# Patient Record
Sex: Female | Born: 1945 | Race: White | Hispanic: No | Marital: Married | Smoking: Never smoker
Health system: Southern US, Community
[De-identification: ages and names within clinical notes are randomized; demographics above are authoritative.]

## PROBLEM LIST (undated history)

## (undated) DIAGNOSIS — J019 Acute sinusitis, unspecified: Secondary | ICD-10-CM

## (undated) DIAGNOSIS — F419 Anxiety disorder, unspecified: Secondary | ICD-10-CM

## (undated) DIAGNOSIS — N63 Unspecified lump in unspecified breast: Secondary | ICD-10-CM

## (undated) DIAGNOSIS — E663 Overweight: Secondary | ICD-10-CM

## (undated) DIAGNOSIS — E039 Hypothyroidism, unspecified: Secondary | ICD-10-CM

## (undated) DIAGNOSIS — D126 Benign neoplasm of colon, unspecified: Secondary | ICD-10-CM

## (undated) DIAGNOSIS — J069 Acute upper respiratory infection, unspecified: Secondary | ICD-10-CM

## (undated) DIAGNOSIS — H269 Unspecified cataract: Secondary | ICD-10-CM

## (undated) DIAGNOSIS — E785 Hyperlipidemia, unspecified: Secondary | ICD-10-CM

## (undated) DIAGNOSIS — K5730 Diverticulosis of large intestine without perforation or abscess without bleeding: Secondary | ICD-10-CM

## (undated) DIAGNOSIS — R05 Cough: Secondary | ICD-10-CM

## (undated) DIAGNOSIS — F341 Dysthymic disorder: Secondary | ICD-10-CM

## (undated) DIAGNOSIS — K589 Irritable bowel syndrome without diarrhea: Secondary | ICD-10-CM

## (undated) DIAGNOSIS — I10 Essential (primary) hypertension: Secondary | ICD-10-CM

## (undated) HISTORY — DX: Anxiety disorder, unspecified: F41.9

## (undated) HISTORY — DX: Hypothyroidism, unspecified: E03.9

## (undated) HISTORY — DX: Benign neoplasm of colon, unspecified: D12.6

## (undated) HISTORY — DX: Unspecified cataract: H26.9

## (undated) HISTORY — DX: Dysthymic disorder: F34.1

## (undated) HISTORY — PX: TONSILLECTOMY: SHX5217

## (undated) HISTORY — DX: Hyperlipidemia, unspecified: E78.5

## (undated) HISTORY — PX: EYE SURGERY: SHX253

## (undated) HISTORY — DX: Irritable bowel syndrome without diarrhea: K58.9

## (undated) HISTORY — PX: ABDOMINAL HYSTERECTOMY: SHX81

## (undated) HISTORY — DX: Diverticulosis of large intestine without perforation or abscess without bleeding: K57.30

## (undated) HISTORY — DX: Essential (primary) hypertension: I10

## (undated) HISTORY — DX: Overweight: E66.3

## (undated) HISTORY — DX: Acute sinusitis, unspecified: J01.90

## (undated) HISTORY — DX: Cough: R05

## (undated) HISTORY — DX: Acute upper respiratory infection, unspecified: J06.9

## (undated) HISTORY — DX: Unspecified lump in unspecified breast: N63.0

---

## 1970-10-14 HISTORY — PX: LAPAROSCOPY: SHX197

## 1975-10-15 HISTORY — PX: CERVICAL CONE BIOPSY: SUR198

## 1982-10-14 HISTORY — PX: BREAST SURGERY: SHX581

## 1982-10-14 HISTORY — PX: BREAST EXCISIONAL BIOPSY: SUR124

## 1998-08-30 ENCOUNTER — Other Ambulatory Visit: Admission: RE | Admit: 1998-08-30 | Discharge: 1998-08-30 | Payer: Self-pay | Admitting: *Deleted

## 1999-09-05 ENCOUNTER — Other Ambulatory Visit: Admission: RE | Admit: 1999-09-05 | Discharge: 1999-09-05 | Payer: Self-pay | Admitting: *Deleted

## 2000-05-23 ENCOUNTER — Encounter: Admission: RE | Admit: 2000-05-23 | Discharge: 2000-05-23 | Payer: Self-pay | Admitting: Internal Medicine

## 2000-05-23 ENCOUNTER — Encounter: Payer: Self-pay | Admitting: Internal Medicine

## 2000-09-17 ENCOUNTER — Other Ambulatory Visit: Admission: RE | Admit: 2000-09-17 | Discharge: 2000-09-17 | Payer: Self-pay | Admitting: *Deleted

## 2000-11-19 ENCOUNTER — Encounter: Payer: Self-pay | Admitting: Otolaryngology

## 2000-11-19 ENCOUNTER — Encounter: Admission: RE | Admit: 2000-11-19 | Discharge: 2000-11-19 | Payer: Self-pay | Admitting: Otolaryngology

## 2001-05-25 ENCOUNTER — Encounter: Payer: Self-pay | Admitting: Internal Medicine

## 2001-05-25 ENCOUNTER — Encounter: Admission: RE | Admit: 2001-05-25 | Discharge: 2001-05-25 | Payer: Self-pay | Admitting: Internal Medicine

## 2001-10-09 ENCOUNTER — Other Ambulatory Visit: Admission: RE | Admit: 2001-10-09 | Discharge: 2001-10-09 | Payer: Self-pay | Admitting: *Deleted

## 2002-04-12 ENCOUNTER — Ambulatory Visit (HOSPITAL_COMMUNITY): Admission: RE | Admit: 2002-04-12 | Discharge: 2002-04-12 | Payer: Self-pay | Admitting: Gastroenterology

## 2002-04-12 ENCOUNTER — Encounter: Payer: Self-pay | Admitting: Gastroenterology

## 2002-06-30 ENCOUNTER — Encounter: Payer: Self-pay | Admitting: Internal Medicine

## 2002-06-30 ENCOUNTER — Encounter: Admission: RE | Admit: 2002-06-30 | Discharge: 2002-06-30 | Payer: Self-pay | Admitting: Internal Medicine

## 2002-10-18 ENCOUNTER — Other Ambulatory Visit: Admission: RE | Admit: 2002-10-18 | Discharge: 2002-10-18 | Payer: Self-pay | Admitting: *Deleted

## 2003-02-13 ENCOUNTER — Encounter: Payer: Self-pay | Admitting: Orthopaedic Surgery

## 2003-02-13 ENCOUNTER — Encounter: Admission: RE | Admit: 2003-02-13 | Discharge: 2003-02-13 | Payer: Self-pay | Admitting: Orthopaedic Surgery

## 2003-07-04 ENCOUNTER — Encounter: Payer: Self-pay | Admitting: Internal Medicine

## 2003-07-04 ENCOUNTER — Encounter: Admission: RE | Admit: 2003-07-04 | Discharge: 2003-07-04 | Payer: Self-pay | Admitting: Internal Medicine

## 2003-10-20 ENCOUNTER — Other Ambulatory Visit: Admission: RE | Admit: 2003-10-20 | Discharge: 2003-10-20 | Payer: Self-pay | Admitting: *Deleted

## 2003-12-14 ENCOUNTER — Encounter: Admission: RE | Admit: 2003-12-14 | Discharge: 2003-12-14 | Payer: Self-pay | Admitting: *Deleted

## 2004-07-05 ENCOUNTER — Encounter: Admission: RE | Admit: 2004-07-05 | Discharge: 2004-07-05 | Payer: Self-pay | Admitting: Internal Medicine

## 2004-10-01 ENCOUNTER — Ambulatory Visit: Payer: Self-pay | Admitting: Internal Medicine

## 2004-10-14 HISTORY — PX: SKIN LESION EXCISION: SHX2412

## 2004-10-25 ENCOUNTER — Other Ambulatory Visit: Admission: RE | Admit: 2004-10-25 | Discharge: 2004-10-25 | Payer: Self-pay | Admitting: *Deleted

## 2005-04-12 ENCOUNTER — Ambulatory Visit: Payer: Self-pay | Admitting: Internal Medicine

## 2005-04-18 ENCOUNTER — Ambulatory Visit: Payer: Self-pay | Admitting: Internal Medicine

## 2005-07-24 ENCOUNTER — Encounter: Admission: RE | Admit: 2005-07-24 | Discharge: 2005-07-24 | Payer: Self-pay | Admitting: Internal Medicine

## 2005-08-05 ENCOUNTER — Encounter: Admission: RE | Admit: 2005-08-05 | Discharge: 2005-08-05 | Payer: Self-pay | Admitting: Internal Medicine

## 2005-08-19 ENCOUNTER — Ambulatory Visit: Payer: Self-pay | Admitting: Internal Medicine

## 2005-10-28 ENCOUNTER — Other Ambulatory Visit: Admission: RE | Admit: 2005-10-28 | Discharge: 2005-10-28 | Payer: Self-pay | Admitting: Obstetrics & Gynecology

## 2005-10-28 ENCOUNTER — Other Ambulatory Visit: Admission: RE | Admit: 2005-10-28 | Discharge: 2005-10-28 | Payer: Self-pay | Admitting: Obstetrics and Gynecology

## 2005-11-12 ENCOUNTER — Ambulatory Visit: Payer: Self-pay | Admitting: Internal Medicine

## 2005-12-07 ENCOUNTER — Ambulatory Visit: Payer: Self-pay | Admitting: Family Medicine

## 2006-01-14 ENCOUNTER — Encounter: Admission: RE | Admit: 2006-01-14 | Discharge: 2006-01-14 | Payer: Self-pay | Admitting: Internal Medicine

## 2006-05-21 ENCOUNTER — Ambulatory Visit: Payer: Self-pay | Admitting: Internal Medicine

## 2006-05-27 ENCOUNTER — Ambulatory Visit: Payer: Self-pay | Admitting: Internal Medicine

## 2006-07-25 ENCOUNTER — Encounter: Admission: RE | Admit: 2006-07-25 | Discharge: 2006-07-25 | Payer: Self-pay | Admitting: Internal Medicine

## 2006-10-30 ENCOUNTER — Other Ambulatory Visit: Admission: RE | Admit: 2006-10-30 | Discharge: 2006-10-30 | Payer: Self-pay | Admitting: Obstetrics and Gynecology

## 2006-12-13 ENCOUNTER — Ambulatory Visit: Payer: Self-pay | Admitting: Internal Medicine

## 2007-03-31 ENCOUNTER — Ambulatory Visit: Payer: Self-pay | Admitting: Gastroenterology

## 2007-04-01 ENCOUNTER — Ambulatory Visit: Payer: Self-pay | Admitting: Gastroenterology

## 2007-04-01 ENCOUNTER — Encounter: Payer: Self-pay | Admitting: Gastroenterology

## 2007-05-26 ENCOUNTER — Ambulatory Visit: Payer: Self-pay | Admitting: Internal Medicine

## 2007-05-26 LAB — CONVERTED CEMR LAB
Basophils Absolute: 0.1 10*3/uL (ref 0.0–0.1)
Bilirubin Urine: NEGATIVE
CO2: 28 meq/L (ref 19–32)
Calcium: 9 mg/dL (ref 8.4–10.5)
GFR calc Af Amer: 95 mL/min
HDL: 40.1 mg/dL (ref >39.0)
Hemoglobin, Urine: NEGATIVE
Hemoglobin: 14.2 g/dL (ref 12.0–15.0)
Ketones, ur: NEGATIVE mg/dL
MCHC: 35.4 g/dL (ref 30.0–36.0)
Monocytes Absolute: 0.6 10*3/uL (ref 0.2–0.7)
Neutro Abs: 3.3 10*3/uL (ref 1.4–7.7)
Neutrophils Relative %: 42.4 % — ABNORMAL LOW (ref 43.0–77.0)
Nitrite: NEGATIVE
Sodium: 144 meq/L (ref 135–145)
Specific Gravity, Urine: 1.02 (ref 1.000–1.03)
TSH: 0.52 microintl units/mL (ref 0.35–5.50)
Total Bilirubin: 0.6 mg/dL (ref 0.3–1.2)
Total CHOL/HDL Ratio: 3.5
Total Protein: 6.5 g/dL (ref 6.0–8.3)
Urine Glucose: NEGATIVE mg/dL
Urobilinogen, UA: 0.2 (ref 0.0–1.0)
VLDL: 31 mg/dL (ref 0–40)
pH: 6 (ref 5.0–8.0)

## 2007-05-29 ENCOUNTER — Ambulatory Visit: Payer: Self-pay | Admitting: Internal Medicine

## 2007-07-27 ENCOUNTER — Encounter: Admission: RE | Admit: 2007-07-27 | Discharge: 2007-07-27 | Payer: Self-pay | Admitting: Internal Medicine

## 2007-10-02 ENCOUNTER — Ambulatory Visit: Payer: Self-pay | Admitting: Internal Medicine

## 2007-10-15 LAB — CONVERTED CEMR LAB

## 2007-11-02 ENCOUNTER — Other Ambulatory Visit: Admission: RE | Admit: 2007-11-02 | Discharge: 2007-11-02 | Payer: Self-pay | Admitting: Obstetrics & Gynecology

## 2007-11-30 ENCOUNTER — Telehealth: Payer: Self-pay | Admitting: Internal Medicine

## 2008-01-05 ENCOUNTER — Ambulatory Visit: Payer: Self-pay | Admitting: Internal Medicine

## 2008-01-18 ENCOUNTER — Ambulatory Visit: Payer: Self-pay | Admitting: Internal Medicine

## 2008-02-06 DIAGNOSIS — K589 Irritable bowel syndrome without diarrhea: Secondary | ICD-10-CM

## 2008-02-06 DIAGNOSIS — N63 Unspecified lump in unspecified breast: Secondary | ICD-10-CM

## 2008-02-06 DIAGNOSIS — I10 Essential (primary) hypertension: Secondary | ICD-10-CM

## 2008-02-06 DIAGNOSIS — F341 Dysthymic disorder: Secondary | ICD-10-CM

## 2008-02-06 DIAGNOSIS — K5730 Diverticulosis of large intestine without perforation or abscess without bleeding: Secondary | ICD-10-CM

## 2008-02-06 DIAGNOSIS — F329 Major depressive disorder, single episode, unspecified: Secondary | ICD-10-CM

## 2008-02-06 DIAGNOSIS — E039 Hypothyroidism, unspecified: Secondary | ICD-10-CM

## 2008-02-06 DIAGNOSIS — F32A Depression, unspecified: Secondary | ICD-10-CM | POA: Insufficient documentation

## 2008-02-06 HISTORY — DX: Hypothyroidism, unspecified: E03.9

## 2008-02-06 HISTORY — DX: Essential (primary) hypertension: I10

## 2008-02-06 HISTORY — DX: Dysthymic disorder: F34.1

## 2008-02-06 HISTORY — DX: Irritable bowel syndrome, unspecified: K58.9

## 2008-02-06 HISTORY — DX: Diverticulosis of large intestine without perforation or abscess without bleeding: K57.30

## 2008-02-06 HISTORY — DX: Unspecified lump in unspecified breast: N63.0

## 2008-06-21 ENCOUNTER — Telehealth: Payer: Self-pay | Admitting: Internal Medicine

## 2008-06-21 ENCOUNTER — Ambulatory Visit: Payer: Self-pay | Admitting: Internal Medicine

## 2008-06-21 LAB — CONVERTED CEMR LAB
Basophils Absolute: 0 10*3/uL (ref 0.0–0.1)
Basophils Relative: 0.6 % (ref 0.0–3.0)
CO2: 29 meq/L (ref 19–32)
Chloride: 108 meq/L (ref 96–112)
Eosinophils Absolute: 0.1 10*3/uL (ref 0.0–0.7)
Eosinophils Relative: 1.6 % (ref 0.0–5.0)
GFR calc non Af Amer: 68 mL/min
HCT: 41.9 % (ref 36.0–46.0)
Hemoglobin, Urine: NEGATIVE
Hemoglobin: 14.9 g/dL (ref 12.0–15.0)
LDL Cholesterol: 60 mg/dL (ref 0–99)
MCV: 89.2 fL (ref 78.0–100.0)
Neutro Abs: 4.8 10*3/uL (ref 1.4–7.7)
Neutrophils Relative %: 55.9 % (ref 43.0–77.0)
Nitrite: NEGATIVE
Potassium: 3.6 meq/L (ref 3.5–5.1)
RBC: 4.7 M/uL (ref 3.87–5.11)
RDW: 13.6 % (ref 11.5–14.6)
Sodium: 145 meq/L (ref 135–145)
Total Bilirubin: 1 mg/dL (ref 0.3–1.2)
Urine Glucose: NEGATIVE mg/dL
Urobilinogen, UA: 0.2 (ref 0.0–1.0)
pH: 6 (ref 5.0–8.0)

## 2008-06-27 ENCOUNTER — Ambulatory Visit: Payer: Self-pay | Admitting: Internal Medicine

## 2008-06-27 DIAGNOSIS — E663 Overweight: Secondary | ICD-10-CM

## 2008-06-27 DIAGNOSIS — E785 Hyperlipidemia, unspecified: Secondary | ICD-10-CM | POA: Insufficient documentation

## 2008-06-27 DIAGNOSIS — E669 Obesity, unspecified: Secondary | ICD-10-CM | POA: Insufficient documentation

## 2008-06-27 HISTORY — DX: Overweight: E66.3

## 2008-06-27 HISTORY — DX: Hyperlipidemia, unspecified: E78.5

## 2008-07-27 ENCOUNTER — Encounter: Admission: RE | Admit: 2008-07-27 | Discharge: 2008-07-27 | Payer: Self-pay | Admitting: Internal Medicine

## 2008-09-27 ENCOUNTER — Ambulatory Visit: Payer: Self-pay | Admitting: Internal Medicine

## 2008-09-27 DIAGNOSIS — R059 Cough, unspecified: Secondary | ICD-10-CM | POA: Insufficient documentation

## 2008-09-27 DIAGNOSIS — R05 Cough: Secondary | ICD-10-CM

## 2008-09-27 HISTORY — DX: Cough, unspecified: R05.9

## 2008-10-14 LAB — CONVERTED CEMR LAB: Pap Smear: NORMAL

## 2008-11-10 ENCOUNTER — Other Ambulatory Visit: Admission: RE | Admit: 2008-11-10 | Discharge: 2008-11-10 | Payer: Self-pay | Admitting: Obstetrics and Gynecology

## 2008-12-08 ENCOUNTER — Telehealth: Payer: Self-pay | Admitting: Internal Medicine

## 2009-03-29 ENCOUNTER — Telehealth: Payer: Self-pay | Admitting: Internal Medicine

## 2009-04-03 ENCOUNTER — Encounter: Admission: RE | Admit: 2009-04-03 | Discharge: 2009-04-03 | Payer: Self-pay | Admitting: Obstetrics & Gynecology

## 2009-05-30 ENCOUNTER — Telehealth: Payer: Self-pay | Admitting: Internal Medicine

## 2009-05-31 ENCOUNTER — Ambulatory Visit: Payer: Self-pay | Admitting: Internal Medicine

## 2009-06-21 ENCOUNTER — Ambulatory Visit: Payer: Self-pay | Admitting: Internal Medicine

## 2009-06-21 LAB — CONVERTED CEMR LAB
ALT: 14 units/L (ref 0–35)
AST: 23 units/L (ref 0–37)
Albumin: 3.8 g/dL (ref 3.5–5.2)
BUN: 16 mg/dL (ref 6–23)
Basophils Absolute: 0.1 10*3/uL (ref 0.0–0.1)
Basophils Relative: 1.2 % (ref 0.0–3.0)
Bilirubin, Direct: 0.1 mg/dL (ref 0.0–0.3)
Calcium: 9.3 mg/dL (ref 8.4–10.5)
Cholesterol: 145 mg/dL (ref 0–200)
Eosinophils Absolute: 0.2 10*3/uL (ref 0.0–0.7)
Glucose, Bld: 92 mg/dL (ref 70–99)
HDL: 46.3 mg/dL (ref >39.00)
Hemoglobin, Urine: NEGATIVE
Ketones, ur: NEGATIVE mg/dL
Lymphs Abs: 3.2 10*3/uL (ref 0.7–4.0)
MCHC: 33.7 g/dL (ref 30.0–36.0)
MCV: 93.2 fL (ref 78.0–100.0)
Monocytes Absolute: 0.6 10*3/uL (ref 0.1–1.0)
Neutrophils Relative %: 49.2 % (ref 43.0–77.0)
Nitrite: NEGATIVE
TSH: 2.18 microintl units/mL (ref 0.35–5.50)
Total Bilirubin: 0.9 mg/dL (ref 0.3–1.2)
Total CHOL/HDL Ratio: 3
Total Protein, Urine: NEGATIVE mg/dL
Urobilinogen, UA: 0.2 (ref 0.0–1.0)
Vitamin B-12: 431 pg/mL (ref 211–911)
WBC: 8.2 10*3/uL (ref 4.5–10.5)
pH: 6 (ref 5.0–8.0)

## 2009-06-23 ENCOUNTER — Ambulatory Visit: Payer: Self-pay | Admitting: Internal Medicine

## 2009-06-29 ENCOUNTER — Ambulatory Visit: Payer: Self-pay | Admitting: Internal Medicine

## 2009-07-31 ENCOUNTER — Encounter: Admission: RE | Admit: 2009-07-31 | Discharge: 2009-07-31 | Payer: Self-pay | Admitting: Internal Medicine

## 2009-09-04 ENCOUNTER — Ambulatory Visit: Payer: Self-pay | Admitting: Internal Medicine

## 2009-09-04 DIAGNOSIS — J069 Acute upper respiratory infection, unspecified: Secondary | ICD-10-CM | POA: Insufficient documentation

## 2009-09-04 HISTORY — DX: Acute upper respiratory infection, unspecified: J06.9

## 2009-11-20 ENCOUNTER — Telehealth: Payer: Self-pay | Admitting: Internal Medicine

## 2010-01-04 ENCOUNTER — Ambulatory Visit: Payer: Self-pay | Admitting: Internal Medicine

## 2010-01-04 DIAGNOSIS — J019 Acute sinusitis, unspecified: Secondary | ICD-10-CM

## 2010-01-04 HISTORY — DX: Acute sinusitis, unspecified: J01.90

## 2010-02-06 ENCOUNTER — Ambulatory Visit: Payer: Self-pay | Admitting: Internal Medicine

## 2010-02-13 ENCOUNTER — Encounter (INDEPENDENT_AMBULATORY_CARE_PROVIDER_SITE_OTHER): Payer: Self-pay | Admitting: Obstetrics and Gynecology

## 2010-02-13 ENCOUNTER — Encounter: Payer: Self-pay | Admitting: Internal Medicine

## 2010-02-13 ENCOUNTER — Inpatient Hospital Stay (HOSPITAL_COMMUNITY): Admission: RE | Admit: 2010-02-13 | Discharge: 2010-02-14 | Payer: Self-pay | Admitting: Obstetrics and Gynecology

## 2010-02-19 ENCOUNTER — Telehealth: Payer: Self-pay | Admitting: Internal Medicine

## 2010-03-01 ENCOUNTER — Encounter (INDEPENDENT_AMBULATORY_CARE_PROVIDER_SITE_OTHER): Payer: Self-pay | Admitting: *Deleted

## 2010-03-06 ENCOUNTER — Telehealth: Payer: Self-pay | Admitting: Gastroenterology

## 2010-03-15 ENCOUNTER — Telehealth: Payer: Self-pay | Admitting: Gastroenterology

## 2010-03-16 ENCOUNTER — Telehealth: Payer: Self-pay | Admitting: Internal Medicine

## 2010-03-16 DIAGNOSIS — D126 Benign neoplasm of colon, unspecified: Secondary | ICD-10-CM | POA: Insufficient documentation

## 2010-03-16 HISTORY — DX: Benign neoplasm of colon, unspecified: D12.6

## 2010-04-05 ENCOUNTER — Encounter: Payer: Self-pay | Admitting: Internal Medicine

## 2010-05-11 ENCOUNTER — Encounter: Payer: Self-pay | Admitting: Internal Medicine

## 2010-05-15 ENCOUNTER — Telehealth: Payer: Self-pay | Admitting: Internal Medicine

## 2010-05-22 ENCOUNTER — Telehealth: Payer: Self-pay | Admitting: Internal Medicine

## 2010-06-22 ENCOUNTER — Ambulatory Visit: Payer: Self-pay | Admitting: Internal Medicine

## 2010-06-22 LAB — CONVERTED CEMR LAB
Alkaline Phosphatase: 66 units/L (ref 39–117)
Cholesterol: 147 mg/dL (ref 0–200)
HDL: 40.6 mg/dL (ref >39.00)
TSH: 0.44 microintl units/mL (ref 0.35–5.50)
Total Protein: 7.1 g/dL (ref 6.0–8.3)
VLDL: 24.2 mg/dL (ref 0.0–40.0)

## 2010-06-24 ENCOUNTER — Encounter: Payer: Self-pay | Admitting: Internal Medicine

## 2010-08-01 ENCOUNTER — Encounter: Admission: RE | Admit: 2010-08-01 | Discharge: 2010-08-01 | Payer: Self-pay | Admitting: Internal Medicine

## 2010-08-21 ENCOUNTER — Ambulatory Visit: Payer: Self-pay | Admitting: Internal Medicine

## 2010-11-09 ENCOUNTER — Telehealth: Payer: Self-pay | Admitting: Internal Medicine

## 2010-11-15 NOTE — Progress Notes (Signed)
    Preventive Care Screening  Colonoscopy:    Date:  05/11/2010    Next Due:  05/2015    Results:  Adenomatous Polyp

## 2010-11-15 NOTE — Assessment & Plan Note (Signed)
Summary: SINUS PROBLEM  STC   Vital Signs:  Patient profile:   65 year old female Height:      62 inches Weight:      189 pounds BMI:     34.69 O2 Sat:      98 % on Room air Temp:     97.8 degrees F oral Pulse rate:   82 / minute BP sitting:   122 / 80  (left arm) Cuff size:   regular  Vitals Entered By: Alysia Penna (June 22, 2010 3:59 PM)  O2 Flow:  Room air CC: pt c/o sinus problems with chest congestion x 1 wk. /cp sma   Primary Care [place]:  [facility]  CC:  pt c/o sinus problems with chest congestion x 1 wk. /cp sma.  History of Present Illness: Having sinus congestion, green mucus, cough, sore throat, mild SOB. She has been takng otc meds for 1 week.   In the interval since her last visit she has had an abdominal hysterectomy which was uneventful Duard Larsen). she has had colonoscopy with Dr. Madilyn Fireman which went well  Current Medications (verified): 1)  Alprazolam 0.5 Mg Tabs (Alprazolam) .... Take 1 Tablet By Mouth Three Times A Day 2)  Bupropion Hcl 75 Mg Tabs (Bupropion Hcl) .... Take 1 Tablet By Mouth Twice A Day 3)  Hydrochlorothiazide 25 Mg Tabs (Hydrochlorothiazide) .... Take 1 Tablet By Mouth Once A Day 4)  Hyoscyamine Sulfate 0.125 Mg Subl (Hyoscyamine Sulfate) .... Take 1 Tablet By Mouth Twice A Day 5)  Lovastatin 40 Mg Tabs (Lovastatin) .... Take 1 Tablet By Mouth Every Night 6)  Naproxen 500 Mg Tabs (Naproxen) .... Take 1 Tablet By Mouth Twice A Day 7)  Synthroid 88 Mcg Tabs (Levothyroxine Sodium) .... Take 1 Tablet By Mouth Once A Day 8)  Fluticasone Propionate 50 Mcg/act Susp (Fluticasone Propionate) .Marland Kitchen.. 1 Spray / Nares Once A Day 9)  Adult Aspirin Low Strength 81 Mg  Tbdp (Aspirin) .... Take 1 Tablet By Mouth Once A Day 10)  Premarin 0.625 Mg/gm Crea (Estrogens, Conjugated)  Allergies (verified): No Known Drug Allergies  Past History:  Past Medical History: Last updated: 06/27/2008 DIVERTICULOSIS OF COLON (ICD-562.10) LUMP OR MASS IN  BREAST (ICD-611.72) HYPERTENSION (ICD-401.9) HYPOTHYROIDISM (ICD-244.9) ANXIETY DEPRESSION (ICD-300.4) IRRITABLE BOWEL SYNDROME (ICD-564.1) Congenital absence left kidney  Past Surgical History: Lumpectomy '84 right - benign Tonsillectomy laparoscopy '72 - fertility work-up Conization '77 excision of skin lesiion left breast '06 abdominal TAH/BSO May '11 (Brooks Silva)-fibroids, leiomyomata. PSH reviewed for relevance, FH reviewed for relevance  Review of Systems       The patient complains of fever, hoarseness, and prolonged cough.  The patient denies anorexia, weight loss, weight gain, decreased hearing, chest pain, dyspnea on exertion, abdominal pain, difficulty walking, and enlarged lymph nodes.    Physical Exam  General:  overweight white female in no distress Head:  normocephalic and atraumatic.  Minor sinus tenderness. Eyes:  C&S clear Ears:  TMs normal Neck:  supple.   Lungs:  normal respiratory effort, no intercostal retractions, no accessory muscle use, normal breath sounds, no crackles, and no wheezes.   Heart:  normal rate and regular rhythm.   Abdomen:  soft and normal bowel sounds.   Neurologic:  alert & oriented X3, cranial nerves II-XII intact, strength normal in all extremities, and gait normal.   Skin:  turgor normal and color normal.   Cervical Nodes:  no anterior cervical adenopathy and no posterior cervical adenopathy.  Psych:  normally interactive and good eye contact.     Impression & Recommendations:  Problem # 1:  SINUSITIS- ACUTE-NOS (ICD-461.9) Mild sinusitis.  Plan - Augmentin two times a day           supportive care.   The following medications were removed from the medication list:    Augmentin 875-125 Mg Tabs (Amoxicillin-pot clavulanate) .Marland Kitchen... Generic - 1 by mouth two times a day Her updated medication list for this problem includes:    Fluticasone Propionate 50 Mcg/act Susp (Fluticasone propionate) .Marland Kitchen... 1 spray / nares once a day     Amoxicillin-pot Clavulanate 875-125 Mg Tabs (Amoxicillin-pot clavulanate) .Marland Kitchen... 1 by mouth two times a day x 7 days for sinusitis  Problem # 2:  HYPERLIPIDEMIA (ICD-272.4) Due for routine lab follow-up with recommendations to follow under seperate cover.   Her updated medication list for this problem includes:    Lovastatin 40 Mg Tabs (Lovastatin) .Marland Kitchen... Take 1 tablet by mouth every night  Orders: TLB-Lipid Panel (80061-LIPID) TLB-Hepatic/Liver Function Pnl (80076-HEPATIC)  Complete Medication List: 1)  Alprazolam 0.5 Mg Tabs (Alprazolam) .... Take 1 tablet by mouth three times a day 2)  Bupropion Hcl 75 Mg Tabs (Bupropion hcl) .... Take 1 tablet by mouth twice a day 3)  Hydrochlorothiazide 25 Mg Tabs (Hydrochlorothiazide) .... Take 1 tablet by mouth once a day 4)  Hyoscyamine Sulfate 0.125 Mg Subl (Hyoscyamine sulfate) .... Take 1 tablet by mouth twice a day 5)  Lovastatin 40 Mg Tabs (Lovastatin) .... Take 1 tablet by mouth every night 6)  Naproxen 500 Mg Tabs (Naproxen) .... Take 1 tablet by mouth twice a day 7)  Synthroid 88 Mcg Tabs (Levothyroxine sodium) .... Take 1 tablet by mouth once a day 8)  Fluticasone Propionate 50 Mcg/act Susp (Fluticasone propionate) .Marland Kitchen.. 1 spray / nares once a day 9)  Adult Aspirin Low Strength 81 Mg Tbdp (Aspirin) .... Take 1 tablet by mouth once a day 10)  Premarin 0.625 Mg/gm Crea (Estrogens, conjugated) 11)  Amoxicillin-pot Clavulanate 875-125 Mg Tabs (Amoxicillin-pot clavulanate) .Marland Kitchen.. 1 by mouth two times a day x 7 days for sinusitis  Other Orders: TLB-TSH (Thyroid Stimulating Hormone) (84443-TSH)  Patient Instructions: 1)  sinus infection - plan: augmentin two times a day x 7 days; sudafed (generic) 30mg  three times a day; nasal saline; hydrate; vitamin C.  Prescriptions: FLUTICASONE PROPIONATE 50 MCG/ACT SUSP (FLUTICASONE PROPIONATE) 1 spray / nares once a day  #3 x 3   Entered and Authorized by:   Jacques Navy MD   Signed by:   Jacques Navy MD on 06/22/2010   Method used:   [facility] [phone]* (retail)       [address]       [place], [place]  [address]       Ph: [phone]       Fax: [phone]   RxID:   [id number] SYNTHROID 88 MCG TABS (LEVOTHYROXINE SODIUM) Take 1 tablet by mouth once a day  #90 x 3   Entered and Authorized by:   Jacques Navy MD   Signed by:   Jacques Navy MD on 06/22/2010   Method used:   [facility]ronically to        [facility] [phone]* (retail)       [address]       [place]  [place], [place]  [address]       Ph: [phone]       Fax: [phone]   RxID:   [id number] LOVASTATIN 40 MG TABS (LOVASTATIN) Take 1 tablet by mouth every night  #90 x 3   Entered and Authorized by:   Jacques Navy MD   Signed by:   Jacques Navy MD on 06/22/2010   Method used:   Electronically to        [facility] [phone]* (retail)       [address]       [place], [place]  [address]       Ph: [phone]       Fax: [phone]   RxID:   [id number] HYOSCYAMINE SULFATE 0.125 MG SUBL (HYOSCYAMINE SULFATE) Take 1 tablet by mouth twice a day  #180 x 3   Entered and Authorized by:   Jacques Navy MD   Signed by:   Jacques Navy MD on 06/22/2010   Method used:   Electronically to        [facility] [phone]* (retail)       [address]       [place], [place]  [address]       Ph: [phone]       Fax: [phone]   RxID:   [id number] HYDROCHLOROTHIAZIDE 25 MG TABS (HYDROCHLOROTHIAZIDE) Take 1 tablet by mouth once a day  #90 x 3   Entered and Authorized by:   Jacques Navy MD   Signed by:   Jacques Navy MD on 06/22/2010   Method used:   Electronically to        [facility] [phone]* (retail)       [address]       [place], [place]  [address]       Ph: [phone]       Fax: [phone]    RxID:   [id number] BUPROPION HCL 75 MG TABS (BUPROPION HCL) Take 1 tablet by mouth twice a day  #180 x 3   Entered and Authorized by:   Jacques Navy MD   Signed by:   Jacques Navy MD on 06/22/2010   Method used:   Electronically to        [facility] [phone]* (retail)       [address]       [place], [place]  [address]       Ph: [phone]       Fax: [phone]   RxID:   [id number] AMOXICILLIN-POT CLAVULANATE 875-125 MG TABS (AMOXICILLIN-POT CLAVULANATE) 1 by mouth two times a day x 7 days for sinusitis  #14 x 0   Entered and Authorized by:   Jacques Navy MD   Signed by:   Jacques Navy MD on 06/22/2010   Method used:   Electronically to        [facility] [phone]* (retail)       [address]       [place], [place]  [address]       Ph: [phone]       Fax: [phone]   RxID:   [id number]

## 2010-11-15 NOTE — Progress Notes (Signed)
  Phone Note Refill Request Message from:  Fax from Pharmacy on May 15, 2010 11:47 AM  Refills Requested: Medication #1:  ALPRAZOLAM 0.5 MG TABS Take 1 tablet by mouth three times a day   Last Refilled: 04/19/2010 [facility] please Advise refill  Initial call taken by: Ami Bullins CMA,  May 15, 2010 11:48 AM  Follow-up for Phone Call        ok for efill x 5 Follow-up by: Jacques Navy MD,  May 15, 2010 12:42 PM    Prescriptions: ALPRAZOLAM 0.5 MG TABS (ALPRAZOLAM) Take 1 tablet by mouth three times a day  #90 x 5   Entered by:   Ami Bullins CMA   Authorized by:   Jacques Navy MD   Signed by:   Bill Salinas CMA on 05/15/2010   Method used:   Telephoned to ...       [facility] [phone]* (retail)       [address]       [place], [place]  [address]       Ph: [phone]       Fax: [phone]   RxID:   [id number]

## 2010-11-15 NOTE — Procedures (Signed)
Summary: Colonoscopy / [facility]  Colonoscopy / [facility]   Imported By: Lennie Odor 05/25/2010 11:05:40  _____________________________________________________________________  External Attachment:    Type:   Image     Comment:   External Document

## 2010-11-15 NOTE — Letter (Signed)
Summary: Colonoscopy Letter   Gastroenterology  [address] [place], [place] [address]   Phone: [phone]  Fax: [phone]      Mar 01, 2010 MRN: [id number]   Brandi Ferguson [address] [place], [place]  [address]   Dear [name],   According to your medical record, it is time for you to schedule a Colonoscopy. The American Cancer Society recommends this procedure as a method to detect early colon cancer. Patients with a family history of colon cancer, or a personal history of colon polyps or inflammatory bowel disease are at increased risk.  This letter has beeen generated based on the recommendations made at the time of your procedure. If you feel that in your particular situation this may no longer apply, please contact our office.  Please call our office at [phone] to schedule this appointment or to update your records at your earliest convenience.  Thank you for cooperating with [place] to provide you with the very best care possible.   Sincerely,    Vania Rea. Jarold Motto, M.D.  [facility]astroenterology [facility] [phone]

## 2010-11-15 NOTE — Assessment & Plan Note (Signed)
Summary: SINUS INFECTION / SORE THROAT /NWS   Vital Signs:  Patient profile:   65 year old female Height:      62 inches Weight:      188 pounds BMI:     34.51 O2 Sat:      97 % on Room air Temp:     97.7 degrees F oral Pulse rate:   81 / minute BP sitting:   120 / 72  (left arm) Cuff size:   regular  Vitals Entered ByZella Ball Ewing (January 04, 2010 3:32 PM)  O2 Flow:  Room air CC: Sinus infection, sore throat, cough/RE   Primary Care Provider:  [facility]  CC:  Sinus infection, sore throat, and cough/RE.  History of Present Illness: here wtih 4 days onset facial pain, pressure, fever and greenish d/c, with mild ST, but Pt denies CP, sob, doe, wheezing, orthopnea, pnd, worsening LE edema, palps, dizziness or syncope Has mild headache but Pt denies new neuro symptoms such as headache, facial or extremity weakness   Problems Prior to Update: 1)  Uri  (ICD-465.9) 2)  Cough  (ICD-786.2) 3)  Routine General Medical Exam@[facility]  (ICD-V70.0) 4)  Hyperlipidemia  (ICD-272.4) 5)  Overweight  (ICD-278.02) 6)  Diverticulosis of Colon  (ICD-562.10) 7)  Lump or Mass in Breast  (ICD-611.72) 8)  Hypertension  (ICD-401.9) 9)  Hypothyroidism  (ICD-244.9) 10)  Anxiety Depression  (ICD-300.4) 11)  Irritable Bowel Syndrome  (ICD-564.1)  Medications Prior to Update: 1)  Alprazolam 0.5 Mg Tabs (Alprazolam) .... Take 1 Tablet By Mouth Three Times A Day 2)  Bupropion Hcl 75 Mg Tabs (Bupropion Hcl) .... Take 1 Tablet By Mouth Twice A Day 3)  Hydrochlorothiazide 25 Mg Tabs (Hydrochlorothiazide) .... Take 1 Tablet By Mouth Once A Day 4)  Hyoscyamine Sulfate 0.125 Mg Subl (Hyoscyamine Sulfate) .... Take 1 Tablet By Mouth Twice A Day 5)  Lovastatin 40 Mg Tabs (Lovastatin) .... Take 1 Tablet By Mouth Every Night 6)  Naproxen 500 Mg Tabs (Naproxen) .... Take 1 Tablet By Mouth Twice A Day 7)  Synthroid 88 Mcg Tabs (Levothyroxine Sodium) .... Take 1 Tablet By Mouth Once A Day 8)  Fluticasone  Propionate 50 Mcg/act Susp (Fluticasone Propionate) .Marland Kitchen.. 1 Spray / Nares Once A Day 9)  Adult Aspirin Low Strength 81 Mg  Tbdp (Aspirin) .... Take 1 Tablet By Mouth Once A Day 10)  Premarin 0.625 Mg/gm Crea (Estrogens, Conjugated) 11)  Azithromycin 250 Mg Tabs (Azithromycin) .... 2 Day #1, 1 Once Daily For 4 Days  Current Medications (verified): 1)  Alprazolam 0.5 Mg Tabs (Alprazolam) .... Take 1 Tablet By Mouth Three Times A Day 2)  Bupropion Hcl 75 Mg Tabs (Bupropion Hcl) .... Take 1 Tablet By Mouth Twice A Day 3)  Hydrochlorothiazide 25 Mg Tabs (Hydrochlorothiazide) .... Take 1 Tablet By Mouth Once A Day 4)  Hyoscyamine Sulfate 0.125 Mg Subl (Hyoscyamine Sulfate) .... Take 1 Tablet By Mouth Twice A Day 5)  Lovastatin 40 Mg Tabs (Lovastatin) .... Take 1 Tablet By Mouth Every Night 6)  Naproxen 500 Mg Tabs (Naproxen) .... Take 1 Tablet By Mouth Twice A Day 7)  Synthroid 88 Mcg Tabs (Levothyroxine Sodium) .... Take 1 Tablet By Mouth Once A Day 8)  Fluticasone Propionate 50 Mcg/act Susp (Fluticasone Propionate) .Marland Kitchen.. 1 Spray / Nares Once A Day 9)  Adult Aspirin Low Strength 81 Mg  Tbdp (Aspirin) .... Take 1 Tablet By Mouth Once A Day 10)  Premarin 0.625 Mg/gm Crea (  Estrogens, Conjugated) 11)  Augmentin 875-125 Mg Tabs (Amoxicillin-Pot Clavulanate) .... Generic - 1 By Mouth Two Times A Day  Allergies (verified): No Known Drug Allergies  Past History:  Past Medical History: Last updated: 06/27/2008 DIVERTICULOSIS OF COLON (ICD-562.10) LUMP OR MASS IN BREAST (ICD-611.72) HYPERTENSION (ICD-401.9) HYPOTHYROIDISM (ICD-244.9) ANXIETY DEPRESSION (ICD-300.4) IRRITABLE BOWEL SYNDROME (ICD-564.1) Congenital absence left kidney  Past Surgical History: Last updated: 06/27/2008 Lumpectomy '84 right - benign Tonsillectomy laparoscopy '72 - fertility work-up Conization '77 excision of skin lesiion left breast '06  Social History: Last updated: 01/05/2008 married '82 1 adopted daughter  '87 work: Tourist information centre manager marriage in [employer] health.  Risk Factors: Caffeine Use: 1 (06/27/2008) Diet: works on a helathy diet (06/29/2009) Exercise: no (06/29/2009)  Risk Factors: Smoking Status: never (06/29/2009)  Review of Systems       all otherwise negative per pt -    Physical Exam  General:  alert and overweight-appearing.  , mild ill  Head:  normocephalic and atraumatic.   Eyes:  vision grossly intact, pupils equal, and pupils round.   Ears:  bilat tm's red, sinus tender bilat  Nose:  nasal dischargemucosal pallor and mucosal edema.   Mouth:  pharyngeal erythema and fair dentition.   Neck:  supple and cervical lymphadenopathy.   Lungs:  normal respiratory effort and normal breath sounds.   Heart:  normal rate and regular rhythm.   Extremities:  no edema, no erythema    Impression & Recommendations:  Problem # 1:  SINUSITIS- ACUTE-NOS (ICD-461.9)  Her updated medication list for this problem includes:    Fluticasone Propionate 50 Mcg/act Susp (Fluticasone propionate) .Marland Kitchen... 1 spray / nares once a day    Augmentin 875-125 Mg Tabs (Amoxicillin-pot clavulanate) .Marland Kitchen... Generic - 1 by mouth two times a day treat as above, f/u any worsening signs or symptoms   Problem # 2:  HYPERTENSION (ICD-401.9)  Her updated medication list for this problem includes:    Hydrochlorothiazide 25 Mg Tabs (Hydrochlorothiazide) .Marland Kitchen... Take 1 tablet by mouth once a day  BP today: 120/72 Prior BP: 122/78 (09/04/2009)  Prior 10 Yr Risk Heart Disease: 6 % (05/31/2009)  Labs Reviewed: K+: 3.9 (06/21/2009) Creat: : 0.9 (06/21/2009)   Chol: 145 (06/21/2009)   HDL: 46.30 (06/21/2009)   LDL: 76 (06/21/2009)   TG: 113.0 (06/21/2009) stable overall by hx and exam, ok to continue meds/tx as is   Complete Medication List: 1)  Alprazolam 0.5 Mg Tabs (Alprazolam) .... Take 1 tablet by mouth three times a day 2)  Bupropion Hcl 75 Mg Tabs (Bupropion hcl) .... Take 1 tablet by mouth twice a  day 3)  Hydrochlorothiazide 25 Mg Tabs (Hydrochlorothiazide) .... Take 1 tablet by mouth once a day 4)  Hyoscyamine Sulfate 0.125 Mg Subl (Hyoscyamine sulfate) .... Take 1 tablet by mouth twice a day 5)  Lovastatin 40 Mg Tabs (Lovastatin) .... Take 1 tablet by mouth every night 6)  Naproxen 500 Mg Tabs (Naproxen) .... Take 1 tablet by mouth twice a day 7)  Synthroid 88 Mcg Tabs (Levothyroxine sodium) .... Take 1 tablet by mouth once a day 8)  Fluticasone Propionate 50 Mcg/act Susp (Fluticasone propionate) .Marland Kitchen.. 1 spray / nares once a day 9)  Adult Aspirin Low Strength 81 Mg Tbdp (Aspirin) .... Take 1 tablet by mouth once a day 10)  Premarin 0.625 Mg/gm Crea (Estrogens, conjugated) 11)  Augmentin 875-125 Mg Tabs (Amoxicillin-pot clavulanate) .... Generic - 1 by mouth two times a day  Patient Instructions: 1)  Please take all new medications as prescribed - the augmentin was sent to your pharmacy 2)  Continue all previous medications as before this visit  3)  You can also use Mucinex OTC or it's generic for congestion  4)  Please schedule an appointment with your primary doctor as needed Prescriptions: AUGMENTIN 875-125 MG TABS (AMOXICILLIN-POT CLAVULANATE) generic - 1 by mouth two times a day  #20 x 0   Entered and Authorized by:   Corwin Levins MD   Signed by:   Corwin Levins MD on 01/04/2010   Method used:   [facility]ronically to        [facility] [phone]* (retail)       [address]       [place], [place]  [address]       Ph: [phone]       Fax: [phone]   RxID:   [id number]

## 2010-11-15 NOTE — Progress Notes (Signed)
Summary: dr switch request   Phone Note Call from Patient Call back at Home Phone [phone]   Caller: Patient Call For: Dr. Marina Goodell Reason for Call: Talk to Nurse Summary of Call: pt would like to switch from Dr. Jarold Motto to Dr. Marina Goodell because her daughter has seen Dr. Marina Goodell in the hospital and she really liked him Initial call taken by: Vallarie Mare,  Mar 06, 2010 10:04 AM  Follow-up for Phone Call        this is fine---Dr. Marina Goodell is a fine Dr.Perry is an excellent Physician and will do a great job. Follow-up by: Mardella Layman MD Clementeen Graham,  Mar 06, 2010 11:50 AM    Additional Follow-up for Phone Call Additional follow up Details #2::    ok Follow-up by: Mardella Layman MD Clementeen Graham,  Mar 06, 2010 11:02 AM  Additional Follow-up for Phone Call Additional follow up Details #3:: Details for Additional Follow-up Action Taken: I appreciate the complement, but it is difficult for me to accommodate patients who have an established GI provider to my already full practice. Furthermore, Dr. Jarold Motto has performed multiple procedures on her and thus knows her best. Thanks anyway  Additional Follow-up by: Hilarie Fredrickson MD,  Mar 06, 2010 11:17 AM

## 2010-11-15 NOTE — Letter (Signed)
Summary: [facility]   Imported By: Sherian Rein 04/11/2010 11:51:06  _____________________________________________________________________  External Attachment:    Type:   Image     Comment:   External Document

## 2010-11-15 NOTE — Letter (Signed)
[facility] [address] [place], [place]  [address] Phone: [phone]      June 25, 2010   [place] [address] [place], [place] [address]  RE:  LAB RESULTS  Dear  Ms. SPRADLIN,  The following is an interpretation of your most recent lab tests.  Please take note of any instructions provided or changes to medications that have resulted from your lab work.  LIVER FUNCTION TESTS:  Good - no changes needed  LIPID PANEL:  Good - no changes needed Triglyceride: 121.0   Cholesterol: 147   LDL: 82   HDL: 40.60   Chol/HDL%:  4  THYROID STUDIES:  Thyroid studies normal TSH: 0.44      Labs look great. Hope your cold is better.   Sincerely Yours,    Jacques Navy MD  Patient: Brandi Ferguson Note: All result statuses are Final unless otherwise noted.  Tests: (1) Lipid Panel (LIPID)   Cholesterol               147 mg/dL                   2-956     ATP III Classification            Desirable:  < 200 mg/dL                    Borderline High:  200 - 239 mg/dL               High:  > = 240 mg/dL   Triglycerides             121.0 mg/dL                 2.1-308.6     Normal:  <150 mg/dL     Borderline High:  578 - 199 mg/dL   HDL                       46.96 mg/dL                 >29.52   VLDL Cholesterol          24.2 mg/dL                  8.4-13.2   LDL Cholesterol           82 mg/dL                    4-40  CHO/HDL Ratio:  CHD Risk                             4                    Men          Women     1/2 Average Risk     3.4          3.3     Average Risk          5.0          4.4     2X Average Risk          9.6          7.1     3X Average Risk          15.0  11.0                           Tests: (2) Hepatic/Liver Function Panel (HEPATIC)   Total Bilirubin           0.6 mg/dL                   1.6-1.0   Direct Bilirubin          0.1 mg/dL                   9.6-0.4   Alkaline Phosphatase      66 U/L                       39-117   AST                       27 U/L                      0-37   ALT                       24 U/L                      0-35   Total Protein             7.1 g/dL                    5.4-0.9   Albumin                   4.1 g/dL                    8.1-1.9  Tests: (3) TSH (TSH)   FastTSH                   0.44 uIU/mL                 0.35-5.50

## 2010-11-15 NOTE — Progress Notes (Signed)
Summary: increased bp  Phone Note Call from Patient Call back at Home Phone [phone]   Summary of Call: Patient left message on triage that last week she had a hysterectomy by Dr. Edward Jolly. Patient notes that her BP has been increased before, during, and after the surgery. Bystolic has been in 150's-170's and diastolic 80's-90's. Patient was unaware if her med (HCTZ) needs to be increased or changed, Please advise. Initial call taken by: Lucious Groves,  Feb 19, 2010 11:39 AM  Follow-up for Phone Call        called patient - no symptoms. Plan - have BP check at [facility] and get back to me if continues to be high.  Follow-up by: Jacques Navy MD,  Feb 19, 2010 6:16 PM

## 2010-11-15 NOTE — Progress Notes (Signed)
  Phone Note Refill Request Message from:  Fax from Pharmacy on November 09, 2010 10:23 AM  Refills Requested: Medication #1:  ALPRAZOLAM 0.5 MG TABS Take 1 tablet by mouth three times a day   Last Refilled: 10/13/2010 fax [facility] [address] Please Advise refills  Initial call taken by: Ami Bullins CMA,  November 09, 2010 10:24 AM    Prescriptions: ALPRAZOLAM 0.5 MG TABS (ALPRAZOLAM) Take 1 tablet by mouth three times a day  #90 x 5   Entered and Authorized by:   Jacques Navy MD   Signed by:   Jacques Navy MD on 11/09/2010   Method used:   Telephoned to ...       [facility] [phone][facility] (retail)       [address]       [place], [place]  [address]       Ph: [phone]       Fax: [phone]   RxID:   [id number]

## 2010-11-15 NOTE — Progress Notes (Signed)
Summary: REFERRAL  Phone Note Call from Patient Call back at Home Phone [phone]   Summary of Call: Patient is requesting to see Dr Marina Goodell but he is not accepting new already established patients. (See GI phone note) Patient is requesting referral to Dr Vincent Peyer.  Initial call taken by: Lamar Sprinkles, CMA,  March 15, 2010 8:44 AM  Follow-up for Phone Call        OK. Does she want to consider any of our other wonderful GI docs? or is her heart set on Dr. Matthias Hughs- who is also a really great guy.  Follow-up by: Jacques Navy MD,  March 15, 2010 1:00 PM  Additional Follow-up for Phone Call Additional follow up Details #1::        She was upset by the fact she was unable to change MD's here and is set on Dr Matthias Hughs or anyone in his practice please. Additional Follow-up by: Lamar Sprinkles, CMA,  March 15, 2010 3:50 PM    Additional Follow-up for Phone Call Additional follow up Details #2::    PCC's notified - referral to Dr. Matthias Hughs Follow-up by: Jacques Navy MD,  March 15, 2010 4:44 PM  Additional Follow-up for Phone Call Additional follow up Details #3:: Details for Additional Follow-up Action Taken: Pt informed  Additional Follow-up by: Lamar Sprinkles, CMA,  March 15, 2010 5:28 PM

## 2010-11-15 NOTE — Assessment & Plan Note (Signed)
Summary: ?UPPER RESP INF/CD   Vital Signs:  Patient profile:   65 year old female Height:      62 inches (157.48 cm) Weight:      179.75 pounds (81.70 kg) BMI:     33.00 O2 Sat:      97 % on Room air Temp:     98.5 degrees F (36.94 degrees C) oral Pulse rate:   88 / minute Pulse rhythm:   regular BP sitting:   128 / 78  (left arm) Cuff size:   regular  Vitals Entered By: Brenton Grills (February 06, 2010 4:18 PM)  O2 Flow:  Room air CC: pt c/o upper respiratory symptoms, pt states that her "sinuses are swollen, scratchy throat" due to drainage of mucus into throat and producing out ot nose (brownish yellow) x 4 days./pt states she is having surgery on Tuesday and was advised not to take any NSAID's or anti inflammatory drugs/aj   Primary Care Provider:  Glessie Eustice  CC:  pt c/o upper respiratory symptoms, pt states that her "sinuses are swollen, and scratchy throat" due to drainage of mucus into throat and producing out ot nose (brownish yellow) x 4 days./pt states she is having surgery on Tuesday and was advised not to take any NSAID's or anti inflammatory drugs/aj.  History of Present Illness: Patient is well known to me. she presents c/o sinus congestion, drainage, cough. She was treated for a sinus infection in the recent past. she is scheduled of hysterectomy next week and feels she should be clear of infection prior to surgery. She denies SOB, epistaxis, rigors, N/V  Current Medications (verified): 1)  Alprazolam 0.5 Mg Tabs (Alprazolam) .... Take 1 Tablet By Mouth Three Times A Day 2)  Bupropion Hcl 75 Mg Tabs (Bupropion Hcl) .... Take 1 Tablet By Mouth Twice A Day 3)  Hydrochlorothiazide 25 Mg Tabs (Hydrochlorothiazide) .... Take 1 Tablet By Mouth Once A Day 4)  Hyoscyamine Sulfate 0.125 Mg Subl (Hyoscyamine Sulfate) .... Take 1 Tablet By Mouth Twice A Day 5)  Lovastatin 40 Mg Tabs (Lovastatin) .... Take 1 Tablet By Mouth Every Night 6)  Naproxen 500 Mg Tabs (Naproxen) .... Take 1  Tablet By Mouth Twice A Day 7)  Synthroid 88 Mcg Tabs (Levothyroxine Sodium) .... Take 1 Tablet By Mouth Once A Day 8)  Fluticasone Propionate 50 Mcg/act Susp (Fluticasone Propionate) .Marland Kitchen.. 1 Spray / Nares Once A Day 9)  Adult Aspirin Low Strength 81 Mg  Tbdp (Aspirin) .... Take 1 Tablet By Mouth Once A Day 10)  Premarin 0.625 Mg/gm Crea (Estrogens, Conjugated) 11)  Augmentin 875-125 Mg Tabs (Amoxicillin-Pot Clavulanate) .... Generic - 1 By Mouth Two Times A Day  Allergies (verified): No Known Drug Allergies  Past History:  Past Medical History: Last updated: 06/27/2008 DIVERTICULOSIS OF COLON (ICD-562.10) LUMP OR MASS IN BREAST (ICD-611.72) HYPERTENSION (ICD-401.9) HYPOTHYROIDISM (ICD-244.9) ANXIETY DEPRESSION (ICD-300.4) IRRITABLE BOWEL SYNDROME (ICD-564.1) Congenital absence left kidney  Past Surgical History: Last updated: 06/27/2008 Lumpectomy '84 right - benign Tonsillectomy laparoscopy '72 - fertility work-up Conization '77 excision of skin lesiion left breast '06 PSH reviewed for relevance, FH reviewed for relevance  Review of Systems       The patient complains of fever, decreased hearing, hoarseness, and headaches.  The patient denies anorexia, weight loss, weight gain, chest pain, syncope, dyspnea on exertion, prolonged cough, abdominal pain, muscle weakness, depression, and enlarged lymph nodes.    Physical Exam  General:  Overweight white female in no acute distress Head:  tender to percussion over the frontal and maxillary sinus Eyes:  corneas and lenses clear and no injection.   Ears:  TMs are normal Nose:  no mucosal pallor, no mucosal edema, and no airflow obstruction.   Mouth:  throat clear Lungs:  normal respiratory effort, normal breath sounds, no crackles, and no wheezes.   Heart:  normal rate and regular rhythm.   Abdomen:  soft, non-tender, and normal bowel sounds.   Neurologic:  alert & oriented X3 and gait normal.   Skin:  turgor normal and color  normal.   Cervical Nodes:  no anterior cervical adenopathy and no posterior cervical adenopathy.   Psych:  Oriented X3, normally interactive, and good eye contact.     Impression & Recommendations:  Problem # 1:  SINUSITIS- ACUTE-NOS (ICD-461.9) Recurrent sinusitis. This is the second bout. Discussed the need for possible repeat CT sinus with the last being several years ago - reviewed: no chronic mucosal thickening, some minor anatomical changes.  Plan - Augmentin 875 two times a day x 7           continue fluticasone           sudafed 30mg  two times a day or three times a day.            if another sinus infection over the next 3 months will get CT sinus.   Her updated medication list for this problem includes:    Fluticasone Propionate 50 Mcg/act Susp (Fluticasone propionate) .Marland Kitchen... 1 spray / nares once a day    Augmentin 875-125 Mg Tabs (Amoxicillin-pot clavulanate) .Marland Kitchen... Generic - 1 by mouth two times a day  Complete Medication List: 1)  Alprazolam 0.5 Mg Tabs (Alprazolam) .... Take 1 tablet by mouth three times a day 2)  Bupropion Hcl 75 Mg Tabs (Bupropion hcl) .... Take 1 tablet by mouth twice a day 3)  Hydrochlorothiazide 25 Mg Tabs (Hydrochlorothiazide) .... Take 1 tablet by mouth once a day 4)  Hyoscyamine Sulfate 0.125 Mg Subl (Hyoscyamine sulfate) .... Take 1 tablet by mouth twice a day 5)  Lovastatin 40 Mg Tabs (Lovastatin) .... Take 1 tablet by mouth every night 6)  Naproxen 500 Mg Tabs (Naproxen) .... Take 1 tablet by mouth twice a day 7)  Synthroid 88 Mcg Tabs (Levothyroxine sodium) .... Take 1 tablet by mouth once a day 8)  Fluticasone Propionate 50 Mcg/act Susp (Fluticasone propionate) .Marland Kitchen.. 1 spray / nares once a day 9)  Adult Aspirin Low Strength 81 Mg Tbdp (Aspirin) .... Take 1 tablet by mouth once a day 10)  Premarin 0.625 Mg/gm Crea (Estrogens, conjugated) 11)  Augmentin 875-125 Mg Tabs (Amoxicillin-pot clavulanate) .... Generic - 1 by mouth two times a  day Prescriptions: AUGMENTIN 875-125 MG TABS (AMOXICILLIN-POT CLAVULANATE) generic - 1 by mouth two times a day  #14 x 0   Entered and Authorized by:   Jacques Navy MD   Signed by:   Jacques Navy MD on 02/06/2010   Method used:   Electronically to        [facility] [phone][facility] (retail)       [address]       [place], [place]  [address]       Ph: [phone]       Fax: [phone]   RxID:   [id number]

## 2010-11-15 NOTE — Assessment & Plan Note (Signed)
Summary: ?upper respiratory infection/norins/lb   Vital Signs:  Patient profile:   65 year old female Height:      62 inches Weight:      189.13 pounds BMI:     34.72 O2 Sat:      97 % on Room air Temp:     98.7 degrees F oral Pulse rate:   78 / minute BP sitting:   130 / 70  (left arm) Cuff size:   regular  Vitals Entered By: [name] CMA (AAMA) (August 21, 2010 4:00 PM)  O2 Flow:  Room air CC: Sore throat, chest congestion, sinus pressure/RE   Primary Care Provider:  Norins  CC:  Sore throat, chest congestion, and sinus pressure/RE.  History of Present Illness: here with acute onset mild to mod facial pain, pressure, fever, greenish d/c, with mild ST, no cough and Pt denies CP, worsening sob, doe, wheezing, orthopnea, pnd, worsening LE edema, palps, dizziness or syncope Pt denies new neuro symptoms such as headache, facial or extremity weakness  Overall good compliance with meds, good tolerability.  No recent wt loss, night sweats, loss of appetite or other constitutional symptoms   Problems Prior to Update: 1)  Sinusitis- Acute-nos  (ICD-461.9) 2)  Adenomatous Colonic Polyp  (ICD-211.3) 3)  Sinusitis- Acute-nos  (ICD-461.9) 4)  Uri  (ICD-465.9) 5)  Cough  (ICD-786.2) 6)  Routine General Medical Exam@health  Care Fac[facility]  (ICD-V70.0) 7)  Hyperlipidemia  (ICD-272.4) 8)  Overweight  (ICD-278.02) 9)  Diverticulosis of Colon  (ICD-562.10) 10)  Lump or Mass in Breast  (ICD-611.72) 11)  Hypertension  (ICD-401.9) 12)  Hypothyroidism  (ICD-244.9) 13)  Anxiety Depression  (ICD-300.4) 14)  Irritable Bowel Syndrome  (ICD-564.1)  Medications Prior to Update: 1)  Alprazolam 0.5 Mg Tabs (Alprazolam) .... Take 1 Tablet By Mouth Three Times A Day 2)  Bupropion Hcl 75 Mg Tabs (Bupropion Hcl) .... Take 1 Tablet By Mouth Twice A Day 3)  Hydrochlorothiazide 25 Mg Tabs (Hydrochlorothiazide) .... Take 1 Tablet By Mouth Once A Day 4)  Hyoscyamine Sulfate 0.125 Mg Subl (Hyoscyamine  Sulfate) .... Take 1 Tablet By Mouth Twice A Day 5)  Lovastatin 40 Mg Tabs (Lovastatin) .... Take 1 Tablet By Mouth Every Night 6)  Naproxen 500 Mg Tabs (Naproxen) .... Take 1 Tablet By Mouth Twice A Day 7)  Synthroid 88 Mcg Tabs (Levothyroxine Sodium) .... Take 1 Tablet By Mouth Once A Day 8)  Fluticasone Propionate 50 Mcg/act Susp (Fluticasone Propionate) .Marland Kitchen.. 1 Spray / Nares Once A Day 9)  Adult Aspirin Low Strength 81 Mg  Tbdp (Aspirin) .... Take 1 Tablet By Mouth Once A Day 10)  Premarin 0.625 Mg/gm Crea (Estrogens, Conjugated) 11)  Amoxicillin-Pot Clavulanate 875-125 Mg Tabs (Amoxicillin-Pot Clavulanate) .Marland Kitchen.. 1 By Mouth Two Times A Day X 7 Days For Sinusitis  Current Medications (verified): 1)  Alprazolam 0.5 Mg Tabs (Alprazolam) .... Take 1 Tablet By Mouth Three Times A Day 2)  Bupropion Hcl 75 Mg Tabs (Bupropion Hcl) .... Take 1 Tablet By Mouth Twice A Day 3)  Hydrochlorothiazide 25 Mg Tabs (Hydrochlorothiazide) .... Take 1 Tablet By Mouth Once A Day 4)  Hyoscyamine Sulfate 0.125 Mg Subl (Hyoscyamine Sulfate) .... Take 1 Tablet By Mouth Twice A Day 5)  Lovastatin 40 Mg Tabs (Lovastatin) .... Take 1 Tablet By Mouth Every Night 6)  Naproxen 500 Mg Tabs (Naproxen) .... Take 1 Tablet By Mouth Twice A Day 7)  Synthroid 88 Mcg Tabs (Levothyroxine Sodium) .... Take 1 Tablet  By Mouth Once A Day 8)  Fluticasone Propionate 50 Mcg/act Susp (Fluticasone Propionate) .Marland Kitchen.. 1 Spray / Nares Once A Day 9)  Adult Aspirin Low Strength 81 Mg  Tbdp (Aspirin) .... Take 1 Tablet By Mouth Once A Day 10)  Premarin 0.625 Mg/gm Crea (Estrogens, Conjugated) 11)  Levofloxacin 500 Mg Tabs (Levofloxacin) .Marland Kitchen.. 1po Once Daily  Allergies (verified): No Known Drug Allergies  Past History:  Past Medical History: Last updated: 06/27/2008 DIVERTICULOSIS OF COLON (ICD-562.10) LUMP OR MASS IN BREAST (ICD-611.72) HYPERTENSION (ICD-401.9) HYPOTHYROIDISM (ICD-244.9) ANXIETY DEPRESSION (ICD-300.4) IRRITABLE BOWEL  SYNDROME (ICD-564.1) Congenital absence left kidney  Past Surgical History: Last updated: 06/22/2010 Lumpectomy '84 right - benign Tonsillectomy laparoscopy '72 - fertility work-up Conization '77 excision of skin lesiion left breast '06 abdominal TAH/BSO May '11 (Brooks Silva)-fibroids, leiomyomata.  Social History: Last updated: 01/05/2008 married '82 1 adopted daughter '87 work: Tourist information centre manager marriage in [employer].  Risk Factors: Caffeine Use: 1 (06/27/2008) Diet: works on a helathy diet (06/29/2009) Exercise: no (06/29/2009)  Risk Factors: Smoking Status: never (06/29/2009)  Review of Systems       all otherwise negative per pt -    Physical Exam  General:  alert and overweight-appearing.  , mild ill  Head:  normocephalic and atraumatic.   Eyes:  vision grossly intact, pupils equal, and pupils round.   Ears:  bilat tm's red, sinus tender bilat Nose:  nasal dischargemucosal pallor and mucosal edema.   Mouth:  pharyngeal erythema and fair dentition.   Neck:  supple and cervical lymphadenopathy.   Lungs:  normal respiratory effort and normal breath sounds.   Heart:  normal rate and regular rhythm.   Extremities:  no edema, no erythema    Impression & Recommendations:  Problem # 1:  SINUSITIS- ACUTE-NOS (ICD-461.9)  Her updated medication list for this problem includes:    Fluticasone Propionate 50 Mcg/act Susp (Fluticasone propionate) .Marland Kitchen... 1 spray / nares once a day    Levofloxacin 500 Mg Tabs (Levofloxacin) .Marland Kitchen... 1po once daily treat as above, f/u any worsening signs or symptoms   Problem # 2:  HYPERTENSION (ICD-401.9)  Her updated medication list for this problem includes:    Hydrochlorothiazide 25 Mg Tabs (Hydrochlorothiazide) .Marland Kitchen... Take 1 tablet by mouth once a day  BP today: 130/70 Prior BP: 122/80 (06/22/2010)  Prior 10 Yr Risk Heart Disease: 6 % (05/31/2009)  Labs Reviewed: K+: 3.9 (06/21/2009) Creat: : 0.9 (06/21/2009)   Chol: 147  (06/22/2010)   HDL: 40.60 (06/22/2010)   LDL: 82 (06/22/2010)   TG: 121.0 (06/22/2010) stable overall by hx and exam, ok to continue meds/tx as is   Complete Medication List: 1)  Alprazolam 0.5 Mg Tabs (Alprazolam) .... Take 1 tablet by mouth three times a day 2)  Bupropion Hcl 75 Mg Tabs (Bupropion hcl) .... Take 1 tablet by mouth twice a day 3)  Hydrochlorothiazide 25 Mg Tabs (Hydrochlorothiazide) .... Take 1 tablet by mouth once a day 4)  Hyoscyamine Sulfate 0.125 Mg Subl (Hyoscyamine sulfate) .... Take 1 tablet by mouth twice a day 5)  Lovastatin 40 Mg Tabs (Lovastatin) .... Take 1 tablet by mouth every night 6)  Naproxen 500 Mg Tabs (Naproxen) .... Take 1 tablet by mouth twice a day 7)  Synthroid 88 Mcg Tabs (Levothyroxine sodium) .... Take 1 tablet by mouth once a day 8)  Fluticasone Propionate 50 Mcg/act Susp (Fluticasone propionate) .Marland Kitchen.. 1 spray / nares once a day 9)  Adult Aspirin Low Strength 81 Mg Tbdp (Aspirin) .Marland KitchenMarland KitchenMarland Kitchen  Take 1 tablet by mouth once a day 10)  Premarin 0.625 Mg/gm Crea (Estrogens, conjugated) 11)  Levofloxacin 500 Mg Tabs (Levofloxacin) .Marland Kitchen.. 1po once daily  Patient Instructions: 1)  Please take all new medications as prescribed 2)  Continue all previous medications as before this visit 3)  You can also use Mucinex OTC or it's generic for congestion  4)  Please schedule an appointment with your primary doctor as needed Prescriptions: LEVOFLOXACIN 500 MG TABS (LEVOFLOXACIN) 1po once daily  #10 x 0   Entered and Authorized by:   Corwin Levins MD   Signed by:   Corwin Levins MD on 08/21/2010   Method used:   Electronically to        [facility] [phone][facility] (retail)       [address]       [place], [place]  [address]       Ph: [phone]       Fax: [phone]   RxID:   [id number]    Orders Added: 1)  Est. Patient Level IV [24401]

## 2010-11-15 NOTE — Progress Notes (Signed)
  Phone Note Refill Request Message from:  Fax from Pharmacy on November 20, 2009 8:54 AM  Refills Requested: Medication #1:  ALPRAZOLAM 0.5 MG TABS Take 1 tablet by mouth three times a day   Last Refilled: 10/21/2009 recieved [facility] [address] [facility] [place], please Advise refill  Initial call taken by: Ami Bullins CMA,  November 20, 2009 8:55 AM  Follow-up for Phone Call        OK for refill x 5 Follow-up by: Jacques Navy MD,  November 20, 2009 9:45 AM    Prescriptions: ALPRAZOLAM 0.5 MG TABS (ALPRAZOLAM) Take 1 tablet by mouth three times a day  #90 x 5   Entered by:   Ami Bullins CMA   Authorized by:   Jacques Navy MD   Signed by:   Bill Salinas CMA on 11/20/2009   Method used:   Telephoned to ...       [facility] [phone]* (retail)       [address]       [place], [place]  [address]       Ph: [phone]       Fax: [phone]   RxID:   [id number]

## 2010-11-15 NOTE — Progress Notes (Signed)
Summary: GI REFERRAL  Phone Note Other Incoming   Summary of Call: Dr Clent Ridges is not accepting new patients. She is agreeable to see anyone else in the practice but dx is IBX. Pt says she is due for f/u colonoscopy and this is what the referral is for. Per the protocols pt is due in 2018 for f/u? Can you look further and help?  Initial call taken by: Lamar Sprinkles, CMA,  March 16, 2010 10:53 AM  Follow-up for Phone Call        record reviewed - she had an adenomatous polyp in 2008 and would be due for follow-up  @ 3-5 years, e.g. 2011-2013.  When she says any doctor in the practice - is that [facility] or our practice. Either way we are happy to refer. Follow-up by: Jacques Navy MD,  March 16, 2010 10:46 PM  Additional Follow-up for Phone Call Additional follow up Details #1::        New order in EMR, Pt informed  Additional Follow-up by: Lamar Sprinkles, CMA,  March 19, 2010 5:52 PM  New Problems: ADENOMATOUS COLONIC POLYP (ICD-211.3)   New Problems: ADENOMATOUS COLONIC POLYP (ICD-211.3)

## 2010-11-15 NOTE — Op Note (Signed)
Summary: [facility]  [place]   Imported By: Sherian Rein 03/27/2010 09:43:38  _____________________________________________________________________  External Attachment:    Type:   Image     Comment:   External Document

## 2011-01-01 LAB — COMPREHENSIVE METABOLIC PANEL
ALT: 18 U/L (ref 0–35)
AST: 21 U/L (ref 0–37)
Alkaline Phosphatase: 68 U/L (ref 39–117)
BUN: 10 mg/dL (ref 6–23)
Chloride: 105 mEq/L (ref 96–112)
Creatinine, Ser: 0.86 mg/dL (ref 0.4–1.2)
GFR calc Af Amer: >60 mL/min (ref >60)
GFR calc non Af Amer: >60 mL/min (ref >60)
Total Bilirubin: 0.5 mg/dL (ref 0.3–1.2)
Total Protein: 7.8 g/dL (ref 6.0–8.3)

## 2011-01-01 LAB — CBC
HCT: 49.4 % — ABNORMAL HIGH (ref 36.0–46.0)
Hemoglobin: 16.6 g/dL — ABNORMAL HIGH (ref 12.0–15.0)
MCHC: 34.4 g/dL (ref 30.0–36.0)
MCHC: 34.7 g/dL (ref 30.0–36.0)
MCV: 91.1 fL (ref 78.0–100.0)
MCV: 91.3 fL (ref 78.0–100.0)
MCV: 92.6 fL (ref 78.0–100.0)
WBC: 12.1 10*3/uL — ABNORMAL HIGH (ref 4.0–10.5)
WBC: 21.2 10*3/uL — ABNORMAL HIGH (ref 4.0–10.5)

## 2011-01-01 LAB — URINALYSIS, ROUTINE W REFLEX MICROSCOPIC
Hgb urine dipstick: NEGATIVE
Ketones, ur: 15 mg/dL — AB
Nitrite: NEGATIVE
Protein, ur: NEGATIVE mg/dL
Urobilinogen, UA: 0.2 mg/dL (ref 0.0–1.0)
pH: 6.5 (ref 5.0–8.0)

## 2011-01-01 LAB — BASIC METABOLIC PANEL
Chloride: 102 mEq/L (ref 96–112)
GFR calc non Af Amer: >60 mL/min (ref >60)
Potassium: 3.3 mEq/L — ABNORMAL LOW (ref 3.5–5.1)
Sodium: 138 mEq/L (ref 135–145)

## 2011-01-01 LAB — APTT: aPTT: 29 seconds (ref 24–37)

## 2011-01-01 LAB — TYPE AND SCREEN

## 2011-01-01 LAB — ABO/RH: ABO/RH(D): O POS

## 2011-01-01 LAB — PROTIME-INR: INR: 0.95 (ref 0.00–1.49)

## 2011-02-26 NOTE — Assessment & Plan Note (Signed)
[facility]                         GASTROENTEROLOGY OFFICE NOTE   Brandi Ferguson, Brandi Ferguson                    MRN:          [id number]  DATE:03/31/2007                            DOB:          07-04-1946    Brandi Ferguson is a 65 year old white female who has chronic IBS managed by  p.r.n. anticholinergic.  She is scheduled for follow-up colonoscopy,  which has not been done in the last five years.  She does have a long  current history of colon polyps.  She was followed closely by Dr. Debby Bud  and has not been seen in GI for a good period of time.  She does have a  lot of chronic anxiety and depression.  Is on antidepressants in the  form of Wellbutrin 750 mg twice daily.  She also has been followed by  Dr. Debby Bud and is being treated for hypothyroidism.  She has congenital  absence of her left kidney, mild essential hypertension.  She is status  post hysterectomy.   She is on multiple medications, including Synthroid 0.88 mg a day, Xanax  0.5 mg t.i.d., HCTZ 25 mg a day, p.r.n. hyoscyamine 1.125 mg, p.r.n.  Naprosyn 500 mg, Lovastatin 40 mg a day, Wellbutrin 75 mg twice daily,  and aspirin 81 mg a day.   FAMILY HISTORY:  Remarkable for a very strong family history of colon  carcinoma with a younger sister who died of colon carcinoma.  Several  colonoscopies in the past have been incomplete.   SOCIAL HISTORY:  The patient denies abuse of alcohol or cigarettes.   PHYSICAL EXAMINATION:  GENERAL:  She is a healthy-appearing white female  in no distress.  Appearing her stated age.  VITAL SIGNS:  She is 5 feet 2 inches tall and weighs 179 pounds.  Blood  pressure is 124/64, pulse 64 and regular.  SKIN:  I could not appreciate stigmata of chronic liver disease.  CHEST:  Clear.  CARDIAC:  She was in a regular rhythm without murmurs, rubs or gallops.  ABDOMEN:  She had no organomegaly, masses, or tenderness.  Bowel sounds  were normal.  EXTREMITIES:   Peripheral extremities were unremarkable.  MENTAL STATUS:  Clear.   ASSESSMENT:  1. Chronic irritable bowel syndrome which seems to be under good      control now with management of her depression.  2. History of recurrent colon polyps with a very strong family history      of colon carcinoma.  The patient has a very elongated tortuous      colon with difficult examinations.  3. Treated hypothyroidism.  4. Mild essential hypertension.  5. Congenital absence of her left kidney with normal renal function      and normal CBC a year ago.   RECOMMENDATIONS:  I have scheduled Ms. Allcock for a colonoscopy  tomorrow at her convenience.  Should this be unremarkable, continue  every 3-5 year followup.  She seems to be doing well with her other  medical problems and with her IBS.     Vania Rea. Jarold Motto, MD, Caleen Essex, FAGA  Electronically Signed    DRP/MedQ  DD: 03/31/2007  DT: 04/01/2007  Job #: [id number]   cc:   Rosalyn Gess. Norins, MD

## 2011-03-01 NOTE — Assessment & Plan Note (Signed)
[facility]                             PRIMARY CARE OFFICE NOTE   Brandi Ferguson, Brandi Ferguson                    MRN:          [id number]  DATE:05/27/2006                            DOB:          [birth year]    Brandi Ferguson is a pleasant 65 year old woman who is followed for multiple  medical problems including IBS, anxiety, depression, hypothyroid disease.  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  She is followed for  hypertension.   Surgical history including tonsillectomy, laparoscopy in 1972, lumpectomy in  1984.   The patient's past medical history, family history and social history is  well-documented in my notes of April 18, 2005.  In the interval the patient  has been seen for strep throat x1.  She has been seen for a sinus infection  x1.   CURRENT MEDICATIONS:  Synthroid 88 mcg daily, Xanax 0.5 mg t.i.d., Nasacort  nasal spray daily, HCTZ 25 mg daily, hyoscyamine 0.125 mg b.i.d. p.r.n.,  Naproxen 500 mg p.o. b.i.d. p.r.n., lovastatin 40 mg q. P.M., Wellbutrin 75  mg b.i.d., aspirin 81 mg daily.   HEALTH MAINTENANCE:  The patient is seen at Dr. Felipe Drone former office by  Shirlyn Goltz, nurse practitioner.  Last examination was January 2007.  The  patient's last mammogram was January 14, 2006, which was a digital unilateral  left diagnostic mammogram, demonstrating fibroglandular densities.  She is  to have a routine followup in October 2007.  The patient's last colonoscopy  was March 24, 2002 with diverticulosis, with no other abnormalities noted.  The patient's last tetanus booster was in 1999.   REVIEW OF SYSTEMS:  The patient has had no fevers, sweats, or chills.  She  has had an eye exam, but not in the last 12 months.  The patient has chronic  sinus problems but has had no infections.  She has had no cardiovascular,  respiratory complaints.  Her IBS has been stable.  No GU or musculoskeletal  complaints.   PHYSICAL EXAMINATION:   Temperature was 97.8, blood pressure 115/82, pulse  91, weight 184. Height 5' 6.  GENERAL APPEARANCE:  This is an overweight Caucasian female who looks her  stated age, in no acute distress.  HEENT:  Exam is normocephalic, atraumatic.  AC and TMs were unremarkable.  Oropharynx with native dentition in good repair.  No buccal or __________  were noted.  Posterior pharynx were clear.  Conjunctivae and sclerae were  clear.  Pupils equal, round, and reactive to light and accommodation.  Funduscopic exam with handheld instrument was unremarkable.  NECK:  Supple without thyromegaly . No adenopathy was noted in the cervical  and supraclavicular regions.  CHEST:  No CVA tenderness.  Lungs were clear to auscultation and percussion.  BREASTS:  Exam deferred to gynecology.  CARDIOVASCULAR:  2+ radial pulses.  No JVD or carotid bruits.  She had a  quiet precordium with regular rate and rhythm, without murmurs, rubs, or  gallops.  ABDOMEN:  Obese, soft, no guarding or rebound.  No organo or splenomegaly  was noted.  GENITALIA/RECTAL:  Deferred to gynecology.  EXTREMITIES:  Unremarkable.  NEUROLOGICAL:  Nonfocal.   DATABASE:  Hemoglobin was 15.2 g, white count 7,200 with normal  differential.  Cholesterol 154, triglycerides 94, HDL 47.1, LDL 88.  Chemistries were unremarkable with a serum glucose of 100, electrolytes were  normal.  Kidney function normal with a creatinine of 0.9.  Liver functions  were normal.  TSH was normal at 1.17.  Urinalysis was negative.   ASSESSMENT/PLAN:  1. Hyperlipidemia.  The patient has excellent control on lovastatin with      normal liver functions.  She will continue the same medications.  Her      prescription was refilled.  2. Hypothyroid disease.  The patient is well-controlled on her present      dose of Synthroid and will continue the same.  3. Hypertension.  The patient's blood pressure is very well-controlled at      this time.  She will continue on her  hydrochlorothiazide.  4. Psychosocial.  Patient reports that she is coping well, doing well with      Xanax and Wellbutrin despite some stresses with her family's illness.  5. Health maintenance.  The patient is currently up-to-date with her      gynecology, mammography, and colorectal cancer screening.   In summary, this is a very pleasant woman who seems to be medically stable  at this time.  She is asked to return to see me on a p.r.n. basis.                                   Rosalyn Gess Norins, MD   MEN/MedQ  DD:  05/27/2006  DT:  05/28/2006  Job #:  [id number]   cc:   Margarette Canada, N.P.

## 2011-03-01 NOTE — Assessment & Plan Note (Signed)
[facility]                                 ON-CALL NOTE   Brandi Ferguson, Brandi Ferguson                      MRN:          [id number]  DATE:12/12/2006                            DOB:          08-05-1946    Phone number is [phone].  She is having a problem with sore throat,  possibly sinus problem that going to her chest.  She tried to call the  office about quarter of five, but the phones are down at [facility], and she  wants to know how she can be evaluated.  We will have her come in to the  office Saturday morning at 9 o'clock and evaluate her then.     [name]. Fabian Sharp, MD  Electronically Signed    WKP/MedQ  DD: 12/12/2006  DT: 12/12/2006  Job #: [id number]

## 2011-03-25 ENCOUNTER — Other Ambulatory Visit: Payer: Self-pay | Admitting: Internal Medicine

## 2011-03-25 ENCOUNTER — Other Ambulatory Visit: Payer: Self-pay

## 2011-03-25 DIAGNOSIS — Z0000 Encounter for general adult medical examination without abnormal findings: Secondary | ICD-10-CM

## 2011-03-28 ENCOUNTER — Other Ambulatory Visit (INDEPENDENT_AMBULATORY_CARE_PROVIDER_SITE_OTHER): Payer: Self-pay

## 2011-03-28 DIAGNOSIS — Z0000 Encounter for general adult medical examination without abnormal findings: Secondary | ICD-10-CM

## 2011-03-28 LAB — BASIC METABOLIC PANEL
CO2: 26 mEq/L (ref 19–32)
Calcium: 9.9 mg/dL (ref 8.4–10.5)
Creatinine, Ser: 0.8 mg/dL (ref 0.4–1.2)
GFR: 72.87 mL/min (ref >60.00)
Sodium: 142 mEq/L (ref 135–145)

## 2011-03-28 LAB — LIPID PANEL
Cholesterol: 156 mg/dL (ref 0–200)
HDL: 47.1 mg/dL (ref >39.00)
Total CHOL/HDL Ratio: 3
Triglycerides: 104 mg/dL (ref 0.0–149.0)

## 2011-03-28 LAB — CBC WITH DIFFERENTIAL/PLATELET
Basophils Relative: 0.7 % (ref 0.0–3.0)
Eosinophils Absolute: 0.1 10*3/uL (ref 0.0–0.7)
Hemoglobin: 15.5 g/dL — ABNORMAL HIGH (ref 12.0–15.0)
MCHC: 34.6 g/dL (ref 30.0–36.0)
MCV: 90.9 fl (ref 78.0–100.0)
Monocytes Absolute: 0.5 10*3/uL (ref 0.1–1.0)
Neutro Abs: 4.6 10*3/uL (ref 1.4–7.7)
RBC: 4.94 Mil/uL (ref 3.87–5.11)
RDW: 13.9 % (ref 11.5–14.6)

## 2011-03-28 LAB — URINALYSIS
Ketones, ur: NEGATIVE
Leukocytes, UA: NEGATIVE
Nitrite: NEGATIVE
Specific Gravity, Urine: 1.025 (ref 1.000–1.030)
Total Protein, Urine: NEGATIVE
pH: 6 (ref 5.0–8.0)

## 2011-03-28 LAB — HEPATIC FUNCTION PANEL
AST: 28 U/L (ref 0–37)
Alkaline Phosphatase: 64 U/L (ref 39–117)
Bilirubin, Direct: 0.1 mg/dL (ref 0.0–0.3)
Total Protein: 7.3 g/dL (ref 6.0–8.3)

## 2011-03-30 ENCOUNTER — Encounter: Payer: Self-pay | Admitting: Internal Medicine

## 2011-04-01 ENCOUNTER — Encounter: Payer: Self-pay | Admitting: Internal Medicine

## 2011-04-02 ENCOUNTER — Ambulatory Visit (INDEPENDENT_AMBULATORY_CARE_PROVIDER_SITE_OTHER): Payer: BC Managed Care – PPO | Admitting: Internal Medicine

## 2011-04-02 VITALS — BP 124/88 | HR 91 | Temp 97.5°F | Ht 63.0 in | Wt 185.0 lb

## 2011-04-02 DIAGNOSIS — I10 Essential (primary) hypertension: Secondary | ICD-10-CM

## 2011-04-02 DIAGNOSIS — E039 Hypothyroidism, unspecified: Secondary | ICD-10-CM

## 2011-04-02 DIAGNOSIS — Z136 Encounter for screening for cardiovascular disorders: Secondary | ICD-10-CM

## 2011-04-02 DIAGNOSIS — E663 Overweight: Secondary | ICD-10-CM

## 2011-04-02 DIAGNOSIS — Z0000 Encounter for general adult medical examination without abnormal findings: Secondary | ICD-10-CM

## 2011-04-02 DIAGNOSIS — E785 Hyperlipidemia, unspecified: Secondary | ICD-10-CM

## 2011-04-02 NOTE — Progress Notes (Signed)
Subjective:    Patient ID: Brandi Ferguson, female    DOB: 10-29-1945, 65 y.o.   MRN: [id number]  HPI Mrs. Giebler presents for a routine medical exam. She is doing well, and has no complaints. She has seen Dr. Edward Jolly last month for routine gyn and breast exam. She has had mammogram in October.   Past Medical History  Diagnosis Date  . 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 ANXIETY DEPRESSION 02/06/2008  . Cough 09/27/2008  . DIVERTICULOSIS OF COLON 02/06/2008  . HYPERLIPIDEMIA 06/27/2008  . HYPERTENSION 02/06/2008  . HYPOTHYROIDISM 02/06/2008  . Irritable bowel syndrome 02/06/2008  . Lump or mass in breast 02/06/2008  . Overweight 06/27/2008  . SINUSITIS- ACUTE-NOS 01/04/2010  . URI 09/04/2009   Past Surgical History  Procedure Date  . Breast surgery 1984    Right lumpectomy- Benign  . Tonsillectomy   . Laparoscopy 1972    Fertility work-up  . Cervical cone biopsy 1977  . Skin lesion excision 2006    Left breast   Family History  Problem Relation Age of Onset  . Alzheimer's disease Mother   . Alcohol abuse Father   . Diabetes Father   . Hyperlipidemia Father   . Hypertension Father   . Coronary artery disease Father   . Cancer Sister    History   Social History  . Marital Status: Married    Spouse Name: Ree Kida    Number of Children: 1  . Years of Education: N/A   Occupational History  . elementary school teacher    Social History Main Topics  . Smoking status: Never Smoker   . Smokeless tobacco: Not on file  . Alcohol Use:   . Drug Use:   . Sexually Active:    Other Topics Concern  . Not on file   Social History Narrative   Married "26", 1 adopted daughter "75". Work Tourist information centre manager. Marriage in [place]       Review of Systems     Objective:   Physical Exam Vitals reviewed. Gen'l: well nourished, well developed, overweight white woman in no distress HEENT - [place]/AT, EACs/TMs normal, oropharynx with native dentition in good condition, no  buccal or palatal lesions, posterior pharynx clear, mucous membranes moist. C&S clear, PERRLA, fundi - normal Neck - supple, no thyromegaly Nodes- negative submental, cervical, supraclavicular regions Chest - no deformity, no CVAT Lungs - cleat without rales, wheezes. No increased work of breathing Breast - deferred to gyn Cardiovascular - regular rate and rhythm, quiet precordium, no murmurs, rubs or gallops, 2+ radial, DP and PT pulses Abdomen - BS+ x 4, no HSM, no guarding or rebound or tenderness Pelvic - deferred to gyn Rectal - deferred to gyn Extremities - no clubbing, cyanosis, edema or deformity.  Neuro - A&O x 3, CN II-XII normal, motor strength normal and equal, DTRs 2+ and symmetrical biceps, radial, and patellar tendons. Cerebellar - no tremor, no rigidity, fluid movement and normal gait. Derm - Head, neck, back, abdomen and extremities without suspicious lesions  Lab Results  Component Value Date   WBC 8.5 03/28/2011   HGB 15.5* 03/28/2011   HCT 44.9 03/28/2011   PLT 318.0 03/28/2011   CHOL 156 03/28/2011   TRIG 104.0 03/28/2011   HDL 47.10 03/28/2011   ALT 26 03/28/2011   AST 28 03/28/2011   NA 142 03/28/2011   K 4.3 03/28/2011   CL 109 03/28/2011   CREATININE 0.8 03/28/2011   BUN 20 03/28/2011   CO2  26 03/28/2011   TSH 0.71 03/28/2011   INR 0.95 02/12/2010           Assessment & Plan:

## 2011-04-03 ENCOUNTER — Other Ambulatory Visit: Payer: Self-pay | Admitting: *Deleted

## 2011-04-03 DIAGNOSIS — Z0000 Encounter for general adult medical examination without abnormal findings: Secondary | ICD-10-CM | POA: Insufficient documentation

## 2011-04-03 MED ORDER — ESTROGENS, CONJUGATED 0.625 MG/GM VA CREA: TOPICAL_CREAM | Freq: Every day | VAGINAL | Status: DC

## 2011-04-03 MED ORDER — LEVOTHYROXINE SODIUM 88 MCG PO TABS: 88.0000 ug | ORAL_TABLET | Freq: Every day | ORAL | Status: DC

## 2011-04-03 MED ORDER — NAPROXEN 500 MG PO TABS: 500.0000 mg | ORAL_TABLET | Freq: Two times a day (BID) | ORAL | Status: DC

## 2011-04-03 MED ORDER — FLUTICASONE PROPIONATE 50 MCG/ACT NA SUSP: 2.0000 | Freq: Every day | NASAL | Status: DC

## 2011-04-03 MED ORDER — BUPROPION HCL 75 MG PO TABS: 75.0000 mg | ORAL_TABLET | Freq: Two times a day (BID) | ORAL | Status: DC

## 2011-04-03 MED ORDER — HYDROCHLOROTHIAZIDE 25 MG PO TABS: 25.0000 mg | ORAL_TABLET | Freq: Every day | ORAL | Status: DC

## 2011-04-03 MED ORDER — LOVASTATIN 40 MG PO TABS: 40.0000 mg | ORAL_TABLET | Freq: Every day | ORAL | Status: DC

## 2011-04-03 NOTE — Assessment & Plan Note (Signed)
BP Readings from Last 3 Encounters:  04/02/11 124/88  08/21/10 130/70  06/22/10 122/80   Very good control on low dose diuretic alone. No change in regimen.

## 2011-04-03 NOTE — Assessment & Plan Note (Signed)
Interval history is unremarkable. Physical exam, sans pelvic and breast, is normal. Lab results are in normal range. She is current with Dr. Edward Jolly for gyn and breast exam. She is current with mammography. Current with colorectal cancer screening with last colonoscopy in '11. Immunizations - current with tetanus; declines shingles vaccine. 12 lead EKG without any signs of ischemia.  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  Her profession continues to be under fire and stress.  In summary - a very nice woman who appears to be medically stable. She will return as needed or in 1 year.

## 2011-04-03 NOTE — Assessment & Plan Note (Signed)
Discussed the absolute importance of weight management and reduction in order to reduce morbidity.  Plan - continued eight managmeent with smart food choices, PORTION SIZE control and regular exercise            Goal - to loose 1-2 lbs per month.

## 2011-04-03 NOTE — Assessment & Plan Note (Signed)
Excellent control of cholesterol on present medications.  Plan - continue present regimen           Continue life-style management: low fat diet and exercise

## 2011-04-03 NOTE — Assessment & Plan Note (Signed)
TSH 0.71 consistent with adequate control of hypothyroidism on present medication dose.  Plan - continue present regimen

## 2011-05-03 ENCOUNTER — Telehealth: Payer: Self-pay | Admitting: *Deleted

## 2011-05-03 ENCOUNTER — Other Ambulatory Visit: Payer: Self-pay

## 2011-05-03 MED ORDER — ALPRAZOLAM 0.5 MG PO TABS: 0.5000 mg | ORAL_TABLET | Freq: Three times a day (TID) | ORAL | Status: DC | PRN

## 2011-05-03 NOTE — Telephone Encounter (Signed)
Request Alprazolam to [facility]. Rx done/LMOM to inform patient.

## 2011-05-03 NOTE — Telephone Encounter (Signed)
rx sent to pharmacy

## 2011-05-03 NOTE — Telephone Encounter (Signed)
rx request for alprazolam 0.5 mg; please advise

## 2011-05-03 NOTE — Telephone Encounter (Signed)
Ok for refill x 5 

## 2011-05-22 ENCOUNTER — Ambulatory Visit (INDEPENDENT_AMBULATORY_CARE_PROVIDER_SITE_OTHER): Payer: BC Managed Care – PPO | Admitting: Internal Medicine

## 2011-05-22 ENCOUNTER — Encounter: Payer: Self-pay | Admitting: Internal Medicine

## 2011-05-22 VITALS — BP 110/80 | HR 75 | Temp 97.5°F | Ht 62.0 in | Wt 188.4 lb

## 2011-05-22 DIAGNOSIS — J019 Acute sinusitis, unspecified: Secondary | ICD-10-CM

## 2011-05-22 DIAGNOSIS — I10 Essential (primary) hypertension: Secondary | ICD-10-CM

## 2011-05-22 DIAGNOSIS — F341 Dysthymic disorder: Secondary | ICD-10-CM

## 2011-05-22 MED ORDER — HYDROCODONE-HOMATROPINE 5-1.5 MG/5ML PO SYRP: 5.0000 mL | ORAL_SOLUTION | Freq: Four times a day (QID) | ORAL | Status: AC | PRN

## 2011-05-22 MED ORDER — LEVOFLOXACIN 500 MG PO TABS: 500.0000 mg | ORAL_TABLET | Freq: Every day | ORAL | Status: AC

## 2011-05-22 NOTE — Progress Notes (Signed)
Subjective:    Patient ID: Brandi Ferguson, female    DOB: 1946/02/16, 65 y.o.   MRN: [id number]  HPI  Here with 3 days acute onset fever, facial pain, pressure, general weakness and malaise, and greenish d/c, with slight ST, but little to no cough and Pt denies chest pain, increased sob or doe, wheezing, orthopnea, PND, increased LE swelling, palpitations, dizziness or syncope.  Pt denies new neurological symptoms such as new headache, or facial or extremity weakness or numbness   Pt denies polydipsia, polyuria.  Denies worsening depressive symptoms, suicidal ideation, or panic, though has ongoing anxiety, not increased recently.    Past Medical History  Diagnosis Date  . 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 ANXIETY DEPRESSION 02/06/2008  . Cough 09/27/2008  . DIVERTICULOSIS OF COLON 02/06/2008  . HYPERLIPIDEMIA 06/27/2008  . HYPERTENSION 02/06/2008  . HYPOTHYROIDISM 02/06/2008  . Irritable bowel syndrome 02/06/2008  . Lump or mass in breast 02/06/2008  . Overweight 06/27/2008  . SINUSITIS- ACUTE-NOS 01/04/2010  . URI 09/04/2009   Past Surgical History  Procedure Date  . Breast surgery 1984    Right lumpectomy- Benign  . Tonsillectomy   . Laparoscopy 1972    Fertility work-up  . Cervical cone biopsy 1977  . Skin lesion excision 2006    Left breast    reports that she has never smoked. She does not have any smokeless tobacco history on file. Her alcohol and drug histories not on file. family history includes Alcohol abuse in her father; Alzheimer's disease in her mother; Cancer in her sister; Coronary artery disease in her father; Diabetes in her father; Hyperlipidemia in her father; and Hypertension in her father. No Known Allergies Current Outpatient Prescriptions on File Prior to Visit  Medication Sig Dispense Refill  . ALPRAZolam (XANAX) 0.5 MG tablet Take 1 tablet (0.5 mg total) by mouth 3 (three) times daily as needed.  90 tablet  5  . aspirin 81 MG tablet Take 81 mg by mouth  daily.        Marland Kitchen buPROPion (WELLBUTRIN) 75 MG tablet Take 1 tablet (75 mg total) by mouth 2 (two) times daily.  60 tablet  11  . fluticasone (FLONASE) 50 MCG/ACT nasal spray Place 2 sprays into the nose daily.  16 g  3  . hydrochlorothiazide 25 MG tablet Take 1 tablet (25 mg total) by mouth at bedtime.  30 tablet  11  . hyoscyamine (ANASPAZ) 0.125 MG TBDP Place 0.125 mg under the tongue 2 (two) times daily.        Marland Kitchen levothyroxine (SYNTHROID, LEVOTHROID) 88 MCG tablet Take 1 tablet (88 mcg total) by mouth daily.  30 tablet  11  . lovastatin (MEVACOR) 40 MG tablet Take 1 tablet (40 mg total) by mouth at bedtime.  30 tablet  11  . naproxen (NAPROSYN) 500 MG tablet Take 1 tablet (500 mg total) by mouth 2 (two) times daily with a meal.  60 tablet  3  . conjugated estrogens (PREMARIN) vaginal cream Place vaginally daily. Use As Directed.  42.5 g  3   Review of Systems Review of Systems  Constitutional: Negative for diaphoresis and unexpected weight change.  HENT: Negative for drooling and tinnitus.   Eyes: Negative for photophobia and visual disturbance.  Respiratory: Negative for choking and stridor.   Gastrointestinal: Negative for vomiting and blood in stool.    Objective:   Physical Exam BP 110/80  Pulse 75  Temp(Src) 97.5 F (36.4 C) (Oral)  Ht 5\' 2"  (  1.575 m)  Wt 188 lb 6 oz (85.446 kg)  BMI 34.45 kg/m2  SpO2 97% Physical Exam  VS noted, mild ill Constitutional: Pt appears well-developed and well-nourished.  HENT: Head: Normocephalic.  Right Ear: External ear normal.  Left Ear: External ear normal.  Bilat tm's mild erythema.  Sinus tender bilat.  Pharynx mild erythema Eyes: Conjunctivae and EOM are normal. Pupils are equal, round, and reactive to light.  Neck: Normal range of motion. Neck supple.  Cardiovascular: Normal rate and regular rhythm.   Pulmonary/Chest: Effort normal and breath sounds normal.  Neurological: Pt is alert. No cranial nerve deficit.  Skin: Skin is warm. No  erythema.  Psychiatric: Pt behavior is normal. Thought content normal. 1+ nervous not depressed affect        Assessment & Plan:

## 2011-05-22 NOTE — Assessment & Plan Note (Signed)
Mild to mod, for antibx course,  to f/u any worsening symptoms or concerns 

## 2011-05-22 NOTE — Assessment & Plan Note (Signed)
stable overall by hx and exam, most recent data reviewed with pt, and pt to continue medical treatment as before  BP Readings from Last 3 Encounters:  05/22/11 110/80  04/02/11 124/88  08/21/10 130/70

## 2011-05-22 NOTE — Patient Instructions (Signed)
Take all new medications as prescribed Continue all other medications as before  

## 2011-05-22 NOTE — Assessment & Plan Note (Signed)
stable overall by hx and exam, , and pt to continue medical treatment as before   

## 2011-05-24 ENCOUNTER — Ambulatory Visit: Payer: BC Managed Care – PPO | Admitting: Internal Medicine

## 2011-07-02 ENCOUNTER — Telehealth: Payer: Self-pay | Admitting: *Deleted

## 2011-07-02 MED ORDER — HYOSCYAMINE SULFATE 0.125 MG PO TBDP: 0.1250 mg | ORAL_TABLET | Freq: Two times a day (BID) | ORAL | Status: DC

## 2011-07-02 NOTE — Telephone Encounter (Signed)
REfill

## 2011-07-09 ENCOUNTER — Other Ambulatory Visit: Payer: Self-pay | Admitting: Internal Medicine

## 2011-07-09 DIAGNOSIS — Z1231 Encounter for screening mammogram for malignant neoplasm of breast: Secondary | ICD-10-CM

## 2011-08-05 ENCOUNTER — Ambulatory Visit
Admission: RE | Admit: 2011-08-05 | Discharge: 2011-08-05 | Disposition: A | Discharge status: Home / Self Care | Payer: BC Managed Care – PPO | Source: Ambulatory Visit | Attending: Internal Medicine | Admitting: Internal Medicine

## 2011-08-05 DIAGNOSIS — Z1231 Encounter for screening mammogram for malignant neoplasm of breast: Secondary | ICD-10-CM

## 2011-10-24 ENCOUNTER — Telehealth: Payer: Self-pay | Admitting: *Deleted

## 2011-10-24 NOTE — Telephone Encounter (Signed)
Ok x 5 

## 2011-10-24 NOTE — Telephone Encounter (Signed)
Refill request for Alprazolam (Xanax) 0.5mg  with 5 refills. Last refilled on 7.20.12. Ok to refill?

## 2011-10-25 ENCOUNTER — Other Ambulatory Visit: Payer: Self-pay | Admitting: *Deleted

## 2011-10-25 NOTE — Telephone Encounter (Signed)
Phoned in refill request.

## 2012-01-05 ENCOUNTER — Other Ambulatory Visit: Payer: Self-pay | Admitting: Internal Medicine

## 2012-03-12 ENCOUNTER — Encounter: Payer: Self-pay | Admitting: Endocrinology

## 2012-03-12 ENCOUNTER — Ambulatory Visit (INDEPENDENT_AMBULATORY_CARE_PROVIDER_SITE_OTHER): Payer: BC Managed Care – PPO | Admitting: Endocrinology

## 2012-03-12 VITALS — BP 122/78 | HR 84 | Temp 97.7°F | Ht 62.0 in | Wt 194.0 lb

## 2012-03-12 DIAGNOSIS — L738 Other specified follicular disorders: Secondary | ICD-10-CM

## 2012-03-12 DIAGNOSIS — L739 Follicular disorder, unspecified: Secondary | ICD-10-CM

## 2012-03-12 MED ORDER — DOXYCYCLINE HYCLATE 100 MG PO TABS: 100.0000 mg | ORAL_TABLET | Freq: Two times a day (BID) | ORAL | Status: AC

## 2012-03-12 NOTE — Patient Instructions (Addendum)
i have sent a prescription to your pharmacy, for an antibiotic I hope you feel better soon.  If you don't feel better by next week, please call back.  Please call sooner if you get worse. To prevent recurrence, clip rather than shave your underarms.

## 2012-03-12 NOTE — Progress Notes (Signed)
Subjective:    Patient ID: Brandi Ferguson, female    DOB: [birth year], 66 y.o.   MRN: [id number]  HPI Pt states 2 weeks of slight rash at the right axilla, and assoc pain.  She has had this before at both axillae.   Past Medical History  Diagnosis Date  . 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 ANXIETY DEPRESSION 02/06/2008  . Cough 09/27/2008  . DIVERTICULOSIS OF COLON 02/06/2008  . HYPERLIPIDEMIA 06/27/2008  . HYPERTENSION 02/06/2008  . HYPOTHYROIDISM 02/06/2008  . Irritable bowel syndrome 02/06/2008  . Lump or mass in breast 02/06/2008  . Overweight 06/27/2008  . SINUSITIS- ACUTE-NOS 01/04/2010  . URI 09/04/2009    Past Surgical History  Procedure Date  . Breast surgery 1984    Right lumpectomy- Benign  . Tonsillectomy   . Laparoscopy 1972    Fertility work-up  . Cervical cone biopsy 1977  . Skin lesion excision 2006    Left breast    History   Social History  . Marital Status: Married    Spouse Name: Ree Kida    Number of Children: 1  . Years of Education: N/A   Occupational History  . elementary school teacher    Social History Main Topics  . Smoking status: Never Smoker   . Smokeless tobacco: Not on file  . Alcohol Use:   . Drug Use:   . Sexually Active:    Other Topics Concern  . Not on file   Social History Narrative   Married "57", 1 adopted daughter "16". Work Tourist information centre manager. Marriage in [place]    Current Outpatient Prescriptions on File Prior to Visit  Medication Sig Dispense Refill  . ALPRAZolam (XANAX) 0.5 MG tablet Take 1 tablet (0.5 mg total) by mouth 3 (three) times daily as needed.  90 tablet  5  . aspirin 81 MG tablet Take 81 mg by mouth daily.        Marland Kitchen buPROPion (WELLBUTRIN) 75 MG tablet Take 1 tablet (75 mg total) by mouth 2 (two) times daily.  60 tablet  11  . fluticasone (FLONASE) 50 MCG/ACT nasal spray USE TWO SPRAYS IN EACH NOSTRIL DAILY  16 g  2  . hydrochlorothiazide 25 MG tablet Take 1 tablet (25 mg total) by mouth at  bedtime.  30 tablet  11  . hyoscyamine (ANASPAZ) 0.125 MG TBDP Place 0.125 mg under the tongue 2 (two) times daily as needed.      Marland Kitchen levothyroxine (SYNTHROID, LEVOTHROID) 88 MCG tablet Take 1 tablet (88 mcg total) by mouth daily.  30 tablet  11  . lovastatin (MEVACOR) 40 MG tablet Take 1 tablet (40 mg total) by mouth at bedtime.  30 tablet  11  . naproxen (NAPROSYN) 500 MG tablet Take 500 mg by mouth 2 (two) times daily as needed.        No Known Allergies  Family History  Problem Relation Age of Onset  . Alzheimer's disease Mother   . Alcohol abuse Father   . Diabetes Father   . Hyperlipidemia Father   . Hypertension Father   . Coronary artery disease Father   . Cancer Sister    BP 122/78  Pulse 84  Temp(Src) 97.7 F (36.5 C) (Oral)  Ht 5\' 2"  (1.575 m)  Wt 194 lb (87.998 kg)  BMI 35.48 kg/m2  SpO2 97%  Review of Systems Denies fever    Objective:   Physical Exam VITAL SIGNS:  See vs page GENERAL: no distress Right axilla:  A total of approx 8 tiny pustules.      Assessment & Plan:  Folliculitis, new

## 2012-03-26 ENCOUNTER — Other Ambulatory Visit: Payer: Self-pay | Admitting: Obstetrics and Gynecology

## 2012-04-03 ENCOUNTER — Ambulatory Visit (INDEPENDENT_AMBULATORY_CARE_PROVIDER_SITE_OTHER): Payer: BC Managed Care – PPO | Admitting: Internal Medicine

## 2012-04-03 ENCOUNTER — Telehealth: Payer: Self-pay | Admitting: *Deleted

## 2012-04-03 ENCOUNTER — Encounter: Payer: Self-pay | Admitting: Internal Medicine

## 2012-04-03 ENCOUNTER — Other Ambulatory Visit (INDEPENDENT_AMBULATORY_CARE_PROVIDER_SITE_OTHER): Payer: BC Managed Care – PPO

## 2012-04-03 ENCOUNTER — Other Ambulatory Visit: Payer: Self-pay | Admitting: *Deleted

## 2012-04-03 VITALS — BP 140/86 | HR 98 | Temp 97.7°F | Resp 16 | Wt 192.5 lb

## 2012-04-03 DIAGNOSIS — E663 Overweight: Secondary | ICD-10-CM

## 2012-04-03 DIAGNOSIS — I10 Essential (primary) hypertension: Secondary | ICD-10-CM

## 2012-04-03 DIAGNOSIS — E785 Hyperlipidemia, unspecified: Secondary | ICD-10-CM

## 2012-04-03 DIAGNOSIS — E039 Hypothyroidism, unspecified: Secondary | ICD-10-CM

## 2012-04-03 DIAGNOSIS — Z0000 Encounter for general adult medical examination without abnormal findings: Secondary | ICD-10-CM

## 2012-04-03 LAB — COMPREHENSIVE METABOLIC PANEL
ALT: 23 U/L (ref 0–35)
AST: 27 U/L (ref 0–37)
Albumin: 4 g/dL (ref 3.5–5.2)
BUN: 15 mg/dL (ref 6–23)
CO2: 30 mEq/L (ref 19–32)
Calcium: 9.2 mg/dL (ref 8.4–10.5)
Chloride: 103 mEq/L (ref 96–112)
Creatinine, Ser: 1 mg/dL (ref 0.4–1.2)
GFR: 63.02 mL/min (ref >60.00)
Potassium: 3.3 mEq/L — ABNORMAL LOW (ref 3.5–5.1)

## 2012-04-03 LAB — LIPID PANEL
Cholesterol: 124 mg/dL (ref 0–200)
LDL Cholesterol: 41 mg/dL (ref 0–99)
Total CHOL/HDL Ratio: 3
Triglycerides: 177 mg/dL — ABNORMAL HIGH (ref 0.0–149.0)

## 2012-04-03 LAB — HEPATIC FUNCTION PANEL
ALT: 23 U/L (ref 0–35)
Total Protein: 6.9 g/dL (ref 6.0–8.3)

## 2012-04-03 MED ORDER — NAPROXEN 500 MG PO TABS: 500.0000 mg | ORAL_TABLET | Freq: Two times a day (BID) | ORAL | Status: DC | PRN

## 2012-04-03 MED ORDER — BUPROPION HCL 75 MG PO TABS: 75.0000 mg | ORAL_TABLET | Freq: Two times a day (BID) | ORAL | Status: DC

## 2012-04-03 MED ORDER — HYDROCHLOROTHIAZIDE 25 MG PO TABS: 25.0000 mg | ORAL_TABLET | Freq: Every day | ORAL | Status: DC

## 2012-04-03 MED ORDER — LOVASTATIN 40 MG PO TABS: 40.0000 mg | ORAL_TABLET | Freq: Every day | ORAL | Status: DC

## 2012-04-03 MED ORDER — FLUTICASONE PROPIONATE 50 MCG/ACT NA SUSP: 1.0000 | Freq: Every day | NASAL | Status: DC

## 2012-04-03 MED ORDER — HYOSCYAMINE SULFATE 0.125 MG PO TBDP: 0.1250 mg | ORAL_TABLET | Freq: Two times a day (BID) | ORAL | Status: DC | PRN

## 2012-04-03 MED ORDER — LEVOTHYROXINE SODIUM 88 MCG PO TABS: 88.0000 ug | ORAL_TABLET | Freq: Every day | ORAL | Status: DC

## 2012-04-03 NOTE — Telephone Encounter (Signed)
Pt forgot to ask for refills on all meds to be called to [facility] on [address]. I refilled everything except Alprazolam 0.5 mg 1 po bid. Ok to Rf?

## 2012-04-03 NOTE — Progress Notes (Signed)
Subjective:    Patient ID: Brandi Ferguson, female    DOB: [birth year], 66 y.o.   MRN: [id number]  HPI Brandi Ferguson  is here for annual wellness examination and management of other chronic and acute problems. 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   The risk factors are reflected in the social history.  The roster of all physicians providing medical care to patient - is listed in the Snapshot section of the chart.  Activities of daily living:  The patient is 100% inedpendent in all ADLs: dressing, toileting, feeding as well as independent mobility  Home safety : The patient has smoke detectors in the home. Fall - fall-safe home except no grab bars in bathroom. They wear seatbelts. No firearms at home. There is no violence in the home.   There is no risks for hepatitis, STDs or HIV. There is no   history of blood transfusion. They have no travel history to infectious disease endemic areas of the world.  The patient has  seen their dentist in the last six month. They have seen their eye doctor in the last year. They admit to hearing difficulty and have not had audiologic testing in the last year.  They do not  have excessive sun exposure. Discussed the need for sun protection: hats, long sleeves and use of sunscreen if there is significant sun exposure.   Diet: the importance of a healthy diet is discussed. They do have a healthy diet.  The patient has no regular exercise program.  The benefits of regular aerobic exercise were discussed.  Depression screen: there are no signs or vegative symptoms of depression- irritability, change in appetite, anhedonia, sadness/tearfullness.  Cognitive assessment: the patient manages all their financial and personal affairs and is actively engaged.  The following portions of the patient's history were reviewed and updated as appropriate: allergies, current medications, past family history, past  medical history,  past surgical history, past social history  and problem list.  Vision, hearing, body mass index were assessed and reviewed.   Past Medical History  Diagnosis Date  . 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 ANXIETY DEPRESSION 02/06/2008  . Cough 09/27/2008  . DIVERTICULOSIS OF COLON 02/06/2008  . HYPERLIPIDEMIA 06/27/2008  . HYPERTENSION 02/06/2008  . HYPOTHYROIDISM 02/06/2008  . Irritable bowel syndrome 02/06/2008  . Lump or mass in breast 02/06/2008  . Overweight 06/27/2008  . SINUSITIS- ACUTE-NOS 01/04/2010  . URI 09/04/2009   Past Surgical History  Procedure Date  . Breast surgery 1984    Right lumpectomy- Benign  . Tonsillectomy   . Laparoscopy 1972    Fertility work-up  . Cervical cone biopsy 1977  . Skin lesion excision 2006    Left breast   Family History  Problem Relation Age of Onset  . Alzheimer's disease Mother   . Alcohol abuse Father   . Diabetes Father   . Hyperlipidemia Father   . Hypertension Father   . Coronary artery disease Father   . Cancer Sister    History   Social History  . Marital Status: Married    Spouse Name: Ree Kida    Number of Children: 1  . Years of Education: N/A   Occupational History  . elementary school teacher    Social History Main Topics  . Smoking status: Never Smoker   . Smokeless tobacco: Not on file  . Alcohol Use:   . Drug Use:   .  Sexually Active:    Other Topics Concern  . Not on file   Social History Narrative   Married "80", 1 adopted daughter "35". Work Tourist information centre manager. Marriage in [place] [facility]   Current Outpatient Prescriptions on File Prior to Visit  Medication Sig Dispense Refill  . ALPRAZolam (XANAX) 0.5 MG tablet Take 1 tablet (0.5 mg total) by mouth 3 (three) times daily as needed.  90 tablet  5  . aspirin 81 MG tablet Take 81 mg by mouth daily.        Marland Kitchen buPROPion (WELLBUTRIN) 75 MG tablet Take 1 tablet (75 mg total) by mouth 2 (two) times daily.  60 tablet  5  . fluticasone  (FLONASE) 50 MCG/ACT nasal spray Place 1 spray into the nose daily.  16 g  5  . hydrochlorothiazide (HYDRODIURIL) 25 MG tablet Take 1 tablet (25 mg total) by mouth at bedtime.  30 tablet  5  . hyoscyamine (ANASPAZ) 0.125 MG TBDP Place 1 tablet (0.125 mg total) under the tongue 2 (two) times daily as needed.  60 tablet  5  . levothyroxine (SYNTHROID, LEVOTHROID) 88 MCG tablet Take 1 tablet (88 mcg total) by mouth daily.  30 tablet  5  . lovastatin (MEVACOR) 40 MG tablet Take 1 tablet (40 mg total) by mouth at bedtime.  30 tablet  5  . naproxen (NAPROSYN) 500 MG tablet Take 1 tablet (500 mg total) by mouth 2 (two) times daily as needed.  60 tablet  5     During the course of the visit the patient was educated and counseled about appropriate screening and preventive services including : fall prevention , diabetes screening, nutrition counseling, colorectal cancer screening, and recommended immunizations.    Review of Systems Constitutional:  Negative for fever, chills, activity change and unexpected weight change.  HEENT:  Negative for hearing loss, ear pain, congestion, neck stiffness and postnasal drip. Negative for sore throat or swallowing problems. Negative for dental complaints.   Eyes: Negative for vision loss or change in visual acuity.  Respiratory: Negative for chest tightness and wheezing. Negative for DOE.   Cardiovascular: Negative for chest pain or palpitations. No decreased exercise tolerance Gastrointestinal: No change in bowel habit. No bloating or gas. No reflux or indigestion Genitourinary: Negative for urgency, frequency, flank pain and difficulty urinating.  Musculoskeletal: Positive for mild myalgias, back pain, arthralgias. Not limiting and responds to NSAID  Neurological: Negative for dizziness, tremors, weakness and headaches.  Hematological: Negative for adenopathy.  Psychiatric/Behavioral: Negative for behavioral problems and dysphoric mood.       Objective:    Physical Exam Filed Vitals:   04/03/12 1345  BP: 140/86  Pulse: 98  Temp: 97.7 F (36.5 C)  Resp: 16   Wt Readings from Last 3 Encounters:  04/03/12 192 lb 8 oz (87.317 kg)  03/12/12 194 lb (87.998 kg)  05/22/11 188 lb 6 oz (85.446 kg)    Gen'l: well nourished, well developed Woman in no distress HEENT - [place]/AT, EACs/TMs normal, oropharynx with native dentition in good condition, no buccal or palatal lesions, posterior pharynx clear, mucous membranes moist. C&S clear, PERRLA, fundi - normal Neck - supple, no thyromegaly Nodes- negative submental, cervical, supraclavicular regions Chest - no deformity, no CVAT Lungs - cleat without rales, wheezes. No increased work of breathing Breast - deferred to gyn Cardiovascular - regular rate and rhythm, quiet precordium, no murmurs, rubs or gallops, 2+ radial, DP and PT pulses Abdomen - BS+ x 4, no HSM, no  guarding or rebound or tenderness Pelvic - deferred to gyn Rectal - deferred to gyn Extremities - no clubbing, cyanosis, edema or deformity.  Neuro - A&O x 3, CN II-XII normal, motor strength normal and equal, DTRs 2+ and symmetrical biceps, radial, and patellar tendons. Cerebellar - no tremor, no rigidity, fluid movement and normal gait. Derm - Head, neck, back, abdomen and extremities without suspicious lesions - full exam deferred to Dr. Terri Piedra. There are residual erythematous macular lesions in the right axilla.  Lab Results  Component Value Date   WBC 8.5 03/28/2011   HGB 15.5* 03/28/2011   HCT 44.9 03/28/2011   PLT 318.0 03/28/2011   GLUCOSE 110* 04/03/2012   CHOL 124 04/03/2012   TRIG 177.0* 04/03/2012   HDL 47.50 04/03/2012   LDLCALC 41 04/03/2012        ALT 23 04/03/2012   AST 27 04/03/2012        NA 142 04/03/2012   K 3.3* 04/03/2012   CL 103 04/03/2012   CREATININE 1.0 04/03/2012   BUN 15 04/03/2012   CO2 30 04/03/2012   TSH 1.28 04/03/2012   INR 0.95 02/12/2010          Assessment & Plan:

## 2012-04-05 NOTE — Telephone Encounter (Signed)
Thank you. OK to refill xanax x 5

## 2012-04-05 NOTE — Assessment & Plan Note (Signed)
Per TSH thyroid function is in normal range.  Plan Continue present dose of levothyroxine

## 2012-04-05 NOTE — Assessment & Plan Note (Signed)
LDL is extremely well controlled and definitely better than goal of 100 or less and HDL is also at goal. Liver functions are normal  Plan  Continue present medication.

## 2012-04-05 NOTE — Assessment & Plan Note (Signed)
She is aware of the need to control her weight using principles of weight management: smart food choices, PORTION SIZE CONTROL and regular exercise - 3 sessions a week of 30 minutes with a heart rate of 120+.  Target weight- 160 Lbs Goal - to loose 1-2 lbs/month

## 2012-04-05 NOTE — Assessment & Plan Note (Signed)
Interval medical history w/o major illness, surgery or injury. She is current with Gyn, current with colorectal and breast cancer screening. Immunizations - due for pneumonia and shingles vaccines at her convenience  In summary - a very nice woman who appears to be medically stable but who needs a regular exercise program and weight management. She will return as needed or in 1 year.

## 2012-04-05 NOTE — Assessment & Plan Note (Signed)
BP Readings from Last 3 Encounters:  04/03/12 140/86  03/12/12 122/78  05/22/11 110/80   Good control. Kidney function and electrolytes are normal.  Plan Continue present medication.

## 2012-04-06 MED ORDER — ALPRAZOLAM 0.5 MG PO TABS: 0.5000 mg | ORAL_TABLET | Freq: Three times a day (TID) | ORAL | Status: DC | PRN

## 2012-04-06 NOTE — Telephone Encounter (Signed)
Done

## 2012-07-01 ENCOUNTER — Other Ambulatory Visit: Payer: Self-pay | Admitting: Internal Medicine

## 2012-07-01 DIAGNOSIS — Z1231 Encounter for screening mammogram for malignant neoplasm of breast: Secondary | ICD-10-CM

## 2012-07-07 ENCOUNTER — Ambulatory Visit (INDEPENDENT_AMBULATORY_CARE_PROVIDER_SITE_OTHER): Payer: BC Managed Care – PPO | Admitting: Internal Medicine

## 2012-07-07 ENCOUNTER — Encounter: Payer: Self-pay | Admitting: Internal Medicine

## 2012-07-07 VITALS — BP 110/70 | HR 81 | Temp 98.0°F | Resp 16 | Wt 193.0 lb

## 2012-07-07 DIAGNOSIS — J069 Acute upper respiratory infection, unspecified: Secondary | ICD-10-CM

## 2012-07-07 MED ORDER — AZITHROMYCIN 250 MG PO TABS: ORAL_TABLET | ORAL | Status: DC

## 2012-07-07 NOTE — Progress Notes (Signed)
Subjective:    Patient ID: Brandi Ferguson, female    DOB: 09/19/1946, 66 y.o.   MRN: [id number]  HPI Mrs. Sikora presents with sinus pressure and pain, sore throat, post-nasal drainage and has purulent sputum. She has not had any documented fever.  Past Medical History  Diagnosis Date  . 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 ANXIETY DEPRESSION 02/06/2008  . Cough 09/27/2008  . DIVERTICULOSIS OF COLON 02/06/2008  . HYPERLIPIDEMIA 06/27/2008  . HYPERTENSION 02/06/2008  . HYPOTHYROIDISM 02/06/2008  . Irritable bowel syndrome 02/06/2008  . Lump or mass in breast 02/06/2008  . Overweight 06/27/2008  . SINUSITIS- ACUTE-NOS 01/04/2010  . URI 09/04/2009   Past Surgical History  Procedure Date  . Breast surgery 1984    Right lumpectomy- Benign  . Tonsillectomy   . Laparoscopy 1972    Fertility work-up  . Cervical cone biopsy 1977  . Skin lesion excision 2006    Left breast   Family History  Problem Relation Age of Onset  . Alzheimer's disease Mother   . Alcohol abuse Father   . Diabetes Father   . Hyperlipidemia Father   . Hypertension Father   . Coronary artery disease Father   . Cancer Sister    History   Social History  . Marital Status: Married    Spouse Name: Ree Kida    Number of Children: 1  . Years of Education: 16   Occupational History  . elementary school teacher    Social History Main Topics  . Smoking status: Never Smoker   . Smokeless tobacco: Never Used  . Alcohol Use: No  . Drug Use: No  . Sexually Active: Yes -- Female partner(s)   Other Topics Concern  . Not on file   Social History Narrative   HSG, Completed [employer] -ed. Married "5", 1 adopted daughter "97". 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 Marriage in [place] [facility]. 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 Prolonged artificial hydration or nutrition.     Current Outpatient  Prescriptions on File Prior to Visit  Medication Sig Dispense Refill  . ALPRAZolam (XANAX) 0.5 MG tablet Take 1 tablet (0.5 mg total) by mouth 3 (three) times daily as needed.  90 tablet  5  . aspirin 81 MG tablet Take 81 mg by mouth daily.        Marland Kitchen buPROPion (WELLBUTRIN) 75 MG tablet Take 1 tablet (75 mg total) by mouth 2 (two) times daily.  60 tablet  5  . fluticasone (FLONASE) 50 MCG/ACT nasal spray Place 1 spray into the nose daily.  16 g  5  . hydrochlorothiazide (HYDRODIURIL) 25 MG tablet Take 1 tablet (25 mg total) by mouth at bedtime.  30 tablet  5  . hyoscyamine (ANASPAZ) 0.125 MG TBDP Place 1 tablet (0.125 mg total) under the tongue 2 (two) times daily as needed.  60 tablet  5  . levothyroxine (SYNTHROID, LEVOTHROID) 88 MCG tablet Take 1 tablet (88 mcg total) by mouth daily.  30 tablet  5  . lovastatin (MEVACOR) 40 MG tablet Take 1 tablet (40 mg total) by mouth at bedtime.  30 tablet  5  . naproxen (NAPROSYN) 500 MG tablet Take 1 tablet (500 mg total) by mouth 2 (two) times daily as needed.  60 tablet  5      Review of Systems System review is negative for any constitutional,  cardiac, pulmonary, GI or neuro symptoms or complaints other than as described in the HPI.     Objective:   Physical Exam Filed Vitals:   07/07/12 1605  BP: 110/70  Pulse: 81  Temp: 98 F (36.7 C)  Resp: 16  EACs/TMs normal, Mild tenderness to percussion over frontal and maxillary sinus Pulm - normal respirations, no rales or wheezes Cor - RRR Neuro - A&O x 3        Assessment & Plan:  URI Plan z-pak  Supportive care.

## 2012-07-07 NOTE — Patient Instructions (Addendum)
Upper respiratory infection - plan: Z-pak; over the counter decongestant - sudafed 30 mg 2 or 3 times a day; hydrate; vitamin C; gargle of choice.

## 2012-08-05 ENCOUNTER — Ambulatory Visit
Admission: RE | Admit: 2012-08-05 | Discharge: 2012-08-05 | Disposition: A | Discharge status: Home / Self Care | Payer: BC Managed Care – PPO | Source: Ambulatory Visit | Attending: Internal Medicine | Admitting: Internal Medicine

## 2012-08-05 DIAGNOSIS — Z1231 Encounter for screening mammogram for malignant neoplasm of breast: Secondary | ICD-10-CM

## 2012-10-02 ENCOUNTER — Other Ambulatory Visit: Payer: Self-pay | Admitting: Internal Medicine

## 2012-10-16 ENCOUNTER — Other Ambulatory Visit: Payer: Self-pay | Admitting: *Deleted

## 2012-10-17 MED ORDER — ALPRAZOLAM 0.5 MG PO TABS: 0.5000 mg | ORAL_TABLET | Freq: Three times a day (TID) | ORAL | Status: DC | PRN

## 2012-10-21 ENCOUNTER — Other Ambulatory Visit: Payer: Self-pay | Admitting: *Deleted

## 2012-10-21 NOTE — Telephone Encounter (Signed)
Pt called stating that she is out of her alprazolam. She stated [facility] sent the refill request last Friday, Jan. 3rd. Upon looking into the pt's record. Request was filled on 10/16/12 per escribe. Called [facility] and left a detailed vm for rx refill. Called pt and lvm letting her know that the rx was sent, but now called in to [facility].

## 2012-11-17 ENCOUNTER — Ambulatory Visit (INDEPENDENT_AMBULATORY_CARE_PROVIDER_SITE_OTHER): Payer: BC Managed Care – PPO | Admitting: Internal Medicine

## 2012-11-17 ENCOUNTER — Encounter: Payer: Self-pay | Admitting: Internal Medicine

## 2012-11-17 VITALS — BP 112/86 | HR 96 | Temp 98.0°F

## 2012-11-17 DIAGNOSIS — J019 Acute sinusitis, unspecified: Secondary | ICD-10-CM

## 2012-11-17 MED ORDER — DOXYCYCLINE HYCLATE 100 MG PO TABS: 100.0000 mg | ORAL_TABLET | Freq: Two times a day (BID) | ORAL | Status: DC

## 2012-11-17 NOTE — Progress Notes (Signed)
HPI  Pt presents to the clinic today with 2 week history of nasal congestion, headache, sinus pain and sore throat. She has been taking Sudafed OTC with some relief. She has also been using her flonase. The symptoms seem to have gotten rapidly worse yesterday with fatigue and fever. She does have a history of allergies and does occasionally get sinus infections. She has had sick contacts.  Review of Systems    Past Medical History  Diagnosis Date  . 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 ANXIETY DEPRESSION 02/06/2008  . Cough 09/27/2008  . DIVERTICULOSIS OF COLON 02/06/2008  . HYPERLIPIDEMIA 06/27/2008  . HYPERTENSION 02/06/2008  . HYPOTHYROIDISM 02/06/2008  . Irritable bowel syndrome 02/06/2008  . Lump or mass in breast 02/06/2008  . Overweight 06/27/2008  . SINUSITIS- ACUTE-NOS 01/04/2010  . URI 09/04/2009    Family History  Problem Relation Age of Onset  . Alzheimer's disease Mother   . Alcohol abuse Father   . Diabetes Father   . Hyperlipidemia Father   . Hypertension Father   . Coronary artery disease Father   . Cancer Sister     History   Social History  . Marital Status: Married    Spouse Name: Ree Kida    Number of Children: 1  . Years of Education: 16   Occupational History  . elementary school teacher    Social History Main Topics  . Smoking status: Never Smoker   . Smokeless tobacco: Never Used  . Alcohol Use: No  . Drug Use: No  . Sexually Active: Yes -- Female partner(s)   Other Topics Concern  . Not on file   Social History Narrative   HSG, Completed [employer] -ed. Married "69", 1 adopted daughter "50". 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 Marriage in [place]. 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 Prolonged artificial hydration or nutrition.     No Known Allergies   Constitutional: Positive headache, fatigue and fever. Denies abrupt  weight changes.  HEENT:  Positive eye pain, pressure behind the eyes, facial pain, nasal congestion and sore throat. Denies eye redness, ear pain, ringing in the ears, wax buildup, runny nose or bloody nose. Respiratory: Positive cough. Denies difficulty breathing or shortness of breath.  Cardiovascular: Denies chest pain, chest tightness, palpitations or swelling in the hands or feet.   No other specific complaints in a complete review of systems (except as listed in HPI above).  Objective:    BP 112/86  Pulse 96  Temp 98 F (36.7 C) (Oral)  SpO2 96% Wt Readings from Last 3 Encounters:  07/07/12 193 lb (87.544 kg)  04/03/12 192 lb 8 oz (87.317 kg)  03/12/12 194 lb (87.998 kg)    General: Appears her stated age, well developed, well nourished in NAD. HEENT: Head: normal shape and size; Eyes: sclera white, no icterus, conjunctiva pink, PERRLA and EOMs intact; Ears: Tm's gray and intact, normal light reflex; Nose: mucosa pink and moist, septum midline; Throat/Mouth: + PND. Teeth present, mucosa erythematous and moist, no exudate noted, no lesions or ulcerations noted.  Neck: Mild cervical lymphadenopathy. Neck supple, trachea midline. No massses, lumps or thyromegaly present.  Cardiovascular: Normal rate and rhythm. S1,S2 noted.  No murmur, rubs or gallops noted. No JVD or BLE edema. No carotid bruits noted. Pulmonary/Chest: Normal effort and positive vesicular breath sounds. No respiratory distress. No wheezes, rales or  ronchi noted.      Assessment & Plan:   Acute bacterial sinusitis  Can use a Neti Pot which can be purchased from your local drug store. Flonase 2 sprays each nostril for 3 days and then as needed. Doxycycline BID for 10 days  RTC as needed or if symptoms persist.

## 2012-11-17 NOTE — Patient Instructions (Signed)

## 2013-04-01 ENCOUNTER — Other Ambulatory Visit: Payer: Self-pay | Admitting: Obstetrics and Gynecology

## 2013-04-02 ENCOUNTER — Other Ambulatory Visit: Payer: Self-pay | Admitting: Internal Medicine

## 2013-04-14 ENCOUNTER — Other Ambulatory Visit: Payer: Self-pay

## 2013-04-14 MED ORDER — ALPRAZOLAM 0.5 MG PO TABS: 0.5000 mg | ORAL_TABLET | Freq: Three times a day (TID) | ORAL | Status: DC | PRN

## 2013-04-14 NOTE — Telephone Encounter (Signed)
Alprazolam called to pharmacy  

## 2013-05-17 ENCOUNTER — Ambulatory Visit (INDEPENDENT_AMBULATORY_CARE_PROVIDER_SITE_OTHER): Payer: BC Managed Care – PPO | Admitting: Internal Medicine

## 2013-05-17 ENCOUNTER — Other Ambulatory Visit (INDEPENDENT_AMBULATORY_CARE_PROVIDER_SITE_OTHER): Payer: BC Managed Care – PPO

## 2013-05-17 ENCOUNTER — Encounter: Payer: Self-pay | Admitting: Internal Medicine

## 2013-05-17 VITALS — BP 144/82 | HR 73 | Temp 97.2°F | Ht 62.0 in | Wt 180.0 lb

## 2013-05-17 DIAGNOSIS — F341 Dysthymic disorder: Secondary | ICD-10-CM

## 2013-05-17 DIAGNOSIS — D126 Benign neoplasm of colon, unspecified: Secondary | ICD-10-CM

## 2013-05-17 DIAGNOSIS — I10 Essential (primary) hypertension: Secondary | ICD-10-CM

## 2013-05-17 DIAGNOSIS — E663 Overweight: Secondary | ICD-10-CM

## 2013-05-17 DIAGNOSIS — E785 Hyperlipidemia, unspecified: Secondary | ICD-10-CM

## 2013-05-17 DIAGNOSIS — E039 Hypothyroidism, unspecified: Secondary | ICD-10-CM

## 2013-05-17 DIAGNOSIS — Z0000 Encounter for general adult medical examination without abnormal findings: Secondary | ICD-10-CM

## 2013-05-17 LAB — COMPREHENSIVE METABOLIC PANEL
Albumin: 3.9 g/dL (ref 3.5–5.2)
BUN: 14 mg/dL (ref 6–23)
CO2: 25 mEq/L (ref 19–32)
Calcium: 9.6 mg/dL (ref 8.4–10.5)
Chloride: 108 mEq/L (ref 96–112)
GFR: 68.59 mL/min (ref >60.00)
Glucose, Bld: 91 mg/dL (ref 70–99)
Potassium: 3.6 mEq/L (ref 3.5–5.1)
Sodium: 141 mEq/L (ref 135–145)
Total Protein: 7.2 g/dL (ref 6.0–8.3)

## 2013-05-17 LAB — LIPID PANEL
Cholesterol: 135 mg/dL (ref 0–200)
LDL Cholesterol: 71 mg/dL (ref 0–99)
Triglycerides: 103 mg/dL (ref 0.0–149.0)

## 2013-05-17 LAB — HEPATIC FUNCTION PANEL
Alkaline Phosphatase: 63 U/L (ref 39–117)
Bilirubin, Direct: 0.1 mg/dL (ref 0.0–0.3)
Total Bilirubin: 0.7 mg/dL (ref 0.3–1.2)

## 2013-05-17 MED ORDER — CEPHALEXIN 500 MG PO CAPS: 500.0000 mg | ORAL_CAPSULE | Freq: Four times a day (QID) | ORAL | Status: DC

## 2013-05-17 MED ORDER — FLUTICASONE PROPIONATE 50 MCG/ACT NA SUSP: 1.0000 | Freq: Every day | NASAL | Status: DC

## 2013-05-17 NOTE — Assessment & Plan Note (Signed)
Taking and tolerating medication.   Plan F/u lipid panel with recommendations to follow

## 2013-05-17 NOTE — Assessment & Plan Note (Signed)
Since retirement she is doing much better.  Plan Continue Wellbutrin

## 2013-05-17 NOTE — Patient Instructions (Addendum)
Thanks for coming to see me. You seem to be calm and collected. Something to do with retirement???  Your exam is normal, leaving to the Gyn to Dr. Jed Limerick.  Lab today: results will be available on MyChart along with comments/recommendations  You are up to date with colorectal cancer screening and breast cancer screening. You have had tetanus. Please reconsider shingles and pneumonia vaccine. You can get more information from [email or web address] or [email or web address].  If all goes well we'll see you next year.

## 2013-05-17 NOTE — Assessment & Plan Note (Signed)
Interval history is negative for any major illness or surgery. Limited physical exam is normal except for weight. She is current with colorectal and breast cancer screening. She is s/p hysterectomy but had an abnormal PAP smear June '13 with CIN-1. Last PAP smear June '14 was normal. Immunizations: current with tetanus, offered and she declines pneumonia and shingles vaccine.  In summary - a very nice woman who appears medically stable. She will return as needed or in 1 year.

## 2013-05-17 NOTE — Assessment & Plan Note (Signed)
Asymptomatic. Taking and tolerating medication  Plan Lab: TSH, FT4 with recommendations to follow

## 2013-05-17 NOTE — Assessment & Plan Note (Signed)
BP Readings from Last 3 Encounters:  05/17/13 144/82  11/17/12 112/86  07/07/12 110/70   A bit high today but adequate control on single agent - diuretic.  Plan Routine follow up lab  Continue present medication

## 2013-05-17 NOTE — Assessment & Plan Note (Signed)
Brandi Ferguson has lost 13 lbs since Sept '13. This is on target for weight loss of 1 lb per month.  Plan Continue weight management program: Diet management: smart food choices, PORTION SIZE CONTROL, regular exercise. Goal - to loose 1-2 lbs.month. Target weight - 165 lbs

## 2013-05-17 NOTE — Assessment & Plan Note (Signed)
Due for follow up colonoscopy in 2016

## 2013-05-17 NOTE — Progress Notes (Signed)
Subjective:    Patient ID: Christiane Ha, female    DOB: [birth year], 67 y.o.   MRN: [id number]  HPI Mrs. Genao presents for a routine exam. She has recurrent axillary abscess right. She has been doing well.  Had recent gyn exam and PAP smear was normal June '14 and had a normal manual exam. She also had a normal breast exam. Last mammogram Oct '13. She is enjoying her retirement.   Past Medical History  Diagnosis Date  . 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 ANXIETY DEPRESSION 02/06/2008  . Cough 09/27/2008  . DIVERTICULOSIS OF COLON 02/06/2008  . HYPERLIPIDEMIA 06/27/2008  . HYPERTENSION 02/06/2008  . HYPOTHYROIDISM 02/06/2008  . Irritable bowel syndrome 02/06/2008  . Lump or mass in breast 02/06/2008  . Overweight(278.02) 06/27/2008  . SINUSITIS- ACUTE-NOS 01/04/2010  . URI 09/04/2009   Past Surgical History  Procedure Laterality Date  . Breast surgery  1984    Right lumpectomy- Benign  . Tonsillectomy    . Laparoscopy  1972    Fertility work-up  . Cervical cone biopsy  1977  . Skin lesion excision  2006    Left breast   Family History  Problem Relation Age of Onset  . Alzheimer's disease Mother   . Alcohol abuse Father   . Diabetes Father   . Hyperlipidemia Father   . Hypertension Father   . Coronary artery disease Father   . Cancer Sister    History   Social History  . Marital Status: Married    Spouse Name: Ree Kida    Number of Children: 1  . Years of Education: 16   Occupational History  . elementary school teacher    Social History Main Topics  . Smoking status: Never Smoker   . Smokeless tobacco: Never Used  . Alcohol Use: No  . Drug Use: No  . Sexually Active: Yes -- Female partner(s)   Other Topics Concern  . Not on file   Social History Narrative   HSG, Completed [employer] -ed. Married "55", 1 adopted daughter "70". 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 Marriage in [place] [facility]. ACP- yes   CPR, yes for short-term mechanical and ICU care, no for long term heroic measures, i.e. Prolonged artificial hydration or nutrition.     Current Outpatient Prescriptions on File Prior to Visit  Medication Sig Dispense Refill  . ALPRAZolam (XANAX) 0.5 MG tablet Take 1 tablet (0.5 mg total) by mouth 3 (three) times daily as needed.  90 tablet  5  . aspirin 81 MG tablet Take 81 mg by mouth daily.        Marland Kitchen buPROPion (WELLBUTRIN) 75 MG tablet TAKE ONE TABLET BY MOUTH TWICE DAILY  60 tablet  5  . fluticasone (FLONASE) 50 MCG/ACT nasal spray Place 1 spray into the nose daily.  16 g  5  . hydrochlorothiazide (HYDRODIURIL) 25 MG tablet TAKE 1 TABLET NIGHTLY AT BEDTIME AS DIRECTED  30 tablet  5  . hyoscyamine (ANASPAZ) 0.125 MG TBDP Place 1 tablet (0.125 mg total) under the tongue 2 (two) times daily as needed.  60 tablet  5  . levothyroxine (SYNTHROID, LEVOTHROID) 88 MCG tablet TAKE ONE TABLET BY MOUTH ONE TIME DAILY  30 tablet  5  . lovastatin (MEVACOR) 40 MG tablet TAKE ONE TABLET BY MOUTH NIGHTLY AT BEDTIME  30 tablet  5  . naproxen (NAPROSYN) 500 MG tablet Take 1 tablet (500 mg total) by mouth  2 (two) times daily as needed.  60 tablet  5   No current facility-administered medications on file prior to visit.      Review of Systems Constitutional:  Negative for fever, chills, activity change and unexpected weight change.  HEENT:  Negative for hearing loss, ear pain, congestion, neck stiffness and postnasal drip. Negative for sore throat or swallowing problems. Negative for dental complaints.   Eyes: Negative for vision loss or change in visual acuity.  Respiratory: Negative for chest tightness and wheezing. Negative for DOE.   Cardiovascular: Negative for chest pain or palpitations. No decreased exercise tolerance Gastrointestinal: No change in bowel habit. No bloating or gas. No reflux or indigestion Genitourinary: Negative for urgency, frequency, flank pain and difficulty urinating.   Musculoskeletal: Negative for myalgias, back pain, arthralgias and gait problem.  Neurological: Negative for dizziness, tremors, weakness and headaches.  Hematological: Negative for adenopathy.  Psychiatric/Behavioral: Negative for behavioral problems and dysphoric mood.       Objective:   Physical Exam Filed Vitals:   05/17/13 0851  BP: 144/82  Pulse: 73  Temp: 97.2 F (36.2 C)   Wt Readings from Last 3 Encounters:  05/17/13 180 lb (81.647 kg)  07/07/12 193 lb (87.544 kg)  04/03/12 192 lb 8 oz (87.317 kg)   Gen'l: well nourished, well developed, overweight Woman in no distress HEENT - [place]/AT, EACs/TMs normal, oropharynx with native dentition in good condition, no buccal or palatal lesions, posterior pharynx clear, mucous membranes moist. C&S clear, PERRLA, fundi - normal Neck - supple, no thyromegaly Nodes- negative submental, cervical, supraclavicular regions Chest - no deformity, no CVAT Lungs - clear without rales, wheezes. No increased work of breathing Breast - deferred to gyn Cardiovascular - regular rate and rhythm, quiet precordium, no murmurs, rubs or gallops, 2+ radial, DP and PT pulses Abdomen - BS+ x 4, no HSM, no guarding or rebound or tenderness Pelvic - deferred to gyn Rectal - deferred to gyn Extremities - no clubbing, cyanosis, edema or deformity.  Neuro - A&O x 3, CN II-XII normal, motor strength normal and equal, DTRs 2+ and symmetrical biceps, radial, and patellar tendons. Cerebellar - no tremor, no rigidity, fluid movement and normal gait. Derm - Head, neck, back, abdomen and extremities without suspicious lesions        Assessment & Plan:

## 2013-06-07 ENCOUNTER — Other Ambulatory Visit: Payer: Self-pay | Admitting: Internal Medicine

## 2013-07-01 ENCOUNTER — Other Ambulatory Visit: Payer: Self-pay

## 2013-07-01 DIAGNOSIS — Z1231 Encounter for screening mammogram for malignant neoplasm of breast: Secondary | ICD-10-CM

## 2013-07-27 ENCOUNTER — Ambulatory Visit (INDEPENDENT_AMBULATORY_CARE_PROVIDER_SITE_OTHER): Payer: Medicare Other | Admitting: Internal Medicine

## 2013-07-27 ENCOUNTER — Encounter: Payer: Self-pay | Admitting: Internal Medicine

## 2013-07-27 VITALS — BP 140/84 | HR 80 | Temp 98.3°F | Wt 184.8 lb

## 2013-07-27 DIAGNOSIS — J069 Acute upper respiratory infection, unspecified: Secondary | ICD-10-CM

## 2013-07-27 MED ORDER — AMOXICILLIN 875 MG PO TABS: 875.0000 mg | ORAL_TABLET | Freq: Two times a day (BID) | ORAL | Status: DC

## 2013-07-27 NOTE — Progress Notes (Signed)
  Subjective:    Patient ID: Brandi Ferguson, female    DOB: 04-22-1946, 67 y.o.   MRN: [id number]  HPI Brandi Ferguson presnts for a 3 day h/o temperature above her normal, sore throat, sinus pressure, no rhinorrhea but occasion mucus which is green, fatigue, malaise, sore throat. She has been taking ibuprofen, otc antihistamine. No N/V/D.  No SOB.  PMH, FamHx and SocHx reviewed for any changes and relevance.  Current Outpatient Prescriptions on File Prior to Visit  Medication Sig Dispense Refill  . ALPRAZolam (XANAX) 0.5 MG tablet Take 1 tablet (0.5 mg total) by mouth 3 (three) times daily as needed.  90 tablet  5  . aspirin 81 MG tablet Take 81 mg by mouth daily.        Marland Kitchen buPROPion (WELLBUTRIN) 75 MG tablet TAKE ONE TABLET BY MOUTH TWICE DAILY  60 tablet  5  . fluticasone (FLONASE) 50 MCG/ACT nasal spray Place 1 spray into the nose daily.  16 g  5  . hydrochlorothiazide (HYDRODIURIL) 25 MG tablet TAKE 1 TABLET NIGHTLY AT BEDTIME AS DIRECTED  30 tablet  5  . hyoscyamine (ANASPAZ) 0.125 MG TBDP Place 1 tablet (0.125 mg total) under the tongue 2 (two) times daily as needed.  60 tablet  5  . hyoscyamine (LEVSIN SL) 0.125 MG SL tablet Place 1 tablet (0.125 mg total) under the tongue 2 (two) times daily a s needed.  60 tablet  4  . levothyroxine (SYNTHROID, LEVOTHROID) 88 MCG tablet TAKE ONE TABLET BY MOUTH ONE TIME DAILY  30 tablet  5  . lovastatin (MEVACOR) 40 MG tablet TAKE ONE TABLET BY MOUTH NIGHTLY AT BEDTIME  30 tablet  5  . naproxen (NAPROSYN) 500 MG tablet Take 1 tablet (500 mg total) by mouth 2 (two) times daily as needed.  60 tablet  5   No current facility-administered medications on file prior to visit.      Review of Systems System review is negative for any constitutional, cardiac, pulmonary, GI or neuro symptoms or complaints other than as described in the HPI.     Objective:   Physical Exam Filed Vitals:   07/27/13 1524  BP: 140/84  Pulse: 80  Temp: 98.3 F (36.8 C)    Wt Readings from Last 3 Encounters:  07/27/13 184 lb 12.8 oz (83.825 kg)  05/17/13 180 lb (81.647 kg)  07/07/12 193 lb (87.544 kg)   gen'l- o verweight woman in no distress HEENT- mild-moderate sinus tenderness to percussion, TMs normal, throat -could not visualize Nodes - negative Cor- RRR Pulm - normal respirations, Lungs CTAP       Assessment & Plan:  Upper respiratory infection - mild to moderate  Plan Amoxicillin 875 mg twice a day for 7 days  Sudafed (generic) 30 mg twice a day  Nasal saline spray  Tylenol 500 to 1,000 mg three times a day  Rest, vitamin C, hydrate.

## 2013-07-27 NOTE — Patient Instructions (Addendum)
Upper respiratory infection - mild to moderate  Plan Amoxicillin 875 mg twice a day for 7 days  Sudafed (generic) 30 mg twice a day  Nasal saline spray  Tylenol 500 to 1,000 mg three times a day  Rest, vitamin C, hydrate.   Upper Respiratory Infection, Adult An upper respiratory infection (URI) is also sometimes known as the common cold. The upper respiratory tract includes the nose, sinuses, throat, trachea, and bronchi. Bronchi are the airways leading to the lungs. Most people improve within 1 week, but symptoms can last up to 2 weeks. A residual cough may last even longer.  CAUSES Many different viruses can infect the tissues lining the upper respiratory tract. The tissues become irritated and inflamed and often become very moist. Mucus production is also common. A cold is contagious. You can easily spread the virus to others by oral contact. This includes kissing, sharing a glass, coughing, or sneezing. Touching your mouth or nose and then touching a surface, which is then touched by another person, can also spread the virus. SYMPTOMS  Symptoms typically develop 1 to 3 days after you come in contact with a cold virus. Symptoms vary from person to person. They may include:  Runny nose.  Sneezing.  Nasal congestion.  Sinus irritation.  Sore throat.  Loss of voice (laryngitis).  Cough.  Fatigue.  Muscle aches.  Loss of appetite.  Headache.  Low-grade fever. DIAGNOSIS  You might diagnose your own cold based on familiar symptoms, since most people get a cold 2 to 3 times a year. Your caregiver can confirm this based on your exam. Most importantly, your caregiver can check that your symptoms are not due to another disease such as strep throat, sinusitis, pneumonia, asthma, or epiglottitis. Blood tests, throat tests, and X-rays are not necessary to diagnose a common cold, but they may sometimes be helpful in excluding other more serious diseases. Your caregiver will decide if any  further tests are required. RISKS AND COMPLICATIONS  You may be at risk for a more severe case of the common cold if you smoke cigarettes, have chronic heart disease (such as heart failure) or lung disease (such as asthma), or if you have a weakened immune system. The very young and very old are also at risk for more serious infections. Bacterial sinusitis, middle ear infections, and bacterial pneumonia can complicate the common cold. The common cold can worsen asthma and chronic obstructive pulmonary disease (COPD). Sometimes, these complications can require emergency medical care and may be life-threatening. PREVENTION  The best way to protect against getting a cold is to practice good hygiene. Avoid oral or hand contact with people with cold symptoms. Wash your hands often if contact occurs. There is no clear evidence that vitamin C, vitamin E, echinacea, or exercise reduces the chance of developing a cold. However, it is always recommended to get plenty of rest and practice good nutrition. TREATMENT  Treatment is directed at relieving symptoms. There is no cure. Antibiotics are not effective, because the infection is caused by a virus, not by bacteria. Treatment may include:  Increased fluid intake. Sports drinks offer valuable electrolytes, sugars, and fluids.  Breathing heated mist or steam (vaporizer or shower).  Eating chicken soup or other clear broths, and maintaining good nutrition.  Getting plenty of rest.  Using gargles or lozenges for comfort.  Controlling fevers with ibuprofen or acetaminophen as directed by your caregiver.  Increasing usage of your inhaler if you have asthma. Zinc gel and zinc  lozenges, taken in the first 24 hours of the common cold, can shorten the duration and lessen the severity of symptoms. Pain medicines may help with fever, muscle aches, and throat pain. A variety of non-prescription medicines are available to treat congestion and runny nose. Your caregiver  can make recommendations and may suggest nasal or lung inhalers for other symptoms.  HOME CARE INSTRUCTIONS   Only take over-the-counter or prescription medicines for pain, discomfort, or fever as directed by your caregiver.  Use a warm mist humidifier or inhale steam from a shower to increase air moisture. This may keep secretions moist and make it easier to breathe.  Drink enough water and fluids to keep your urine clear or pale yellow.  Rest as needed.  Return to work when your temperature has returned to normal or as your caregiver advises. You may need to stay home longer to avoid infecting others. You can also use a face mask and careful hand washing to prevent spread of the virus. SEEK MEDICAL CARE IF:   After the first few days, you feel you are getting worse rather than better.  You need your caregiver's advice about medicines to control symptoms.  You develop chills, worsening shortness of breath, or brown or red sputum. These may be signs of pneumonia.  You develop yellow or brown nasal discharge or pain in the face, especially when you bend forward. These may be signs of sinusitis.  You develop a fever, swollen neck glands, pain with swallowing, or white areas in the back of your throat. These may be signs of strep throat. SEEK IMMEDIATE MEDICAL CARE IF:   You have a fever.  You develop severe or persistent headache, ear pain, sinus pain, or chest pain.  You develop wheezing, a prolonged cough, cough up blood, or have a change in your usual mucus (if you have chronic lung disease).  You develop sore muscles or a stiff neck. Document Released: 03/26/2001 Document Revised: 12/23/2011 Document Reviewed: 02/01/2011 [facility] Patient Information 2014 [place], [place].

## 2013-08-11 ENCOUNTER — Ambulatory Visit: Payer: BC Managed Care – PPO

## 2013-08-11 ENCOUNTER — Ambulatory Visit
Admission: RE | Admit: 2013-08-11 | Discharge: 2013-08-11 | Disposition: A | Discharge status: Home / Self Care | Payer: Medicare Other | Source: Ambulatory Visit

## 2013-08-11 DIAGNOSIS — Z1231 Encounter for screening mammogram for malignant neoplasm of breast: Secondary | ICD-10-CM

## 2013-08-19 ENCOUNTER — Other Ambulatory Visit: Payer: Self-pay

## 2013-09-07 ENCOUNTER — Encounter: Payer: Self-pay | Admitting: Internal Medicine

## 2013-09-07 ENCOUNTER — Ambulatory Visit (INDEPENDENT_AMBULATORY_CARE_PROVIDER_SITE_OTHER): Payer: Medicare Other | Admitting: Internal Medicine

## 2013-09-07 VITALS — BP 148/90 | HR 76 | Temp 97.1°F | Wt 182.4 lb

## 2013-09-07 DIAGNOSIS — J069 Acute upper respiratory infection, unspecified: Secondary | ICD-10-CM

## 2013-09-07 MED ORDER — PROMETHAZINE-CODEINE 6.25-10 MG/5ML PO SYRP: 5.0000 mL | ORAL_SOLUTION | ORAL | Status: DC | PRN

## 2013-09-07 MED ORDER — AZITHROMYCIN 500 MG PO TABS: 500.0000 mg | ORAL_TABLET | Freq: Every day | ORAL | Status: DC

## 2013-09-07 NOTE — Progress Notes (Signed)
  Subjective:    Patient ID: Brandi Ferguson, female    DOB: 11/26/1945, 67 y.o.   MRN: [id number]  HPI Presents for acute URI symptoms of sinus congestion/pressure, coughing up green mucus, chills, myalgias - FLS.  PMH, FamHx and SocHx reviewed for any changes and relevance. Current Outpatient Prescriptions on File Prior to Visit  Medication Sig Dispense Refill  . ALPRAZolam (XANAX) 0.5 MG tablet Take 1 tablet (0.5 mg total) by mouth 3 (three) times daily as needed.  90 tablet  5  . aspirin 81 MG tablet Take 81 mg by mouth daily.        Marland Kitchen buPROPion (WELLBUTRIN) 75 MG tablet TAKE ONE TABLET BY MOUTH TWICE DAILY  60 tablet  5  . fluticasone (FLONASE) 50 MCG/ACT nasal spray Place 1 spray into the nose daily.  16 g  5  . hydrochlorothiazide (HYDRODIURIL) 25 MG tablet TAKE 1 TABLET NIGHTLY AT BEDTIME AS DIRECTED  30 tablet  5  . hyoscyamine (ANASPAZ) 0.125 MG TBDP Place 1 tablet (0.125 mg total) under the tongue 2 (two) times daily as needed.  60 tablet  5  . levothyroxine (SYNTHROID, LEVOTHROID) 88 MCG tablet TAKE ONE TABLET BY MOUTH ONE TIME DAILY  30 tablet  5  . lovastatin (MEVACOR) 40 MG tablet TAKE ONE TABLET BY MOUTH NIGHTLY AT BEDTIME  30 tablet  5  . naproxen (NAPROSYN) 500 MG tablet Take 1 tablet (500 mg total) by mouth 2 (two) times daily as needed.  60 tablet  5   No current facility-administered medications on file prior to visit.      Review of Systems System review is negative for any constitutional, cardiac, pulmonary, GI or neuro symptoms or complaints other than as described in the HPI.     Objective:   Physical Exam Filed Vitals:   09/07/13 1510  BP: 148/90  Pulse: 76  Temp: 97.1 F (36.2 C)   HEENT- no facial tenderness, TMs normal, throat without erythem Nodes negative Cor- RRR Pulm - CTAP       Assessment & Plan:  Upper respiratory infection - may be viral in the absence of fever, red ear drums, pus in the throat, absence of enlarged lymph nodes. But,  it could be bacterial.   Plan Azithromycin as directed: 500 mg daily x 3 days  Mucinex 1200 mg twice a day to help thin the mucus  Phenergan with codeine 1 tsp every 6 hours as needed for cough  Sudafed (generic) 30 mg two or three times a day for congestion  Ecchinacea - as juice or capsules or tea  Vitamin C 1500 mg daily  Hydrate  Stove top vaporizer.

## 2013-09-07 NOTE — Progress Notes (Signed)
Pre visit review using our clinic review tool, if applicable. No additional management support is needed unless otherwise documented below in the visit note. 

## 2013-09-07 NOTE — Patient Instructions (Signed)
Upper respiratory infection - may be viral in the absence of fever, red ear drums, pus in the throat, absence of enlarged lymph nodes. But, it could be bacterial.   Plan Azithromycin as directed: 500 mg daily x 3 days  Mucinex 1200 mg twice a day to help thin the mucus  Phenergan with codeine 1 tsp every 6 hours as needed for cough  Sudafed (generic) 30 mg two or three times a day for congestion  Ecchinacea - as juice or capsules or tea  Vitamin C 1500 mg daily  Hydrate  Stove top vaporizer.

## 2013-09-08 ENCOUNTER — Telehealth: Payer: Self-pay

## 2013-09-08 NOTE — Telephone Encounter (Signed)
Message copied by Noreene Larsson on Wed Sep 08, 2013  8:49 AM ------      Message from: Illene Regulus E      Created: Wed Sep 08, 2013  4:36 AM       Please mail her the MOST form marked sample along with the short packet on advanced care planning.            Thank you ------

## 2013-09-08 NOTE — Telephone Encounter (Signed)
This info has been mailed to patient

## 2013-09-21 ENCOUNTER — Telehealth: Payer: Self-pay

## 2013-09-21 ENCOUNTER — Encounter: Payer: Self-pay | Admitting: Internal Medicine

## 2013-09-21 ENCOUNTER — Ambulatory Visit (INDEPENDENT_AMBULATORY_CARE_PROVIDER_SITE_OTHER): Payer: Medicare Other | Admitting: Internal Medicine

## 2013-09-21 VITALS — BP 126/80 | HR 92 | Temp 97.3°F | Wt 183.0 lb

## 2013-09-21 DIAGNOSIS — J209 Acute bronchitis, unspecified: Secondary | ICD-10-CM

## 2013-09-21 MED ORDER — AMOXICILLIN-POT CLAVULANATE 875-125 MG PO TABS: 1.0000 | ORAL_TABLET | Freq: Two times a day (BID) | ORAL | Status: DC

## 2013-09-21 MED ORDER — BENZONATATE 100 MG PO CAPS: 100.0000 mg | ORAL_CAPSULE | Freq: Three times a day (TID) | ORAL | Status: DC | PRN

## 2013-09-21 NOTE — Telephone Encounter (Signed)
Per Dr Debby Bud please add patient on and also call patient. Thanks

## 2013-09-21 NOTE — Telephone Encounter (Signed)
May add on late: after 5. We have a registrar until 6 or so.

## 2013-09-21 NOTE — Patient Instructions (Signed)
Bronchitis - no pneumonia. Not having a fever is a good sign.  Plan Augmentin 875 mg twice a day for 10 days  Continue phenergan w/ codeine  Benzonatate perles 100 mg three times a day - helps with the tickle cough  Mucinex is good - 1200 mg twice a day  Hydrate  Tylenol 1,000 mg three times a day for aches, fevers.   Acute Bronchitis Bronchitis is inflammation of the airways that extend from the windpipe into the lungs (bronchi). The inflammation often causes mucus to develop. This leads to a cough, which is the most common symptom of bronchitis.  In acute bronchitis, the condition usually develops suddenly and goes away over time, usually in a couple weeks. Smoking, allergies, and asthma can make bronchitis worse. Repeated episodes of bronchitis may cause further lung problems.  CAUSES Acute bronchitis is most often caused by the same virus that causes a cold. The virus can spread from person to person (contagious).  SIGNS AND SYMPTOMS   Cough.   Fever.   Coughing up mucus.   Body aches.   Chest congestion.   Chills.   Shortness of breath.   Sore throat.  DIAGNOSIS  Acute bronchitis is usually diagnosed through a physical exam. Tests, such as chest X-rays, are sometimes done to rule out other conditions.  TREATMENT  Acute bronchitis usually goes away in a couple weeks. Often times, no medical treatment is necessary. Medicines are sometimes given for relief of fever or cough. Antibiotics are usually not needed but may be prescribed in certain situations. In some cases, an inhaler may be recommended to help reduce shortness of breath and control the cough. A cool mist vaporizer may also be used to help thin bronchial secretions and make it easier to clear the chest.  HOME CARE INSTRUCTIONS  Get plenty of rest.   Drink enough fluids to keep your urine clear or pale yellow (unless you have a medical condition that requires fluid restriction). Increasing fluids may help  thin your secretions and will prevent dehydration.   Only take over-the-counter or prescription medicines as directed by your health care provider.   Avoid smoking and secondhand smoke. Exposure to cigarette smoke or irritating chemicals will make bronchitis worse. If you are a smoker, consider using nicotine gum or skin patches to help control withdrawal symptoms. Quitting smoking will help your lungs heal faster.   Reduce the chances of another bout of acute bronchitis by washing your hands frequently, avoiding people with cold symptoms, and trying not to touch your hands to your mouth, nose, or eyes.   Follow up with your health care provider as directed.  SEEK MEDICAL CARE IF: Your symptoms do not improve after 1 week of treatment.  SEEK IMMEDIATE MEDICAL CARE IF:  You develop an increased fever or chills.   You have chest pain.   You have severe shortness of breath.  You have bloody sputum.   You develop dehydration.  You develop fainting.  You develop repeated vomiting.  You develop a severe headache. MAKE SURE YOU:   Understand these instructions.  Will watch your condition.  Will get help right away if you are not doing well or get worse. Document Released: 11/07/2004 Document Revised: 06/02/2013 Document Reviewed: 03/23/2013 [facility] Patient Information 2014 [place], [place].

## 2013-09-21 NOTE — Progress Notes (Signed)
   Subjective:    Patient ID: Brandi Ferguson, female    DOB: 08/22/1946, 67 y.o.   MRN: [id number]  HPI Seen 11/25 for URI treated with azithromycin 500 mg x 3 days. Saturday night started having a terrible cough, so much so that she is coughing up food. Fluids make her cough. She has copious production of dark, green, malodorous sputum. No fever, no chills, mild nausea with emesis. She has been taking phenergan with codiene with some positive effect. Not short of breath.  PMH, FamHx and SocHx reviewed for any changes and relevance.  Current Outpatient Prescriptions on File Prior to Visit  Medication Sig Dispense Refill  . ALPRAZolam (XANAX) 0.5 MG tablet Take 1 tablet (0.5 mg total) by mouth 3 (three) times daily as needed.  90 tablet  5  . aspirin 81 MG tablet Take 81 mg by mouth daily.        Marland Kitchen azithromycin (ZITHROMAX) 500 MG tablet Take 1 tablet (500 mg total) by mouth daily.  3 tablet  0  . buPROPion (WELLBUTRIN) 75 MG tablet TAKE ONE TABLET BY MOUTH TWICE DAILY  60 tablet  5  . fluticasone (FLONASE) 50 MCG/ACT nasal spray Place 1 spray into the nose daily.  16 g  5  . hydrochlorothiazide (HYDRODIURIL) 25 MG tablet TAKE 1 TABLET NIGHTLY AT BEDTIME AS DIRECTED  30 tablet  5  . hyoscyamine (ANASPAZ) 0.125 MG TBDP Place 1 tablet (0.125 mg total) under the tongue 2 (two) times daily as needed.  60 tablet  5  . levothyroxine (SYNTHROID, LEVOTHROID) 88 MCG tablet TAKE ONE TABLET BY MOUTH ONE TIME DAILY  30 tablet  5  . lovastatin (MEVACOR) 40 MG tablet TAKE ONE TABLET BY MOUTH NIGHTLY AT BEDTIME  30 tablet  5  . naproxen (NAPROSYN) 500 MG tablet Take 1 tablet (500 mg total) by mouth 2 (two) times daily as needed.  60 tablet  5  . promethazine-codeine (PHENERGAN WITH CODEINE) 6.25-10 MG/5ML syrup Take 5 mLs by mouth every 4 (four) hours as needed for cough.  120 mL  0   No current facility-administered medications on file prior to visit.      Review of Systems System review is negative  for any constitutional, cardiac, pulmonary, GI or neuro symptoms or complaints other than as described in the HPI.     Objective:   Physical Exam Filed Vitals:   09/21/13 1641  BP: 126/80  Pulse: 92  Temp: 97.3 F (36.3 C)   Wt Readings from Last 3 Encounters:  09/21/13 183 lb (83.008 kg)  09/07/13 182 lb 6.4 oz (82.736 kg)  07/27/13 184 lb 12.8 oz (83.825 kg)   Gen'l - heavyset woman in no acute HEENT- no sinus tenderness, throat is clear Neck - supple Nodes - negative Cor - RRR Pulm - CTAP       Assessment & Plan:  1. Bronchitis - no pneumonia. Not having a fever is a good sign.  Plan Augmentin 875 mg twice a day for 10 days  Continue phenergan w/ codeine  Benzonatate perles 100 mg three times a day - helps with the tickle cough  Mucinex is good - 1200 mg twice a day  Hydrate  Tylenol 1,000 mg three times a day for aches, fevers.

## 2013-09-21 NOTE — Telephone Encounter (Signed)
Phone call from patient, states she was seen 2 1/2 weeks ago given z pak and cough med with codeine. Meds finished a week ago. Now has a sore throat and coughing up green mucous and not feeling well. Has an appt tomorrow at 3:30 but would like to be seen today. Please advise.

## 2013-09-21 NOTE — Progress Notes (Signed)
Pre visit review using our clinic review tool, if applicable. No additional management support is needed unless otherwise documented below in the visit note. 

## 2013-09-22 ENCOUNTER — Telehealth: Payer: Self-pay

## 2013-09-22 ENCOUNTER — Ambulatory Visit: Payer: Medicare Other | Admitting: Internal Medicine

## 2013-09-22 NOTE — Telephone Encounter (Signed)
Is patient's Phenergan with codeine to be renewed? If so she would like it to go to [facility] on [address]

## 2013-09-23 MED ORDER — PROMETHAZINE-CODEINE 6.25-10 MG/5ML PO SYRP: 5.0000 mL | ORAL_SOLUTION | ORAL | Status: DC | PRN

## 2013-09-23 NOTE — Telephone Encounter (Signed)
Script has been called to pharmacy

## 2013-09-23 NOTE — Telephone Encounter (Signed)
Ok to renew?  

## 2013-10-04 ENCOUNTER — Telehealth: Payer: Self-pay | Admitting: *Deleted

## 2013-10-04 DIAGNOSIS — R05 Cough: Secondary | ICD-10-CM

## 2013-10-04 DIAGNOSIS — R059 Cough, unspecified: Secondary | ICD-10-CM

## 2013-10-04 NOTE — Telephone Encounter (Signed)
Pt called states she has finished the 3rd round of Antibiotics and is taking the cough medication as needed.  She states she is still having a dry cough.  Please advise.

## 2013-10-04 NOTE — Telephone Encounter (Signed)
More information please: Fever?, sputum production?, rhinorrhea - purulent snot?, facial pressure/

## 2013-10-05 ENCOUNTER — Other Ambulatory Visit: Payer: Self-pay | Admitting: Internal Medicine

## 2013-10-05 ENCOUNTER — Other Ambulatory Visit (INDEPENDENT_AMBULATORY_CARE_PROVIDER_SITE_OTHER): Payer: Medicare Other

## 2013-10-05 DIAGNOSIS — R05 Cough: Secondary | ICD-10-CM

## 2013-10-05 DIAGNOSIS — R059 Cough, unspecified: Secondary | ICD-10-CM

## 2013-10-05 LAB — CBC WITH DIFFERENTIAL/PLATELET
Basophils Relative: 1.2 % (ref 0.0–3.0)
HCT: 44 % (ref 36.0–46.0)
Hemoglobin: 15.2 g/dL — ABNORMAL HIGH (ref 12.0–15.0)
Lymphocytes Relative: 31.6 % (ref 12.0–46.0)
MCHC: 34.6 g/dL (ref 30.0–36.0)
Monocytes Absolute: 0.9 10*3/uL (ref 0.1–1.0)
Monocytes Relative: 12.8 % — ABNORMAL HIGH (ref 3.0–12.0)
Neutro Abs: 3.8 10*3/uL (ref 1.4–7.7)
Neutrophils Relative %: 53.6 % (ref 43.0–77.0)
RBC: 5.04 Mil/uL (ref 3.87–5.11)
WBC: 7.1 10*3/uL (ref 4.5–10.5)

## 2013-10-05 MED ORDER — PREDNISONE 10 MG PO TABS: 10.0000 mg | ORAL_TABLET | Freq: Every day | ORAL | Status: DC

## 2013-10-05 NOTE — Telephone Encounter (Signed)
Spoke with pt advised of MDs message 

## 2013-10-05 NOTE — Telephone Encounter (Signed)
Pt states she is having a low grade fever and dry cough with minimal sputum.

## 2013-10-05 NOTE — Telephone Encounter (Signed)
Treated with azithromycin and then augmentin for 10 days. Doubt continued bacterial infection.  Plan Come to lab for CBCD  Prednisone burst and taper for tracheal inflammation  Continue phenergan with codeine  Take prilosec otc 20 mg every AM

## 2014-03-04 ENCOUNTER — Other Ambulatory Visit: Payer: Self-pay | Admitting: *Deleted

## 2014-03-04 ENCOUNTER — Telehealth: Payer: Self-pay | Admitting: *Deleted

## 2014-03-04 MED ORDER — BUPROPION HCL 75 MG PO TABS: 75.0000 mg | ORAL_TABLET | Freq: Two times a day (BID) | ORAL | Status: DC

## 2014-03-04 MED ORDER — LOVASTATIN 40 MG PO TABS
40.0000 mg | ORAL_TABLET | Freq: Every day | ORAL | Status: DC
Start: 2014-03-04 — End: 2014-03-08

## 2014-03-04 MED ORDER — HYDROCHLOROTHIAZIDE 25 MG PO TABS: 25.0000 mg | ORAL_TABLET | Freq: Every day | ORAL | Status: DC

## 2014-03-04 NOTE — Telephone Encounter (Signed)
OK to fill this prescription with additional refills x0 Sch OV w/a new PCP. Thank you!

## 2014-03-04 NOTE — Telephone Encounter (Signed)
Rf req for Alprazolam 0.5 mg 1 po tid. # 90. Last filled 02/04/14. Ok to Rf?

## 2014-03-08 ENCOUNTER — Other Ambulatory Visit: Payer: Self-pay | Admitting: *Deleted

## 2014-03-08 ENCOUNTER — Telehealth: Payer: Self-pay

## 2014-03-08 MED ORDER — LEVOTHYROXINE SODIUM 88 MCG PO TABS: 88.0000 ug | ORAL_TABLET | Freq: Every day | ORAL | Status: DC

## 2014-03-08 MED ORDER — BUPROPION HCL 75 MG PO TABS: 75.0000 mg | ORAL_TABLET | Freq: Two times a day (BID) | ORAL | Status: DC

## 2014-03-08 MED ORDER — LOVASTATIN 40 MG PO TABS: 40.0000 mg | ORAL_TABLET | Freq: Every day | ORAL | Status: DC

## 2014-03-08 MED ORDER — HYDROCHLOROTHIAZIDE 25 MG PO TABS: 25.0000 mg | ORAL_TABLET | Freq: Every day | ORAL | Status: DC

## 2014-03-08 MED ORDER — ALPRAZOLAM 0.5 MG PO TABS: 0.5000 mg | ORAL_TABLET | Freq: Three times a day (TID) | ORAL | Status: DC | PRN

## 2014-03-08 NOTE — Telephone Encounter (Signed)
Done. Pt is scheduled to see Dr. Doug Sou in 06/2014.

## 2014-03-08 NOTE — Telephone Encounter (Signed)
Received fax request on a previous [facility] pt who is to establish 06/2014 with new PCP. Please advise on refill for alprazolam 0.5mg  TID. Thanks

## 2014-04-06 ENCOUNTER — Other Ambulatory Visit: Payer: Self-pay | Admitting: Obstetrics and Gynecology

## 2014-04-06 ENCOUNTER — Other Ambulatory Visit: Payer: Self-pay | Admitting: Internal Medicine

## 2014-04-06 NOTE — Telephone Encounter (Signed)
Former [facility] patient, seeing Dr Doug Sou 06/14/2014

## 2014-04-07 LAB — CYTOLOGY - PAP

## 2014-04-08 ENCOUNTER — Other Ambulatory Visit: Payer: Self-pay

## 2014-04-08 NOTE — Telephone Encounter (Signed)
Pt call to check up on this request, pt is going to be out of this med before the weekend is over. Please call her ASAP if this ok.

## 2014-05-04 ENCOUNTER — Other Ambulatory Visit: Payer: Self-pay | Admitting: Internal Medicine

## 2014-05-06 NOTE — Telephone Encounter (Signed)
Called pharmacy had to leave authorization on pharmacy voicemail...Brandi Ferguson

## 2014-05-09 ENCOUNTER — Other Ambulatory Visit: Payer: Self-pay

## 2014-05-09 MED ORDER — ALPRAZOLAM 0.5 MG PO TABS: ORAL_TABLET | ORAL | Status: DC

## 2014-06-03 ENCOUNTER — Other Ambulatory Visit: Payer: Self-pay | Admitting: Internal Medicine

## 2014-06-07 ENCOUNTER — Other Ambulatory Visit: Payer: Self-pay | Admitting: Internal Medicine

## 2014-06-14 ENCOUNTER — Ambulatory Visit (INDEPENDENT_AMBULATORY_CARE_PROVIDER_SITE_OTHER): Payer: Medicare Other | Admitting: Internal Medicine

## 2014-06-14 ENCOUNTER — Other Ambulatory Visit (INDEPENDENT_AMBULATORY_CARE_PROVIDER_SITE_OTHER): Payer: Medicare Other

## 2014-06-14 ENCOUNTER — Encounter: Payer: Self-pay | Admitting: Internal Medicine

## 2014-06-14 VITALS — BP 138/78 | HR 89 | Temp 98.3°F | Resp 16 | Ht 62.0 in | Wt 188.1 lb

## 2014-06-14 DIAGNOSIS — K5730 Diverticulosis of large intestine without perforation or abscess without bleeding: Secondary | ICD-10-CM

## 2014-06-14 DIAGNOSIS — E785 Hyperlipidemia, unspecified: Secondary | ICD-10-CM

## 2014-06-14 DIAGNOSIS — E039 Hypothyroidism, unspecified: Secondary | ICD-10-CM

## 2014-06-14 DIAGNOSIS — E663 Overweight: Secondary | ICD-10-CM

## 2014-06-14 DIAGNOSIS — K5732 Diverticulitis of large intestine without perforation or abscess without bleeding: Secondary | ICD-10-CM

## 2014-06-14 DIAGNOSIS — I10 Essential (primary) hypertension: Secondary | ICD-10-CM

## 2014-06-14 DIAGNOSIS — E7800 Pure hypercholesterolemia, unspecified: Secondary | ICD-10-CM

## 2014-06-14 DIAGNOSIS — K589 Irritable bowel syndrome without diarrhea: Secondary | ICD-10-CM

## 2014-06-14 DIAGNOSIS — F341 Dysthymic disorder: Secondary | ICD-10-CM

## 2014-06-14 LAB — LIPID PANEL
CHOLESTEROL: 132 mg/dL (ref 0–200)
HDL: 44.4 mg/dL (ref >39.00)
LDL CALC: 63 mg/dL (ref 0–99)
NonHDL: 87.6
Total CHOL/HDL Ratio: 3
Triglycerides: 122 mg/dL (ref 0.0–149.0)
VLDL: 24.4 mg/dL (ref 0.0–40.0)

## 2014-06-14 LAB — BASIC METABOLIC PANEL
BUN: 14 mg/dL (ref 6–23)
CALCIUM: 9.5 mg/dL (ref 8.4–10.5)
CO2: 27 mEq/L (ref 19–32)
Chloride: 106 mEq/L (ref 96–112)
Creatinine, Ser: 1 mg/dL (ref 0.4–1.2)
GFR: 62.58 mL/min (ref >60.00)
GLUCOSE: 109 mg/dL — AB (ref 70–99)
POTASSIUM: 3.8 meq/L (ref 3.5–5.1)
SODIUM: 141 meq/L (ref 135–145)

## 2014-06-14 LAB — CBC
HEMATOCRIT: 45.6 % (ref 36.0–46.0)
Hemoglobin: 15.8 g/dL — ABNORMAL HIGH (ref 12.0–15.0)
MCHC: 34.5 g/dL (ref 30.0–36.0)
MCV: 89.2 fl (ref 78.0–100.0)
Platelets: 301 10*3/uL (ref 150.0–400.0)
RBC: 5.12 Mil/uL — AB (ref 3.87–5.11)
RDW: 14.5 % (ref 11.5–15.5)
WBC: 8.3 10*3/uL (ref 4.0–10.5)

## 2014-06-14 LAB — TSH: TSH: 1.24 u[IU]/mL (ref 0.35–4.50)

## 2014-06-14 NOTE — Assessment & Plan Note (Addendum)
Will recheck lipid panel today and adjust lovastatin as needed.

## 2014-06-14 NOTE — Progress Notes (Signed)
Subjective:    Patient ID: Brandi Ferguson, female    DOB: [birth year], 68 y.o.   MRN: [id number]  HPI The patient is coming in today to transfer doctors and for routine follow up visit. She has PMH of hypothyroidism, colonic polyps, hypertension, anxiety, irritible bowel syndrome. She is feeling well currently and has no acute complaints. She is doing fairly well with her anxiety and mood and states that the wellbutrin has continued to help since she has been on the medicine. She has been stable on her current dose of her thyroid medication for several years. She is due for colonoscopy next year and plans to go back to Dr. Amedeo Plenty who did the last one. She does not take the flu shot and does not wish one today and also declines the pneumonia shot. Her bowels generally do well and her irritable bowel has been fairly quiescent of late. She denies chest pain, SOB, swelling. She is not exercising and does not walk as she does not have anyone to walk with. Her diet is not as healthy as it could be. She does not smoke and wears her seat belt while riding in vehicles. She gets mammograms yearly and is due in October. She did have a piece of nail that she picked at on her left great toe which is healing and she has been cleansing daily.   Family medical history reviewed and updated, social history reviewed and updated, past medical history reviewed and updated, medications reviewed and updated, allergies reviewed and updated.    Review of Systems  Constitutional: Negative for fever, chills, diaphoresis, activity change, appetite change, fatigue and unexpected weight change.  HENT: Negative for congestion.   Respiratory: Negative for cough, chest tightness, shortness of breath and wheezing.   Cardiovascular: Negative for chest pain, palpitations and leg swelling.  Gastrointestinal: Negative for abdominal pain, diarrhea and constipation.  Endocrine: Negative for cold intolerance and heat intolerance.    Genitourinary: Negative for dysuria, frequency, flank pain and difficulty urinating.  Musculoskeletal: Negative for arthralgias, gait problem and myalgias.  Skin: Positive for wound.       Small hangnail on left great toe with healing and no pustular pockets or pus expressible. Mild erythema but no swelling or tenderness.  Neurological: Negative for dizziness, syncope, weakness, numbness and headaches.  Psychiatric/Behavioral: Negative for suicidal ideas, confusion, sleep disturbance, dysphoric mood, decreased concentration and agitation. The patient is nervous/anxious.        Objective:   Physical Exam  Constitutional: She is oriented to person, place, and time. She appears well-developed and well-nourished. No distress.  Neck: Normal range of motion. Neck supple.  Cardiovascular: Normal rate and regular rhythm.   No murmur heard. Pulmonary/Chest: Effort normal and breath sounds normal. No respiratory distress. She has no wheezes. She has no rales. She exhibits no tenderness.  Abdominal: Soft. Bowel sounds are normal. She exhibits no distension. There is no tenderness. There is no rebound and no guarding.  Obese  Musculoskeletal: Normal range of motion. She exhibits no edema and no tenderness.  Neurological: She is alert and oriented to person, place, and time. No cranial nerve deficit.  Skin: Skin is warm and dry. She is not diaphoretic. There is erythema.  Right great toe with mild erythema, no swelling, pus, tenderness. Appears to be healing.   Psychiatric: She has a normal mood and affect. Her behavior is normal.          Assessment & Plan:   Advised to get  flu and pneumonia shot and patient declined.

## 2014-06-14 NOTE — Patient Instructions (Signed)
We will check your bloodwork and refill your medicines. Work on increasing your exercise and walking 3 times per week would be good to keep your heart healthy. Come back in 1 year unless you have problems then call our office and return sooner.   Exercise to Stay Healthy Exercise helps you become and stay healthy. EXERCISE IDEAS AND TIPS Choose exercises that:  You enjoy.  Fit into your day. You do not need to exercise really hard to be healthy. You can do exercises at a slow or medium level and stay healthy. You can:  Stretch before and after working out.  Try yoga, Pilates, or tai chi.  Lift weights.  Walk fast, swim, jog, run, climb stairs, bicycle, dance, or rollerskate.  Take aerobic classes. Exercises that burn about 150 calories:  Running 1  miles in 15 minutes.  Playing volleyball for 45 to 60 minutes.  Washing and waxing a car for 45 to 60 minutes.  Playing touch football for 45 minutes.  Walking 1  miles in 35 minutes.  Pushing a stroller 1  miles in 30 minutes.  Playing basketball for 30 minutes.  Raking leaves for 30 minutes.  Bicycling 5 miles in 30 minutes.  Walking 2 miles in 30 minutes.  Dancing for 30 minutes.  Shoveling snow for 15 minutes.  Swimming laps for 20 minutes.  Walking up stairs for 15 minutes.  Bicycling 4 miles in 15 minutes.  Gardening for 30 to 45 minutes.  Jumping rope for 15 minutes.  Washing windows or floors for 45 to 60 minutes. Document Released: 11/02/2010 Document Revised: 12/23/2011 Document Reviewed: 11/02/2010 [facility] Patient Information 2015 [place], [place]. This information is not intended to replace advice given to you by your health care provider. Make sure you discuss any questions you have with your health care provider.

## 2014-06-14 NOTE — Assessment & Plan Note (Addendum)
Filed Vitals:   06/14/14 0945  BP: 138/78  Pulse: 89  Temp: 98.3 F (36.8 C)  TempSrc: Oral  Resp: 16  Height: 5\' 2"  (1.575 m)  Weight: 188 lb 1.9 oz (85.331 kg)  SpO2: 95%   BP well controlled at today's visit and will continue HCTZ 25 mg daily. Check BMP at today's visit.

## 2014-06-14 NOTE — Assessment & Plan Note (Addendum)
No overt blood in stool and will check CBC today. She is due for repeat colonoscopy next year with Dr. Amedeo Plenty.

## 2014-06-14 NOTE — Progress Notes (Signed)
Pre visit review using our clinic review tool, if applicable. No additional management support is needed unless otherwise documented below in the visit note. 

## 2014-06-14 NOTE — Assessment & Plan Note (Addendum)
Patient is fairly well controlled at this time and continues to use xanax 0.5 mg TID and wellbutrin 75 mg daily. Will continue for now but consider taper down on xanax in the future.

## 2014-06-14 NOTE — Assessment & Plan Note (Addendum)
Patient uses hyoscyamine as needed but is fairly well controlled at this time.

## 2014-06-14 NOTE — Assessment & Plan Note (Addendum)
Check TSH and will refill synthroid 88 mcg. No symptoms of hyper or hypothyroidism.

## 2014-06-14 NOTE — Assessment & Plan Note (Addendum)
Encouraged exercise to help with weight. Will check thyroid levels at today's visit.

## 2014-06-15 MED ORDER — ALPRAZOLAM 0.5 MG PO TABS: 0.5000 mg | ORAL_TABLET | Freq: Three times a day (TID) | ORAL | Status: DC | PRN

## 2014-06-15 MED ORDER — LEVOTHYROXINE SODIUM 88 MCG PO TABS: 88.0000 ug | ORAL_TABLET | Freq: Every day | ORAL | Status: DC

## 2014-06-15 MED ORDER — HYOSCYAMINE SULFATE 0.125 MG PO TBDP: 0.1250 mg | ORAL_TABLET | Freq: Two times a day (BID) | ORAL | Status: DC | PRN

## 2014-06-15 MED ORDER — BUPROPION HCL 75 MG PO TABS: 75.0000 mg | ORAL_TABLET | Freq: Two times a day (BID) | ORAL | Status: DC

## 2014-06-15 MED ORDER — HYDROCHLOROTHIAZIDE 25 MG PO TABS: 25.0000 mg | ORAL_TABLET | Freq: Every day | ORAL | Status: DC

## 2014-06-15 MED ORDER — FLUTICASONE PROPIONATE 50 MCG/ACT NA SUSP: 1.0000 | Freq: Every day | NASAL | Status: DC

## 2014-06-15 MED ORDER — ASPIRIN 81 MG PO TABS: 81.0000 mg | ORAL_TABLET | Freq: Every day | ORAL | Status: DC

## 2014-06-15 MED ORDER — LOVASTATIN 40 MG PO TABS: 40.0000 mg | ORAL_TABLET | Freq: Every day | ORAL | Status: DC

## 2014-07-13 ENCOUNTER — Other Ambulatory Visit: Payer: Self-pay

## 2014-07-13 DIAGNOSIS — Z1231 Encounter for screening mammogram for malignant neoplasm of breast: Secondary | ICD-10-CM

## 2014-08-12 ENCOUNTER — Ambulatory Visit
Admission: RE | Admit: 2014-08-12 | Discharge: 2014-08-12 | Disposition: A | Discharge status: Home / Self Care | Payer: Medicare Other | Source: Ambulatory Visit

## 2014-08-12 DIAGNOSIS — Z1231 Encounter for screening mammogram for malignant neoplasm of breast: Secondary | ICD-10-CM

## 2015-01-02 ENCOUNTER — Other Ambulatory Visit: Payer: Self-pay | Admitting: Internal Medicine

## 2015-03-05 ENCOUNTER — Other Ambulatory Visit: Payer: Self-pay | Admitting: Internal Medicine

## 2015-03-05 ENCOUNTER — Telehealth: Payer: Self-pay | Admitting: Internal Medicine

## 2015-03-06 ENCOUNTER — Other Ambulatory Visit: Payer: Self-pay | Admitting: Geriatric Medicine

## 2015-03-06 MED ORDER — BUPROPION HCL 75 MG PO TABS: 75.0000 mg | ORAL_TABLET | Freq: Two times a day (BID) | ORAL | Status: DC

## 2015-03-06 NOTE — Telephone Encounter (Signed)
Pt called pharmacy to get refill on her buPROPion (WELLBUTRIN) 75 MG tablet [[id number]].  Can this be refilled?

## 2015-03-06 NOTE — Telephone Encounter (Signed)
Faxed to pharmacy

## 2015-03-07 ENCOUNTER — Other Ambulatory Visit: Payer: Self-pay | Admitting: Internal Medicine

## 2015-03-07 MED ORDER — BUPROPION HCL 75 MG PO TABS: 75.0000 mg | ORAL_TABLET | Freq: Two times a day (BID) | ORAL | Status: DC

## 2015-03-07 NOTE — Telephone Encounter (Signed)
Refill sent to [facility].../[facility]

## 2015-03-10 ENCOUNTER — Other Ambulatory Visit: Payer: Self-pay | Admitting: Geriatric Medicine

## 2015-03-10 MED ORDER — LOVASTATIN 40 MG PO TABS: 40.0000 mg | ORAL_TABLET | Freq: Every day | ORAL | Status: DC

## 2015-04-10 ENCOUNTER — Other Ambulatory Visit: Payer: Self-pay

## 2015-04-12 LAB — CYTOLOGY - PAP

## 2015-05-16 LAB — HM COLONOSCOPY

## 2015-05-22 ENCOUNTER — Encounter: Payer: Self-pay | Admitting: Internal Medicine

## 2015-06-21 ENCOUNTER — Encounter: Payer: Self-pay | Admitting: Internal Medicine

## 2015-06-21 ENCOUNTER — Ambulatory Visit (INDEPENDENT_AMBULATORY_CARE_PROVIDER_SITE_OTHER): Payer: Medicare Other | Admitting: Internal Medicine

## 2015-06-21 ENCOUNTER — Other Ambulatory Visit (INDEPENDENT_AMBULATORY_CARE_PROVIDER_SITE_OTHER): Payer: Medicare Other

## 2015-06-21 VITALS — BP 138/70 | HR 86 | Temp 98.5°F | Resp 18 | Ht 62.0 in | Wt 191.0 lb

## 2015-06-21 DIAGNOSIS — Z0000 Encounter for general adult medical examination without abnormal findings: Secondary | ICD-10-CM | POA: Diagnosis not present

## 2015-06-21 DIAGNOSIS — E039 Hypothyroidism, unspecified: Secondary | ICD-10-CM

## 2015-06-21 DIAGNOSIS — E785 Hyperlipidemia, unspecified: Secondary | ICD-10-CM

## 2015-06-21 DIAGNOSIS — F341 Dysthymic disorder: Secondary | ICD-10-CM

## 2015-06-21 DIAGNOSIS — I10 Essential (primary) hypertension: Secondary | ICD-10-CM

## 2015-06-21 DIAGNOSIS — E669 Obesity, unspecified: Secondary | ICD-10-CM

## 2015-06-21 LAB — LIPID PANEL
CHOLESTEROL: 133 mg/dL (ref 0–200)
HDL: 47.3 mg/dL (ref >39.00)
LDL CALC: 67 mg/dL (ref 0–99)
NonHDL: 86.02
TRIGLYCERIDES: 93 mg/dL (ref 0.0–149.0)
Total CHOL/HDL Ratio: 3
VLDL: 18.6 mg/dL (ref 0.0–40.0)

## 2015-06-21 LAB — COMPREHENSIVE METABOLIC PANEL
ALBUMIN: 4 g/dL (ref 3.5–5.2)
ALK PHOS: 70 U/L (ref 39–117)
ALT: 21 U/L (ref 0–35)
AST: 24 U/L (ref 0–37)
BUN: 16 mg/dL (ref 6–23)
CALCIUM: 10 mg/dL (ref 8.4–10.5)
CHLORIDE: 105 meq/L (ref 96–112)
CO2: 27 mEq/L (ref 19–32)
Creatinine, Ser: 0.89 mg/dL (ref 0.40–1.20)
GFR: 67.27 mL/min (ref >60.00)
Glucose, Bld: 102 mg/dL — ABNORMAL HIGH (ref 70–99)
POTASSIUM: 3.6 meq/L (ref 3.5–5.1)
SODIUM: 141 meq/L (ref 135–145)
TOTAL PROTEIN: 7.1 g/dL (ref 6.0–8.3)
Total Bilirubin: 0.6 mg/dL (ref 0.2–1.2)

## 2015-06-21 LAB — TSH: TSH: 0.56 u[IU]/mL (ref 0.35–4.50)

## 2015-06-21 MED ORDER — HYDROCHLOROTHIAZIDE 25 MG PO TABS: 25.0000 mg | ORAL_TABLET | Freq: Every day | ORAL | Status: DC

## 2015-06-21 MED ORDER — BUPROPION HCL 75 MG PO TABS: 75.0000 mg | ORAL_TABLET | Freq: Two times a day (BID) | ORAL | Status: DC

## 2015-06-21 MED ORDER — HYOSCYAMINE SULFATE 0.125 MG PO TBDP: 0.1250 mg | ORAL_TABLET | Freq: Two times a day (BID) | ORAL | Status: DC | PRN

## 2015-06-21 MED ORDER — LOVASTATIN 40 MG PO TABS: 40.0000 mg | ORAL_TABLET | Freq: Every day | ORAL | Status: DC

## 2015-06-21 MED ORDER — LEVOTHYROXINE SODIUM 88 MCG PO TABS: 88.0000 ug | ORAL_TABLET | Freq: Every day | ORAL | Status: DC

## 2015-06-21 MED ORDER — ALPRAZOLAM 0.5 MG PO TABS: 0.5000 mg | ORAL_TABLET | Freq: Three times a day (TID) | ORAL | Status: DC | PRN

## 2015-06-21 NOTE — Assessment & Plan Note (Signed)
Checking CMP today, controlled on HCTZ. No side effects, adjust as needed. BP at goal.

## 2015-06-21 NOTE — Assessment & Plan Note (Signed)
Colonoscopy recently done in August, due for follow up in 5 years. Declines immunizations at today's visit. Checking labs and adjust therapy as needed. 10 year screening recommendations given to her at visit. Mammogram up to date and aged out of pap smear screening. Talked with her about the need to start exercising and the amount needed for weight loss versus health. She will work on starting to use her stationary bike at home.

## 2015-06-21 NOTE — Assessment & Plan Note (Signed)
Checking lipid panel today, currently on lovastatin 40 mg daily and goal <130 LDL. Adjust as needed. No side effects.

## 2015-06-21 NOTE — Assessment & Plan Note (Signed)
Weight up this year and talked to her about the need to work on exercise in her life. She knows and will work on using the stationary bike they have at home.

## 2015-06-21 NOTE — Assessment & Plan Note (Signed)
In remission with her wellbutrin and she does not wish to try coming off medication due to severity of the episode previously. Uses xanax prn.

## 2015-06-21 NOTE — Patient Instructions (Signed)
We will check the blood work today and call you back with the results.   We have sent in the refills of the medications today.  Health Maintenance Adopting a healthy lifestyle and getting preventive care can go a long way to promote health and wellness. Talk with your health care provider about what schedule of regular examinations is right for you. This is a good chance for you to check in with your provider about disease prevention and staying healthy. In between checkups, there are plenty of things you can do on your own. Experts have done a lot of research about which lifestyle changes and preventive measures are most likely to keep you healthy. Ask your health care provider for more information. WEIGHT AND DIET  Eat a healthy diet  Be sure to include plenty of vegetables, fruits, low-fat dairy products, and lean protein.  Do not eat a lot of foods high in solid fats, added sugars, or salt.  Get regular exercise. This is one of the most important things you can do for your health.  Most adults should exercise for at least 150 minutes each week. The exercise should increase your heart rate and make you sweat (moderate-intensity exercise).  Most adults should also do strengthening exercises at least twice a week. This is in addition to the moderate-intensity exercise.  Maintain a healthy weight  Body mass index (BMI) is a measurement that can be used to identify possible weight problems. It estimates body fat based on height and weight. Your health care provider can help determine your BMI and help you achieve or maintain a healthy weight.  For females 70 years of age and older:   A BMI below 18.5 is considered underweight.  A BMI of 18.5 to 24.9 is normal.  A BMI of 25 to 29.9 is considered overweight.  A BMI of 30 and above is considered obese.  Watch levels of cholesterol and blood lipids  You should start having your blood tested for lipids and cholesterol at 67 years of  age, then have this test every 5 years.  You may need to have your cholesterol levels checked more often if:  Your lipid or cholesterol levels are high.  You are older than 67 years of age.  You are at high risk for heart disease.  CANCER SCREENING   Lung Cancer  Lung cancer screening is recommended for adults 69-49 years old who are at high risk for lung cancer because of a history of smoking.  A yearly low-dose CT scan of the lungs is recommended for people who:  Currently smoke.  Have quit within the past 15 years.  Have at least a 30-pack-year history of smoking. A pack year is smoking an average of one pack of cigarettes a day for 1 year.  Yearly screening should continue until it has been 15 years since you quit.  Yearly screening should stop if you develop a health problem that would prevent you from having lung cancer treatment.  Breast Cancer  Practice breast self-awareness. This means understanding how your breasts normally appear and feel.  It also means doing regular breast self-exams. Let your health care provider know about any changes, no matter how small.  If you are in your 20s or 30s, you should have a clinical breast exam (CBE) by a health care provider every 1-3 years as part of a regular health exam.  If you are 55 or older, have a CBE every year. Also consider having a breast  X-ray (mammogram) every year.  If you have a family history of breast cancer, talk to your health care provider about genetic screening.  If you are at high risk for breast cancer, talk to your health care provider about having an MRI and a mammogram every year.  Breast cancer gene (BRCA) assessment is recommended for women who have family members with BRCA-related cancers. BRCA-related cancers include:  Breast.  Ovarian.  Tubal.  Peritoneal cancers.  Results of the assessment will determine the need for genetic counseling and BRCA1 and BRCA2 testing. Cervical  Cancer Routine pelvic examinations to screen for cervical cancer are no longer recommended for nonpregnant women who are considered low risk for cancer of the pelvic organs (ovaries, uterus, and vagina) and who do not have symptoms. A pelvic examination may be necessary if you have symptoms including those associated with pelvic infections. Ask your health care provider if a screening pelvic exam is right for you.   The Pap test is the screening test for cervical cancer for women who are considered at risk.  If you had a hysterectomy for a problem that was not cancer or a condition that could lead to cancer, then you no longer need Pap tests.  If you are older than 65 years, and you have had normal Pap tests for the past 10 years, you no longer need to have Pap tests.  If you have had past treatment for cervical cancer or a condition that could lead to cancer, you need Pap tests and screening for cancer for at least 20 years after your treatment.  If you no longer get a Pap test, assess your risk factors if they change (such as having a new sexual partner). This can affect whether you should start being screened again.  Some women have medical problems that increase their chance of getting cervical cancer. If this is the case for you, your health care provider may recommend more frequent screening and Pap tests.  The human papillomavirus (HPV) test is another test that may be used for cervical cancer screening. The HPV test looks for the virus that can cause cell changes in the cervix. The cells collected during the Pap test can be tested for HPV.  The HPV test can be used to screen women 93 years of age and older. Getting tested for HPV can extend the interval between normal Pap tests from three to five years.  An HPV test also should be used to screen women of any age who have unclear Pap test results.  After 67 years of age, women should have HPV testing as often as Pap tests.  Colorectal  Cancer  This type of cancer can be detected and often prevented.  Routine colorectal cancer screening usually begins at 66 years of age and continues through 67 years of age.  Your health care provider may recommend screening at an earlier age if you have risk factors for colon cancer.  Your health care provider may also recommend using home test kits to check for hidden blood in the stool.  A small camera at the end of a tube can be used to examine your colon directly (sigmoidoscopy or colonoscopy). This is done to check for the earliest forms of colorectal cancer.  Routine screening usually begins at age 84.  Direct examination of the colon should be repeated every 5-10 years through 67 years of age. However, you may need to be screened more often if early forms of precancerous polyps or small  growths are found. Skin Cancer  Check your skin from head to toe regularly.  Tell your health care provider about any new moles or changes in moles, especially if there is a change in a mole's shape or color.  Also tell your health care provider if you have a mole that is larger than the size of a pencil eraser.  Always use sunscreen. Apply sunscreen liberally and repeatedly throughout the day.  Protect yourself by wearing long sleeves, pants, a wide-brimmed hat, and sunglasses whenever you are outside. HEART DISEASE, DIABETES, AND HIGH BLOOD PRESSURE   Have your blood pressure checked at least every 1-2 years. High blood pressure causes heart disease and increases the risk of stroke.  If you are between 74 years and 8 years old, ask your health care provider if you should take aspirin to prevent strokes.  Have regular diabetes screenings. This involves taking a blood sample to check your fasting blood sugar level.  If you are at a normal weight and have a low risk for diabetes, have this test once every three years after 67 years of age.  If you are overweight and have a high risk for  diabetes, consider being tested at a younger age or more often. PREVENTING INFECTION  Hepatitis B  If you have a higher risk for hepatitis B, you should be screened for this virus. You are considered at high risk for hepatitis B if:  You were born in a country where hepatitis B is common. Ask your health care provider which countries are considered high risk.  Your parents were born in a high-risk country, and you have not been immunized against hepatitis B (hepatitis B vaccine).  You have HIV or AIDS.  You use needles to inject street drugs.  You live with someone who has hepatitis B.  You have had sex with someone who has hepatitis B.  You get hemodialysis treatment.  You take certain medicines for conditions, including cancer, organ transplantation, and autoimmune conditions. Hepatitis C  Blood testing is recommended for:  Everyone born from [birth year] through 1963.  Anyone with known risk factors for hepatitis C. Sexually transmitted infections (STIs)  You should be screened for sexually transmitted infections (STIs) including gonorrhea and chlamydia if:  You are sexually active and are younger than 67 years of age.  You are older than 67 years of age and your health care provider tells you that you are at risk for this type of infection.  Your sexual activity has changed since you were last screened and you are at an increased risk for chlamydia or gonorrhea. Ask your health care provider if you are at risk.  If you do not have HIV, but are at risk, it may be recommended that you take a prescription medicine daily to prevent HIV infection. This is called pre-exposure prophylaxis (PrEP). You are considered at risk if:  You are sexually active and do not regularly use condoms or know the HIV status of your partner(s).  You take drugs by injection.  You are sexually active with a partner who has HIV. Talk with your health care provider about whether you are at high risk of  being infected with HIV. If you choose to begin PrEP, you should first be tested for HIV. You should then be tested every 3 months for as long as you are taking PrEP.  PREGNANCY   If you are premenopausal and you may become pregnant, ask your health care provider about preconception counseling.  If you may become pregnant, take 400 to 800 micrograms (mcg) of folic acid every day.  If you want to prevent pregnancy, talk to your health care provider about birth control (contraception). OSTEOPOROSIS AND MENOPAUSE   Osteoporosis is a disease in which the bones lose minerals and strength with aging. This can result in serious bone fractures. Your risk for osteoporosis can be identified using a bone density scan.  If you are 65 years of age or older, or if you are at risk for osteoporosis and fractures, ask your health care provider if you should be screened.  Ask your health care provider whether you should take a calcium or vitamin D supplement to lower your risk for osteoporosis.  Menopause may have certain physical symptoms and risks.  Hormone replacement therapy may reduce some of these symptoms and risks. Talk to your health care provider about whether hormone replacement therapy is right for you.  HOME CARE INSTRUCTIONS   Schedule regular health, dental, and eye exams.  Stay current with your immunizations.   Do not use any tobacco products including cigarettes, chewing tobacco, or electronic cigarettes.  If you are pregnant, do not drink alcohol.  If you are breastfeeding, limit how much and how often you drink alcohol.  Limit alcohol intake to no more than 1 drink per day for nonpregnant women. One drink equals 12 ounces of beer, 5 ounces of wine, or 1 ounces of hard liquor.  Do not use street drugs.  Do not share needles.  Ask your health care provider for help if you need support or information about quitting drugs.  Tell your health care provider if you often feel  depressed.  Tell your health care provider if you have ever been abused or do not feel safe at home. Document Released: 04/15/2011 Document Revised: 02/14/2014 Document Reviewed: 09/01/2013 [employer] Patient Information 2015 [employer]. This information is not intended to replace advice given to you by your health care provider. Make sure you discuss any questions you have with your health care provider.  

## 2015-06-21 NOTE — Progress Notes (Signed)
   Subjective:    Patient ID: Brandi Ferguson, female    DOB: 1946-09-01, 69 y.o.   MRN: [id number]  HPI Here for medicare wellness, no new complaints. Please see A/P for status and treatment of chronic medical problems. Denies side effects of medication. Not exercising like she should be. Weight is up from last year.   Diet: average Physical activity: sedentary Depression/mood screen: negative Hearing: intact to whispered voice, some mild loss Visual acuity: grossly normal, performs annual eye exam  ADLs: capable Fall risk: none Home safety: good Cognitive evaluation: intact to orientation, naming, recall and repetition EOL planning: adv directives discussed and not in place, she has not talked to family about her wishes  I have personally reviewed and have noted 1. The patient's medical and social history - reviewed today no changes 2. Their use of alcohol, tobacco or illicit drugs 3. Their current medications and supplements 4. The patient's functional ability including ADL's, fall risks, home safety risks and hearing or visual impairment. 5. Diet and physical activities 6. Evidence for depression or mood disorders 7. Care team reviewed and updated (available in snapshot)  Review of Systems  Constitutional: Negative for fever, chills, diaphoresis, activity change, appetite change, fatigue and unexpected weight change.  HENT: Negative for congestion.   Respiratory: Negative for cough, chest tightness, shortness of breath and wheezing.   Cardiovascular: Negative for chest pain, palpitations and leg swelling.  Gastrointestinal: Negative for abdominal pain, diarrhea and constipation.  Endocrine: Negative for cold intolerance and heat intolerance.  Genitourinary: Negative for dysuria, frequency, flank pain and difficulty urinating.  Musculoskeletal: Negative for myalgias, arthralgias and gait problem.  Skin: Negative.   Neurological: Negative for dizziness, syncope, weakness,  numbness and headaches.  Psychiatric/Behavioral: Negative for suicidal ideas, confusion, sleep disturbance, dysphoric mood, decreased concentration and agitation. The patient is not nervous/anxious.       Objective:   Physical Exam  Constitutional: She is oriented to person, place, and time. She appears well-developed and well-nourished.  HENT:  Head: Normocephalic and atraumatic.  Eyes: EOM are normal.  Neck: Normal range of motion. Neck supple.  Cardiovascular: Normal rate and regular rhythm.   No murmur heard. Pulmonary/Chest: Effort normal and breath sounds normal. No respiratory distress. She has no wheezes. She has no rales. She exhibits no tenderness.  Abdominal: Soft. Bowel sounds are normal. She exhibits no distension. There is no tenderness. There is no rebound and no guarding.  Obese  Musculoskeletal: Normal range of motion. She exhibits no edema or tenderness.  Neurological: She is alert and oriented to person, place, and time. No cranial nerve deficit.  Skin: Skin is warm and dry.  Psychiatric: She has a normal mood and affect. Her behavior is normal.   Filed Vitals:   06/21/15 0919  BP: 138/70  Pulse: 86  Temp: 98.5 F (36.9 C)  TempSrc: Oral  Resp: 18  Height: 5\' 2"  (1.575 m)  Weight: 191 lb (86.637 kg)  SpO2: 97%      Assessment & Plan:

## 2015-06-21 NOTE — Assessment & Plan Note (Signed)
No new symptoms of over or under replacement. Taking synthroid 88 mcg daily and check TSH today. Adjust as needed.

## 2015-06-21 NOTE — Progress Notes (Signed)
Pre visit review using our clinic review tool, if applicable. No additional management support is needed unless otherwise documented below in the visit note. 

## 2015-07-18 ENCOUNTER — Other Ambulatory Visit: Payer: Self-pay

## 2015-07-18 DIAGNOSIS — Z1231 Encounter for screening mammogram for malignant neoplasm of breast: Secondary | ICD-10-CM

## 2015-08-15 ENCOUNTER — Ambulatory Visit
Admission: RE | Admit: 2015-08-15 | Discharge: 2015-08-15 | Disposition: A | Discharge status: Home / Self Care | Payer: Medicare Other | Source: Ambulatory Visit

## 2015-08-15 DIAGNOSIS — Z1231 Encounter for screening mammogram for malignant neoplasm of breast: Secondary | ICD-10-CM

## 2015-08-17 ENCOUNTER — Other Ambulatory Visit: Payer: Self-pay | Admitting: Internal Medicine

## 2015-08-17 DIAGNOSIS — R928 Other abnormal and inconclusive findings on diagnostic imaging of breast: Secondary | ICD-10-CM

## 2015-08-25 ENCOUNTER — Ambulatory Visit
Admission: RE | Admit: 2015-08-25 | Discharge: 2015-08-25 | Disposition: A | Discharge status: Home / Self Care | Payer: Medicare Other | Source: Ambulatory Visit | Attending: Internal Medicine | Admitting: Internal Medicine

## 2015-08-25 DIAGNOSIS — R928 Other abnormal and inconclusive findings on diagnostic imaging of breast: Secondary | ICD-10-CM

## 2015-08-29 ENCOUNTER — Other Ambulatory Visit: Payer: Self-pay | Admitting: Internal Medicine

## 2015-08-29 NOTE — Telephone Encounter (Signed)
Ok to refill 

## 2015-08-30 ENCOUNTER — Other Ambulatory Visit: Payer: Self-pay | Admitting: Internal Medicine

## 2015-09-04 NOTE — Telephone Encounter (Signed)
rx faxed to pharmacy

## 2015-10-19 ENCOUNTER — Ambulatory Visit (INDEPENDENT_AMBULATORY_CARE_PROVIDER_SITE_OTHER): Payer: Medicare Other | Admitting: Internal Medicine

## 2015-10-19 ENCOUNTER — Encounter: Payer: Self-pay | Admitting: Internal Medicine

## 2015-10-19 VITALS — BP 138/78 | HR 86 | Temp 98.3°F | Resp 20 | Ht 62.0 in | Wt 192.0 lb

## 2015-10-19 DIAGNOSIS — J209 Acute bronchitis, unspecified: Secondary | ICD-10-CM

## 2015-10-19 NOTE — Patient Instructions (Signed)
We do not think that you need another chest x-ray.

## 2015-10-19 NOTE — Assessment & Plan Note (Signed)
Doing better off the antibiotics. Does not need further treatment and will not order repeat chest x-ray as she had no findings of pneumonia.

## 2015-10-19 NOTE — Progress Notes (Signed)
   Subjective:    Patient ID: Brandi Ferguson, female    DOB: Jan 03, 1946, 70 y.o.   MRN: [id number]  HPI The patient is a 70 YO female coming in for urgent care follow up. She was having a cold/bronchitis. She was seen and got chest x-ray which showed some atelectasis in the left base. She was treated with azithromycin and is feeling about 95% better. Still some mild sinus drainage which she has year round. She denies cough or SOB. No fevers or chills.   Review of Systems  Constitutional: Negative for fever, chills, diaphoresis, activity change, appetite change, fatigue and unexpected weight change.  HENT: Positive for congestion and postnasal drip. Negative for rhinorrhea, sinus pressure, sore throat and trouble swallowing.   Eyes: Negative.   Respiratory: Negative for cough, chest tightness, shortness of breath and wheezing.   Cardiovascular: Negative for chest pain, palpitations and leg swelling.  Gastrointestinal: Negative for abdominal pain, diarrhea and constipation.  Musculoskeletal: Negative for myalgias, arthralgias and gait problem.  Neurological: Negative for dizziness, syncope, weakness, numbness and headaches.      Objective:   Physical Exam  Constitutional: She is oriented to person, place, and time. She appears well-developed and well-nourished.  HENT:  Head: Normocephalic and atraumatic.  Right Ear: External ear normal.  Left Ear: External ear normal.  Nose: Nose normal.  Mouth/Throat: Oropharynx is clear and moist.  Eyes: EOM are normal.  Neck: Normal range of motion. Neck supple.  Cardiovascular: Normal rate and regular rhythm.   No murmur heard. Pulmonary/Chest: Effort normal and breath sounds normal. No respiratory distress. She has no wheezes. She has no rales. She exhibits no tenderness.  Abdominal: Soft. Bowel sounds are normal. She exhibits no distension. There is no tenderness. There is no rebound and no guarding.  Obese  Musculoskeletal: Normal range of  motion. She exhibits no edema or tenderness.  Neurological: She is alert and oriented to person, place, and time. No cranial nerve deficit.  Skin: Skin is warm and dry.  Psychiatric: She has a normal mood and affect. Her behavior is normal.   Filed Vitals:   10/19/15 1037  BP: 138/78  Pulse: 86  Temp: 98.3 F (36.8 C)  TempSrc: Oral  Resp: 20  Height: 5\' 2"  (1.575 m)  Weight: 192 lb (87.091 kg)  SpO2: 98%      Assessment & Plan:

## 2015-10-19 NOTE — Progress Notes (Signed)
Pre visit review using our clinic review tool, if applicable. No additional management support is needed unless otherwise documented below in the visit note. 

## 2015-11-28 ENCOUNTER — Other Ambulatory Visit: Payer: Self-pay | Admitting: Internal Medicine

## 2015-11-28 NOTE — Telephone Encounter (Signed)
Sent to pharmacy 

## 2015-12-28 DIAGNOSIS — L821 Other seborrheic keratosis: Secondary | ICD-10-CM | POA: Diagnosis not present

## 2015-12-28 DIAGNOSIS — Z08 Encounter for follow-up examination after completed treatment for malignant neoplasm: Secondary | ICD-10-CM | POA: Diagnosis not present

## 2015-12-28 DIAGNOSIS — Z1283 Encounter for screening for malignant neoplasm of skin: Secondary | ICD-10-CM | POA: Diagnosis not present

## 2015-12-28 DIAGNOSIS — Z809 Family history of malignant neoplasm, unspecified: Secondary | ICD-10-CM | POA: Diagnosis not present

## 2016-02-22 ENCOUNTER — Telehealth: Payer: Self-pay | Admitting: Internal Medicine

## 2016-02-27 ENCOUNTER — Other Ambulatory Visit: Payer: Self-pay | Admitting: Internal Medicine

## 2016-02-27 MED ORDER — ALPRAZOLAM 0.5 MG PO TABS
ORAL_TABLET | ORAL | Status: DC
Start: 2016-02-27 — End: 2016-03-26

## 2016-02-27 NOTE — Telephone Encounter (Signed)
Medication refilled and will be faxed.  

## 2016-02-27 NOTE — Telephone Encounter (Signed)
Pt called again about this refill on this med.  She is really concerned about not getting refill today.  She is worried if she come off this med she will have side effects.

## 2016-02-27 NOTE — Addendum Note (Signed)
Addended by: Mauricio Po D on: 02/27/2016 04:33 PM   Modules accepted: Orders

## 2016-02-27 NOTE — Telephone Encounter (Signed)
Faxed to pharmacy. Patient aware.

## 2016-02-27 NOTE — Telephone Encounter (Signed)
Patient is calling to get an update on RX requested. Advised no update at this point. Please call the patient to give update.

## 2016-03-26 ENCOUNTER — Other Ambulatory Visit: Payer: Self-pay | Admitting: Internal Medicine

## 2016-03-26 NOTE — Telephone Encounter (Signed)
Faxed to pharmacy

## 2016-03-26 NOTE — Telephone Encounter (Signed)
Filled and needs to schedule yearly physical in September

## 2016-04-25 ENCOUNTER — Other Ambulatory Visit: Payer: Self-pay | Admitting: Obstetrics

## 2016-04-25 DIAGNOSIS — Z124 Encounter for screening for malignant neoplasm of cervix: Secondary | ICD-10-CM | POA: Diagnosis not present

## 2016-04-25 DIAGNOSIS — Z01419 Encounter for gynecological examination (general) (routine) without abnormal findings: Secondary | ICD-10-CM | POA: Diagnosis not present

## 2016-04-29 LAB — CYTOLOGY - PAP

## 2016-05-10 ENCOUNTER — Other Ambulatory Visit: Payer: Self-pay | Admitting: Obstetrics

## 2016-05-10 DIAGNOSIS — R87620 Atypical squamous cells of undetermined significance on cytologic smear of vagina (ASC-US): Secondary | ICD-10-CM | POA: Diagnosis not present

## 2016-05-10 DIAGNOSIS — N761 Subacute and chronic vaginitis: Secondary | ICD-10-CM | POA: Diagnosis not present

## 2016-06-22 ENCOUNTER — Other Ambulatory Visit: Payer: Self-pay | Admitting: Internal Medicine

## 2016-06-25 ENCOUNTER — Encounter: Payer: Self-pay | Admitting: Internal Medicine

## 2016-06-25 ENCOUNTER — Other Ambulatory Visit (INDEPENDENT_AMBULATORY_CARE_PROVIDER_SITE_OTHER): Payer: Medicare Other

## 2016-06-25 ENCOUNTER — Ambulatory Visit (INDEPENDENT_AMBULATORY_CARE_PROVIDER_SITE_OTHER): Payer: Medicare Other | Admitting: Internal Medicine

## 2016-06-25 VITALS — BP 128/80 | HR 74 | Temp 97.8°F | Resp 12 | Ht 62.0 in | Wt 186.8 lb

## 2016-06-25 DIAGNOSIS — E785 Hyperlipidemia, unspecified: Secondary | ICD-10-CM

## 2016-06-25 DIAGNOSIS — Z0000 Encounter for general adult medical examination without abnormal findings: Secondary | ICD-10-CM

## 2016-06-25 DIAGNOSIS — E669 Obesity, unspecified: Secondary | ICD-10-CM

## 2016-06-25 DIAGNOSIS — I10 Essential (primary) hypertension: Secondary | ICD-10-CM | POA: Diagnosis not present

## 2016-06-25 DIAGNOSIS — F341 Dysthymic disorder: Secondary | ICD-10-CM

## 2016-06-25 DIAGNOSIS — E039 Hypothyroidism, unspecified: Secondary | ICD-10-CM

## 2016-06-25 LAB — COMPREHENSIVE METABOLIC PANEL
ALT: 18 U/L (ref 0–35)
AST: 22 U/L (ref 0–37)
Albumin: 4 g/dL (ref 3.5–5.2)
Alkaline Phosphatase: 58 U/L (ref 39–117)
BUN: 14 mg/dL (ref 6–23)
CALCIUM: 9.4 mg/dL (ref 8.4–10.5)
CHLORIDE: 105 meq/L (ref 96–112)
CO2: 30 meq/L (ref 19–32)
Creatinine, Ser: 0.94 mg/dL (ref 0.40–1.20)
GFR: 62.96 mL/min (ref >60.00)
GLUCOSE: 94 mg/dL (ref 70–99)
POTASSIUM: 3.8 meq/L (ref 3.5–5.1)
Sodium: 142 mEq/L (ref 135–145)
Total Bilirubin: 0.6 mg/dL (ref 0.2–1.2)
Total Protein: 7.2 g/dL (ref 6.0–8.3)

## 2016-06-25 LAB — LIPID PANEL
CHOL/HDL RATIO: 3
Cholesterol: 138 mg/dL (ref 0–200)
HDL: 46.6 mg/dL (ref >39.00)
LDL Cholesterol: 73 mg/dL (ref 0–99)
NONHDL: 91.2
TRIGLYCERIDES: 90 mg/dL (ref 0.0–149.0)
VLDL: 18 mg/dL (ref 0.0–40.0)

## 2016-06-25 LAB — TSH: TSH: 1.03 u[IU]/mL (ref 0.35–4.50)

## 2016-06-25 MED ORDER — BUPROPION HCL 75 MG PO TABS: 75.0000 mg | ORAL_TABLET | Freq: Two times a day (BID) | ORAL | 5 refills | Status: DC

## 2016-06-25 MED ORDER — LEVOTHYROXINE SODIUM 88 MCG PO TABS: 88.0000 ug | ORAL_TABLET | Freq: Every day | ORAL | 3 refills | Status: DC

## 2016-06-25 MED ORDER — ALPRAZOLAM 0.5 MG PO TABS: 0.5000 mg | ORAL_TABLET | Freq: Three times a day (TID) | ORAL | 5 refills | Status: DC | PRN

## 2016-06-25 MED ORDER — HYDROCHLOROTHIAZIDE 25 MG PO TABS: 25.0000 mg | ORAL_TABLET | Freq: Every day | ORAL | 3 refills | Status: DC

## 2016-06-25 MED ORDER — HYOSCYAMINE SULFATE 0.125 MG PO TBDP: 0.1250 mg | ORAL_TABLET | Freq: Two times a day (BID) | ORAL | 5 refills | Status: DC | PRN

## 2016-06-25 NOTE — Progress Notes (Signed)
   Subjective:    Patient ID: Brandi Ferguson, female    DOB: 05/07/1946, 70 y.o.   MRN: [id number]  HPI Here for medicare wellness and CPE, no new complaints. Please see A/P for status and treatment of chronic medical problems.   Diet: heart healthy Physical activity: sedentary, walks 2-3 times per week Depression/mood screen: negative, chronic and stable control Hearing: intact to whispered voice, mild loss bilaterally Visual acuity: grossly normal, performs annual eye exam  ADLs: capable Fall risk: none Home safety: good Cognitive evaluation: intact to orientation, naming, recall and repetition EOL planning: adv directives discussed  I have personally reviewed and have noted 1. The patient's medical and social history - reviewed today no changes 2. Their use of alcohol, tobacco or illicit drugs 3. Their current medications and supplements 4. The patient's functional ability including ADL's, fall risks, home safety risks and hearing or visual impairment. 5. Diet and physical activities 6. Evidence for depression or mood disorders 7. Care team reviewed and updated (available in snapshot)  Review of Systems  Constitutional: Negative for activity change, appetite change, chills, diaphoresis, fatigue, fever and unexpected weight change.  HENT: Negative for congestion.   Eyes: Negative.   Respiratory: Negative for cough, chest tightness, shortness of breath and wheezing.   Cardiovascular: Negative for chest pain, palpitations and leg swelling.  Gastrointestinal: Negative for abdominal pain, constipation and diarrhea.  Endocrine: Negative for cold intolerance and heat intolerance.  Genitourinary: Negative for difficulty urinating, dysuria, flank pain and frequency.  Musculoskeletal: Negative for arthralgias, gait problem and myalgias.  Skin: Negative.   Neurological: Negative for dizziness, syncope, weakness, numbness and headaches.  Psychiatric/Behavioral: Negative for  agitation, confusion, decreased concentration, dysphoric mood, sleep disturbance and suicidal ideas. The patient is not nervous/anxious.       Objective:   Physical Exam  Constitutional: She is oriented to person, place, and time. She appears well-developed and well-nourished.  HENT:  Head: Normocephalic and atraumatic.  Eyes: EOM are normal.  Neck: Normal range of motion. Neck supple.  Cardiovascular: Normal rate and regular rhythm.   Carotids without bruit  Pulmonary/Chest: Effort normal and breath sounds normal. No respiratory distress. She has no wheezes. She has no rales. She exhibits no tenderness.  Abdominal: Soft. Bowel sounds are normal. She exhibits no distension. There is no tenderness. There is no rebound and no guarding.  Obese  Musculoskeletal: Normal range of motion. She exhibits no edema or tenderness.  Neurological: She is alert and oriented to person, place, and time. No cranial nerve deficit.  Skin: Skin is warm and dry.  Psychiatric: She has a normal mood and affect.   Vitals:   06/25/16 0858  BP: 128/80  Pulse: 74  Resp: 12  Temp: 97.8 F (36.6 C)  TempSrc: Oral  SpO2: 97%  Weight: 186 lb 12.8 oz (84.7 kg)  Height: 5\' 2"  (1.575 m)      Assessment & Plan:

## 2016-06-25 NOTE — Assessment & Plan Note (Signed)
Refilled wellbutrin and xanax. She takes xanax TID and reminded her about the risks and potential harms from taking xanax and she feels that this is a QOL issue and wishes to continue.

## 2016-06-25 NOTE — Assessment & Plan Note (Signed)
Weight stable today. Reminded about the need for exercise consistently 3 days per week.

## 2016-06-25 NOTE — Progress Notes (Signed)
Pre visit review using our clinic review tool, if applicable. No additional management support is needed unless otherwise documented below in the visit note. 

## 2016-06-25 NOTE — Assessment & Plan Note (Signed)
Declines all immunizations today, declines hep c screening. Colonoscopy and mammogram up to date. Checking labs and adjust as needed. Counseled about sun safety and mole surveillance. Given 10 year screening recommendations.

## 2016-06-25 NOTE — Assessment & Plan Note (Signed)
Taking synthroid 88 mcg daily, checking TSH and adjust as needed.

## 2016-06-25 NOTE — Assessment & Plan Note (Signed)
BP at goal on hctz and checking CMP and adjust as needed.  

## 2016-06-25 NOTE — Patient Instructions (Signed)
We have sent in the refills today and will check the blood work and send the results on mychart.   Keep up the good work with exercising 3 times per week to keep the health good.   Health Maintenance, Female Adopting a healthy lifestyle and getting preventive care can go a long way to promote health and wellness. Talk with your health care provider about what schedule of regular examinations is right for you. This is a good chance for you to check in with your provider about disease prevention and staying healthy. In between checkups, there are plenty of things you can do on your own. Experts have done a lot of research about which lifestyle changes and preventive measures are most likely to keep you healthy. Ask your health care provider for more information. WEIGHT AND DIET  Eat a healthy diet  Be sure to include plenty of vegetables, fruits, low-fat dairy products, and lean protein.  Do not eat a lot of foods high in solid fats, added sugars, or salt.  Get regular exercise. This is one of the most important things you can do for your health.  Most adults should exercise for at least 150 minutes each week. The exercise should increase your heart rate and make you sweat (moderate-intensity exercise).  Most adults should also do strengthening exercises at least twice a week. This is in addition to the moderate-intensity exercise.  Maintain a healthy weight  Body mass index (BMI) is a measurement that can be used to identify possible weight problems. It estimates body fat based on height and weight. Your health care provider can help determine your BMI and help you achieve or maintain a healthy weight.  For females 64 years of age and older:   A BMI below 18.5 is considered underweight.  A BMI of 18.5 to 24.9 is normal.  A BMI of 25 to 29.9 is considered overweight.  A BMI of 30 and above is considered obese.  Watch levels of cholesterol and blood lipids  You should start having  your blood tested for lipids and cholesterol at 68 years of age, then have this test every 5 years.  You may need to have your cholesterol levels checked more often if:  Your lipid or cholesterol levels are high.  You are older than 68 years of age.  You are at high risk for heart disease.  CANCER SCREENING   Lung Cancer  Lung cancer screening is recommended for adults 69-84 years old who are at high risk for lung cancer because of a history of smoking.  A yearly low-dose CT scan of the lungs is recommended for people who:  Currently smoke.  Have quit within the past 15 years.  Have at least a 30-pack-year history of smoking. A pack year is smoking an average of one pack of cigarettes a day for 1 year.  Yearly screening should continue until it has been 15 years since you quit.  Yearly screening should stop if you develop a health problem that would prevent you from having lung cancer treatment.  Breast Cancer  Practice breast self-awareness. This means understanding how your breasts normally appear and feel.  It also means doing regular breast self-exams. Let your health care provider know about any changes, no matter how small.  If you are in your 20s or 30s, you should have a clinical breast exam (CBE) by a health care provider every 1-3 years as part of a regular health exam.  If you are  30 or older, have a CBE every year. Also consider having a breast X-ray (mammogram) every year.  If you have a family history of breast cancer, talk to your health care provider about genetic screening.  If you are at high risk for breast cancer, talk to your health care provider about having an MRI and a mammogram every year.  Breast cancer gene (BRCA) assessment is recommended for women who have family members with BRCA-related cancers. BRCA-related cancers include:  Breast.  Ovarian.  Tubal.  Peritoneal cancers.  Results of the assessment will determine the need for genetic  counseling and BRCA1 and BRCA2 testing. Cervical Cancer Your health care provider may recommend that you be screened regularly for cancer of the pelvic organs (ovaries, uterus, and vagina). This screening involves a pelvic examination, including checking for microscopic changes to the surface of your cervix (Pap test). You may be encouraged to have this screening done every 3 years, beginning at age 23.  For women ages 41-65, health care providers may recommend pelvic exams and Pap testing every 3 years, or they may recommend the Pap and pelvic exam, combined with testing for human papilloma virus (HPV), every 5 years. Some types of HPV increase your risk of cervical cancer. Testing for HPV may also be done on women of any age with unclear Pap test results.  Other health care providers may not recommend any screening for nonpregnant women who are considered low risk for pelvic cancer and who do not have symptoms. Ask your health care provider if a screening pelvic exam is right for you.  If you have had past treatment for cervical cancer or a condition that could lead to cancer, you need Pap tests and screening for cancer for at least 20 years after your treatment. If Pap tests have been discontinued, your risk factors (such as having a new sexual partner) need to be reassessed to determine if screening should resume. Some women have medical problems that increase the chance of getting cervical cancer. In these cases, your health care provider may recommend more frequent screening and Pap tests. Colorectal Cancer  This type of cancer can be detected and often prevented.  Routine colorectal cancer screening usually begins at 68 years of age and continues through 68 years of age.  Your health care provider may recommend screening at an earlier age if you have risk factors for colon cancer.  Your health care provider may also recommend using home test kits to check for hidden blood in the stool.  A  small camera at the end of a tube can be used to examine your colon directly (sigmoidoscopy or colonoscopy). This is done to check for the earliest forms of colorectal cancer.  Routine screening usually begins at age 65.  Direct examination of the colon should be repeated every 5-10 years through 68 years of age. However, you may need to be screened more often if early forms of precancerous polyps or small growths are found. Skin Cancer  Check your skin from head to toe regularly.  Tell your health care provider about any new moles or changes in moles, especially if there is a change in a mole's shape or color.  Also tell your health care provider if you have a mole that is larger than the size of a pencil eraser.  Always use sunscreen. Apply sunscreen liberally and repeatedly throughout the day.  Protect yourself by wearing long sleeves, pants, a wide-brimmed hat, and sunglasses whenever you are outside. HEART  DISEASE, DIABETES, AND HIGH BLOOD PRESSURE   High blood pressure causes heart disease and increases the risk of stroke. High blood pressure is more likely to develop in:  People who have blood pressure in the high end of the normal range (130-139/85-89 mm Hg).  People who are overweight or obese.  People who are African American.  If you are 21-31 years of age, have your blood pressure checked every 3-5 years. If you are 67 years of age or older, have your blood pressure checked every year. You should have your blood pressure measured twice--once when you are at a hospital or clinic, and once when you are not at a hospital or clinic. Record the average of the two measurements. To check your blood pressure when you are not at a hospital or clinic, you can use:  An automated blood pressure machine at a pharmacy.  A home blood pressure monitor.  If you are between 34 years and 65 years old, ask your health care provider if you should take aspirin to prevent strokes.  Have  regular diabetes screenings. This involves taking a blood sample to check your fasting blood sugar level.  If you are at a normal weight and have a low risk for diabetes, have this test once every three years after 68 years of age.  If you are overweight and have a high risk for diabetes, consider being tested at a younger age or more often. PREVENTING INFECTION  Hepatitis B  If you have a higher risk for hepatitis B, you should be screened for this virus. You are considered at high risk for hepatitis B if:  You were born in a country where hepatitis B is common. Ask your health care provider which countries are considered high risk.  Your parents were born in a high-risk country, and you have not been immunized against hepatitis B (hepatitis B vaccine).  You have HIV or AIDS.  You use needles to inject street drugs.  You live with someone who has hepatitis B.  You have had sex with someone who has hepatitis B.  You get hemodialysis treatment.  You take certain medicines for conditions, including cancer, organ transplantation, and autoimmune conditions. Hepatitis C  Blood testing is recommended for:  Everyone born from [birth year] through 1963.  Anyone with known risk factors for hepatitis C. Sexually transmitted infections (STIs)  You should be screened for sexually transmitted infections (STIs) including gonorrhea and chlamydia if:  You are sexually active and are younger than 68 years of age.  You are older than 68 years of age and your health care provider tells you that you are at risk for this type of infection.  Your sexual activity has changed since you were last screened and you are at an increased risk for chlamydia or gonorrhea. Ask your health care provider if you are at risk.  If you do not have HIV, but are at risk, it may be recommended that you take a prescription medicine daily to prevent HIV infection. This is called pre-exposure prophylaxis (PrEP). You are  considered at risk if:  You are sexually active and do not regularly use condoms or know the HIV status of your partner(s).  You take drugs by injection.  You are sexually active with a partner who has HIV. Talk with your health care provider about whether you are at high risk of being infected with HIV. If you choose to begin PrEP, you should first be tested for HIV. You should  then be tested every 3 months for as long as you are taking PrEP.  PREGNANCY   If you are premenopausal and you may become pregnant, ask your health care provider about preconception counseling.  If you may become pregnant, take 400 to 800 micrograms (mcg) of folic acid every day.  If you want to prevent pregnancy, talk to your health care provider about birth control (contraception). OSTEOPOROSIS AND MENOPAUSE   Osteoporosis is a disease in which the bones lose minerals and strength with aging. This can result in serious bone fractures. Your risk for osteoporosis can be identified using a bone density scan.  If you are 42 years of age or older, or if you are at risk for osteoporosis and fractures, ask your health care provider if you should be screened.  Ask your health care provider whether you should take a calcium or vitamin D supplement to lower your risk for osteoporosis.  Menopause may have certain physical symptoms and risks.  Hormone replacement therapy may reduce some of these symptoms and risks. Talk to your health care provider about whether hormone replacement therapy is right for you.  HOME CARE INSTRUCTIONS   Schedule regular health, dental, and eye exams.  Stay current with your immunizations.   Do not use any tobacco products including cigarettes, chewing tobacco, or electronic cigarettes.  If you are pregnant, do not drink alcohol.  If you are breastfeeding, limit how much and how often you drink alcohol.  Limit alcohol intake to no more than 1 drink per day for nonpregnant women. One  drink equals 12 ounces of beer, 5 ounces of wine, or 1 ounces of hard liquor.  Do not use street drugs.  Do not share needles.  Ask your health care provider for help if you need support or information about quitting drugs.  Tell your health care provider if you often feel depressed.  Tell your health care provider if you have ever been abused or do not feel safe at home.   This information is not intended to replace advice given to you by your health care provider. Make sure you discuss any questions you have with your health care provider.   Document Released: 04/15/2011 Document Revised: 10/21/2014 Document Reviewed: 09/01/2013 Elsevier Interactive Patient Education [employer].

## 2016-06-25 NOTE — Assessment & Plan Note (Signed)
Checking lipid panel and on lovastatin 40 mg daily without side effects.

## 2016-07-23 ENCOUNTER — Other Ambulatory Visit: Payer: Self-pay | Admitting: Internal Medicine

## 2016-07-23 DIAGNOSIS — Z1231 Encounter for screening mammogram for malignant neoplasm of breast: Secondary | ICD-10-CM

## 2016-08-27 ENCOUNTER — Ambulatory Visit
Admission: RE | Admit: 2016-08-27 | Discharge: 2016-08-27 | Disposition: A | Discharge status: Home / Self Care | Payer: Medicare Other | Source: Ambulatory Visit | Attending: Internal Medicine | Admitting: Internal Medicine

## 2016-08-27 DIAGNOSIS — Z1231 Encounter for screening mammogram for malignant neoplasm of breast: Secondary | ICD-10-CM | POA: Diagnosis not present

## 2016-12-15 ENCOUNTER — Other Ambulatory Visit: Payer: Self-pay | Admitting: Internal Medicine

## 2016-12-31 DIAGNOSIS — L821 Other seborrheic keratosis: Secondary | ICD-10-CM | POA: Diagnosis not present

## 2016-12-31 DIAGNOSIS — D225 Melanocytic nevi of trunk: Secondary | ICD-10-CM | POA: Diagnosis not present

## 2016-12-31 DIAGNOSIS — L570 Actinic keratosis: Secondary | ICD-10-CM | POA: Diagnosis not present

## 2017-02-03 DIAGNOSIS — H2513 Age-related nuclear cataract, bilateral: Secondary | ICD-10-CM | POA: Diagnosis not present

## 2017-04-30 DIAGNOSIS — Z7729 Contact with and (suspected ) exposure to other hazardous substances: Secondary | ICD-10-CM | POA: Diagnosis not present

## 2017-04-30 DIAGNOSIS — R87611 Atypical squamous cells cannot exclude high grade squamous intraepithelial lesion on cytologic smear of cervix (ASC-H): Secondary | ICD-10-CM | POA: Diagnosis not present

## 2017-05-21 DIAGNOSIS — R87629 Unspecified abnormal cytological findings in specimens from vagina: Secondary | ICD-10-CM | POA: Diagnosis not present

## 2017-05-21 DIAGNOSIS — N898 Other specified noninflammatory disorders of vagina: Secondary | ICD-10-CM | POA: Diagnosis not present

## 2017-06-10 ENCOUNTER — Other Ambulatory Visit: Payer: Self-pay | Admitting: Internal Medicine

## 2017-06-12 ENCOUNTER — Other Ambulatory Visit: Payer: Self-pay | Admitting: Internal Medicine

## 2017-07-01 ENCOUNTER — Ambulatory Visit (INDEPENDENT_AMBULATORY_CARE_PROVIDER_SITE_OTHER): Payer: Medicare Other | Admitting: Internal Medicine

## 2017-07-01 ENCOUNTER — Other Ambulatory Visit (INDEPENDENT_AMBULATORY_CARE_PROVIDER_SITE_OTHER): Payer: Medicare Other

## 2017-07-01 ENCOUNTER — Encounter: Payer: Self-pay | Admitting: Internal Medicine

## 2017-07-01 VITALS — BP 136/82 | HR 93 | Temp 98.8°F | Ht 62.0 in | Wt 185.0 lb

## 2017-07-01 DIAGNOSIS — Z23 Encounter for immunization: Secondary | ICD-10-CM | POA: Diagnosis not present

## 2017-07-01 DIAGNOSIS — I10 Essential (primary) hypertension: Secondary | ICD-10-CM | POA: Diagnosis not present

## 2017-07-01 DIAGNOSIS — E039 Hypothyroidism, unspecified: Secondary | ICD-10-CM | POA: Diagnosis not present

## 2017-07-01 DIAGNOSIS — Z0000 Encounter for general adult medical examination without abnormal findings: Secondary | ICD-10-CM

## 2017-07-01 DIAGNOSIS — E785 Hyperlipidemia, unspecified: Secondary | ICD-10-CM | POA: Diagnosis not present

## 2017-07-01 LAB — COMPREHENSIVE METABOLIC PANEL
ALT: 21 U/L (ref 0–35)
AST: 23 U/L (ref 0–37)
Albumin: 4.1 g/dL (ref 3.5–5.2)
Alkaline Phosphatase: 68 U/L (ref 39–117)
BILIRUBIN TOTAL: 0.6 mg/dL (ref 0.2–1.2)
BUN: 18 mg/dL (ref 6–23)
CALCIUM: 10.1 mg/dL (ref 8.4–10.5)
CHLORIDE: 103 meq/L (ref 96–112)
CO2: 30 mEq/L (ref 19–32)
CREATININE: 1.03 mg/dL (ref 0.40–1.20)
GFR: 56.49 mL/min — ABNORMAL LOW (ref >60.00)
Glucose, Bld: 90 mg/dL (ref 70–99)
Potassium: 3.3 mEq/L — ABNORMAL LOW (ref 3.5–5.1)
Sodium: 140 mEq/L (ref 135–145)
TOTAL PROTEIN: 7.5 g/dL (ref 6.0–8.3)

## 2017-07-01 LAB — LIPID PANEL
Cholesterol: 137 mg/dL (ref 0–200)
HDL: 61.1 mg/dL (ref >39.00)
LDL Cholesterol: 54 mg/dL (ref 0–99)
NONHDL: 76.15
Total CHOL/HDL Ratio: 2
Triglycerides: 111 mg/dL (ref 0.0–149.0)
VLDL: 22.2 mg/dL (ref 0.0–40.0)

## 2017-07-01 LAB — CBC
HEMATOCRIT: 45.8 % (ref 36.0–46.0)
Hemoglobin: 15.8 g/dL — ABNORMAL HIGH (ref 12.0–15.0)
MCHC: 34.6 g/dL (ref 30.0–36.0)
MCV: 86.5 fl (ref 78.0–100.0)
Platelets: 322 10*3/uL (ref 150.0–400.0)
RBC: 5.29 Mil/uL — ABNORMAL HIGH (ref 3.87–5.11)
RDW: 15.4 % (ref 11.5–15.5)
WBC: 11.6 10*3/uL — ABNORMAL HIGH (ref 4.0–10.5)

## 2017-07-01 LAB — TSH: TSH: 1.72 u[IU]/mL (ref 0.35–4.50)

## 2017-07-01 LAB — HEMOGLOBIN A1C: HEMOGLOBIN A1C: 4.7 % (ref 4.6–6.5)

## 2017-07-01 MED ORDER — BUPROPION HCL 75 MG PO TABS: 75.0000 mg | ORAL_TABLET | Freq: Two times a day (BID) | ORAL | 3 refills | Status: DC

## 2017-07-01 MED ORDER — LEVOTHYROXINE SODIUM 88 MCG PO TABS: 88.0000 ug | ORAL_TABLET | Freq: Every day | ORAL | 3 refills | Status: DC

## 2017-07-01 MED ORDER — HYOSCYAMINE SULFATE 0.125 MG PO TBDP: 0.1250 mg | ORAL_TABLET | Freq: Two times a day (BID) | ORAL | 5 refills | Status: DC | PRN

## 2017-07-01 MED ORDER — HYDROCHLOROTHIAZIDE 25 MG PO TABS: 25.0000 mg | ORAL_TABLET | Freq: Every day | ORAL | 3 refills | Status: DC

## 2017-07-01 MED ORDER — LOVASTATIN 40 MG PO TABS: 40.0000 mg | ORAL_TABLET | Freq: Every day | ORAL | 3 refills | Status: DC

## 2017-07-01 MED ORDER — ALPRAZOLAM 0.5 MG PO TABS: 0.5000 mg | ORAL_TABLET | Freq: Three times a day (TID) | ORAL | 5 refills | Status: DC | PRN

## 2017-07-01 NOTE — Patient Instructions (Signed)
We have done the refills today and will check the labs.   Health Maintenance, Female Adopting a healthy lifestyle and getting preventive care can go a long way to promote health and wellness. Talk with your health care provider about what schedule of regular examinations is right for you. This is a good chance for you to check in with your provider about disease prevention and staying healthy. In between checkups, there are plenty of things you can do on your own. Experts have done a lot of research about which lifestyle changes and preventive measures are most likely to keep you healthy. Ask your health care provider for more information. Weight and diet Eat a healthy diet  Be sure to include plenty of vegetables, fruits, low-fat dairy products, and lean protein.  Do not eat a lot of foods high in solid fats, added sugars, or salt.  Get regular exercise. This is one of the most important things you can do for your health. ? Most adults should exercise for at least 150 minutes each week. The exercise should increase your heart rate and make you sweat (moderate-intensity exercise). ? Most adults should also do strengthening exercises at least twice a week. This is in addition to the moderate-intensity exercise.  Maintain a healthy weight  Body mass index (BMI) is a measurement that can be used to identify possible weight problems. It estimates body fat based on height and weight. Your health care provider can help determine your BMI and help you achieve or maintain a healthy weight.  For females 31 years of age and older: ? A BMI below 18.5 is considered underweight. ? A BMI of 18.5 to 24.9 is normal. ? A BMI of 25 to 29.9 is considered overweight. ? A BMI of 30 and above is considered obese.  Watch levels of cholesterol and blood lipids  You should start having your blood tested for lipids and cholesterol at 69 years of age, then have this test every 5 years.  You may need to have your  cholesterol levels checked more often if: ? Your lipid or cholesterol levels are high. ? You are older than 69 years of age. ? You are at high risk for heart disease.  Cancer screening Lung Cancer  Lung cancer screening is recommended for adults 52-24 years old who are at high risk for lung cancer because of a history of smoking.  A yearly low-dose CT scan of the lungs is recommended for people who: ? Currently smoke. ? Have quit within the past 15 years. ? Have at least a 30-pack-year history of smoking. A pack year is smoking an average of one pack of cigarettes a day for 1 year.  Yearly screening should continue until it has been 15 years since you quit.  Yearly screening should stop if you develop a health problem that would prevent you from having lung cancer treatment.  Breast Cancer  Practice breast self-awareness. This means understanding how your breasts normally appear and feel.  It also means doing regular breast self-exams. Let your health care provider know about any changes, no matter how small.  If you are in your 20s or 30s, you should have a clinical breast exam (CBE) by a health care provider every 1-3 years as part of a regular health exam.  If you are 82 or older, have a CBE every year. Also consider having a breast X-ray (mammogram) every year.  If you have a family history of breast cancer, talk to your  health care provider about genetic screening.  If you are at high risk for breast cancer, talk to your health care provider about having an MRI and a mammogram every year.  Breast cancer gene (BRCA) assessment is recommended for women who have family members with BRCA-related cancers. BRCA-related cancers include: ? Breast. ? Ovarian. ? Tubal. ? Peritoneal cancers.  Results of the assessment will determine the need for genetic counseling and BRCA1 and BRCA2 testing.  Cervical Cancer Your health care provider may recommend that you be screened regularly  for cancer of the pelvic organs (ovaries, uterus, and vagina). This screening involves a pelvic examination, including checking for microscopic changes to the surface of your cervix (Pap test). You may be encouraged to have this screening done every 3 years, beginning at age 79.  For women ages 29-65, health care providers may recommend pelvic exams and Pap testing every 3 years, or they may recommend the Pap and pelvic exam, combined with testing for human papilloma virus (HPV), every 5 years. Some types of HPV increase your risk of cervical cancer. Testing for HPV may also be done on women of any age with unclear Pap test results.  Other health care providers may not recommend any screening for nonpregnant women who are considered low risk for pelvic cancer and who do not have symptoms. Ask your health care provider if a screening pelvic exam is right for you.  If you have had past treatment for cervical cancer or a condition that could lead to cancer, you need Pap tests and screening for cancer for at least 20 years after your treatment. If Pap tests have been discontinued, your risk factors (such as having a new sexual partner) need to be reassessed to determine if screening should resume. Some women have medical problems that increase the chance of getting cervical cancer. In these cases, your health care provider may recommend more frequent screening and Pap tests.  Colorectal Cancer  This type of cancer can be detected and often prevented.  Routine colorectal cancer screening usually begins at 69 years of age and continues through 69 years of age.  Your health care provider may recommend screening at an earlier age if you have risk factors for colon cancer.  Your health care provider may also recommend using home test kits to check for hidden blood in the stool.  A small camera at the end of a tube can be used to examine your colon directly (sigmoidoscopy or colonoscopy). This is done to  check for the earliest forms of colorectal cancer.  Routine screening usually begins at age 48.  Direct examination of the colon should be repeated every 5-10 years through 69 years of age. However, you may need to be screened more often if early forms of precancerous polyps or small growths are found.  Skin Cancer  Check your skin from head to toe regularly.  Tell your health care provider about any new moles or changes in moles, especially if there is a change in a mole's shape or color.  Also tell your health care provider if you have a mole that is larger than the size of a pencil eraser.  Always use sunscreen. Apply sunscreen liberally and repeatedly throughout the day.  Protect yourself by wearing long sleeves, pants, a wide-brimmed hat, and sunglasses whenever you are outside.  Heart disease, diabetes, and high blood pressure  High blood pressure causes heart disease and increases the risk of stroke. High blood pressure is more likely to  develop in: ? People who have blood pressure in the high end of the normal range (130-139/85-89 mm Hg). ? People who are overweight or obese. ? People who are African American.  If you are 33-59 years of age, have your blood pressure checked every 3-5 years. If you are 66 years of age or older, have your blood pressure checked every year. You should have your blood pressure measured twice-once when you are at a hospital or clinic, and once when you are not at a hospital or clinic. Record the average of the two measurements. To check your blood pressure when you are not at a hospital or clinic, you can use: ? An automated blood pressure machine at a pharmacy. ? A home blood pressure monitor.  If you are between 20 years and 51 years old, ask your health care provider if you should take aspirin to prevent strokes.  Have regular diabetes screenings. This involves taking a blood sample to check your fasting blood sugar level. ? If you are at a  normal weight and have a low risk for diabetes, have this test once every three years after 69 years of age. ? If you are overweight and have a high risk for diabetes, consider being tested at a younger age or more often. Preventing infection Hepatitis B  If you have a higher risk for hepatitis B, you should be screened for this virus. You are considered at high risk for hepatitis B if: ? You were born in a country where hepatitis B is common. Ask your health care provider which countries are considered high risk. ? Your parents were born in a high-risk country, and you have not been immunized against hepatitis B (hepatitis B vaccine). ? You have HIV or AIDS. ? You use needles to inject street drugs. ? You live with someone who has hepatitis B. ? You have had sex with someone who has hepatitis B. ? You get hemodialysis treatment. ? You take certain medicines for conditions, including cancer, organ transplantation, and autoimmune conditions.  Hepatitis C  Blood testing is recommended for: ? Everyone born from [birth year] through 1963. ? Anyone with known risk factors for hepatitis C.  Sexually transmitted infections (STIs)  You should be screened for sexually transmitted infections (STIs) including gonorrhea and chlamydia if: ? You are sexually active and are younger than 69 years of age. ? You are older than 69 years of age and your health care provider tells you that you are at risk for this type of infection. ? Your sexual activity has changed since you were last screened and you are at an increased risk for chlamydia or gonorrhea. Ask your health care provider if you are at risk.  If you do not have HIV, but are at risk, it may be recommended that you take a prescription medicine daily to prevent HIV infection. This is called pre-exposure prophylaxis (PrEP). You are considered at risk if: ? You are sexually active and do not regularly use condoms or know the HIV status of your  partner(s). ? You take drugs by injection. ? You are sexually active with a partner who has HIV.  Talk with your health care provider about whether you are at high risk of being infected with HIV. If you choose to begin PrEP, you should first be tested for HIV. You should then be tested every 3 months for as long as you are taking PrEP. Pregnancy  If you are premenopausal and you may become pregnant,  ask your health care provider about preconception counseling.  If you may become pregnant, take 400 to 800 micrograms (mcg) of folic acid every day.  If you want to prevent pregnancy, talk to your health care provider about birth control (contraception). Osteoporosis and menopause  Osteoporosis is a disease in which the bones lose minerals and strength with aging. This can result in serious bone fractures. Your risk for osteoporosis can be identified using a bone density scan.  If you are 42 years of age or older, or if you are at risk for osteoporosis and fractures, ask your health care provider if you should be screened.  Ask your health care provider whether you should take a calcium or vitamin D supplement to lower your risk for osteoporosis.  Menopause may have certain physical symptoms and risks.  Hormone replacement therapy may reduce some of these symptoms and risks. Talk to your health care provider about whether hormone replacement therapy is right for you. Follow these instructions at home:  Schedule regular health, dental, and eye exams.  Stay current with your immunizations.  Do not use any tobacco products including cigarettes, chewing tobacco, or electronic cigarettes.  If you are pregnant, do not drink alcohol.  If you are breastfeeding, limit how much and how often you drink alcohol.  Limit alcohol intake to no more than 1 drink per day for nonpregnant women. One drink equals 12 ounces of beer, 5 ounces of wine, or 1 ounces of hard liquor.  Do not use street  drugs.  Do not share needles.  Ask your health care provider for help if you need support or information about quitting drugs.  Tell your health care provider if you often feel depressed.  Tell your health care provider if you have ever been abused or do not feel safe at home. This information is not intended to replace advice given to you by your health care provider. Make sure you discuss any questions you have with your health care provider. Document Released: 04/15/2011 Document Revised: 03/07/2016 Document Reviewed: 07/04/2015 Elsevier Interactive Patient Education  [employer].

## 2017-07-01 NOTE — Progress Notes (Signed)
   Subjective:    Patient ID: Brandi Ferguson, female    DOB: [birth year], 71 y.o.   MRN: [id number]  HPI Here for medicare wellness, and physical no new complaints. Please see A/P for status and treatment of chronic medical problems.   Diet: heart healthy Physical activity: sedentary, walks once per week Depression/mood screen: negative Hearing: intact to whispered voice, more loss bilaterally than last year but declines further evaluation at this time Visual acuity: grossly normal, performs annual eye exam  ADLs: capable Fall risk: none Home safety: good Cognitive evaluation: intact to orientation, naming, recall and repetition EOL planning: adv directives discussed  I have personally reviewed and have noted 1. The patient's medical and social history - reviewed today no changes 2. Their use of alcohol, tobacco or illicit drugs 3. Their current medications and supplements 4. The patient's functional ability including ADL's, fall risks, home safety risks and hearing or visual impairment. 5. Diet and physical activities 6. Evidence for depression or mood disorders 7. Care team reviewed and updated (available in snapshot)  Review of Systems  Constitutional: Positive for activity change and appetite change. Negative for chills, diaphoresis, fatigue, fever and unexpected weight change.  HENT: Negative.   Eyes: Negative.   Respiratory: Negative for cough, chest tightness and shortness of breath.   Cardiovascular: Negative for chest pain, palpitations and leg swelling.  Gastrointestinal: Negative for abdominal distention, abdominal pain, constipation, diarrhea, nausea and vomiting.  Musculoskeletal: Positive for arthralgias.  Skin: Negative.   Neurological: Negative.   Psychiatric/Behavioral: Negative.       Objective:   Physical Exam  Constitutional: She is oriented to person, place, and time. She appears well-developed and well-nourished.  Overweight  HENT:  Head:  Normocephalic and atraumatic.  Eyes: EOM are normal.  Neck: Normal range of motion.  Cardiovascular: Normal rate and regular rhythm.   Pulmonary/Chest: Effort normal and breath sounds normal. No respiratory distress. She has no wheezes. She has no rales.  Abdominal: Soft. Bowel sounds are normal. She exhibits no distension. There is no tenderness. There is no rebound.  Musculoskeletal: She exhibits no edema.  Neurological: She is alert and oriented to person, place, and time. Coordination normal.  Skin: Skin is warm and dry.  Psychiatric: She has a normal mood and affect.   Vitals:   07/01/17 1509  BP: 136/82  Pulse: 93  Temp: 98.8 F (37.1 C)  TempSrc: Oral  SpO2: 99%  Weight: 185 lb (83.9 kg)  Height: 5\' 2"  (1.575 m)      Assessment & Plan:  Flu shot given at visit

## 2017-07-02 NOTE — Assessment & Plan Note (Signed)
Checking TSH and adjust synthroid 88 mcg as needed.

## 2017-07-04 NOTE — Assessment & Plan Note (Signed)
BP at goal on her hctz, checking CMP and adjust as needed.  

## 2017-07-04 NOTE — Assessment & Plan Note (Signed)
Checking lipid panel and adjust lovastatin as needed.

## 2017-07-04 NOTE — Assessment & Plan Note (Signed)
Flu shot given at visit, counseled about shingrix and she declines today. Colonoscopy, mammogram, DEXA up to date. She thinks she has had her pneumonia vaccines and will check at home and let us know. Counseled about sun safety and mole surveillance. Given 10 year screening recommendations.

## 2017-07-15 ENCOUNTER — Other Ambulatory Visit: Payer: Self-pay | Admitting: Internal Medicine

## 2017-07-15 DIAGNOSIS — Z1231 Encounter for screening mammogram for malignant neoplasm of breast: Secondary | ICD-10-CM

## 2017-08-28 ENCOUNTER — Ambulatory Visit
Admission: RE | Admit: 2017-08-28 | Discharge: 2017-08-28 | Disposition: A | Discharge status: Home / Self Care | Payer: Medicare Other | Source: Ambulatory Visit | Attending: Internal Medicine | Admitting: Internal Medicine

## 2017-08-28 ENCOUNTER — Ambulatory Visit: Payer: Medicare Other

## 2017-08-28 DIAGNOSIS — Z1231 Encounter for screening mammogram for malignant neoplasm of breast: Secondary | ICD-10-CM | POA: Diagnosis not present

## 2017-09-05 DIAGNOSIS — J019 Acute sinusitis, unspecified: Secondary | ICD-10-CM | POA: Diagnosis not present

## 2017-09-15 ENCOUNTER — Encounter: Payer: Self-pay | Admitting: Internal Medicine

## 2017-09-15 ENCOUNTER — Ambulatory Visit (INDEPENDENT_AMBULATORY_CARE_PROVIDER_SITE_OTHER): Payer: Medicare Other | Admitting: Internal Medicine

## 2017-09-15 DIAGNOSIS — J0111 Acute recurrent frontal sinusitis: Secondary | ICD-10-CM

## 2017-09-15 MED ORDER — PREDNISONE 20 MG PO TABS: 40.0000 mg | ORAL_TABLET | Freq: Every day | ORAL | 0 refills | Status: DC

## 2017-09-15 MED ORDER — DOXYCYCLINE HYCLATE 100 MG PO TABS: 100.0000 mg | ORAL_TABLET | Freq: Two times a day (BID) | ORAL | 0 refills | Status: DC

## 2017-09-15 NOTE — Progress Notes (Signed)
   Subjective:    Patient ID: Brandi Ferguson, female    DOB: Sep 18, 1946, 71 y.o.   MRN: [id number]  HPI The patient is a 71 YO female coming in for sinus problems, ongoing for about 1 month. Several weeks ago sought care in urgent care and was given amoxicillin although she told the provider there that this never works for her. She is having some chills, sinus pain and pressure, drainage, mild cough with production. Overall stable to worsening. The amoxicillin did not help her at all. She is also taking otc medications without relief. Taking flonase and also otc allergy medicine.  Review of Systems  Constitutional: Positive for activity change, appetite change and chills. Negative for fatigue, fever and unexpected weight change.  HENT: Positive for congestion, postnasal drip, rhinorrhea, sinus pressure and sinus pain. Negative for ear discharge, ear pain, sneezing, sore throat, tinnitus, trouble swallowing and voice change.   Eyes: Negative.   Respiratory: Positive for cough. Negative for chest tightness, shortness of breath and wheezing.   Cardiovascular: Negative.   Gastrointestinal: Negative.   Neurological: Negative.        Objective:   Physical Exam  Constitutional: She is oriented to person, place, and time. She appears well-developed and well-nourished.  HENT:  Head: Normocephalic and atraumatic.  Oropharynx with redness and clear drainage, nose with swollen turbinates, TMs normal bilaterally, frontal sinus pain  Eyes: EOM are normal.  Neck: Normal range of motion. No thyromegaly present.  Cardiovascular: Normal rate and regular rhythm.  Pulmonary/Chest: Effort normal and breath sounds normal. No respiratory distress. She has no wheezes. She has no rales.  Abdominal: Soft.  Lymphadenopathy:    She has no cervical adenopathy.  Neurological: She is alert and oriented to person, place, and time.  Skin: Skin is warm and dry.   Vitals:   09/15/17 1555  BP: 138/80  Pulse: 84    Temp: 97.6 F (36.4 C)  TempSrc: Oral  SpO2: 99%  Weight: 187 lb (84.8 kg)  Height: 5\' 2"  (1.575 m)      Assessment & Plan:

## 2017-09-15 NOTE — Patient Instructions (Signed)
We have sent in prednisone to take 2 pills daily for 5 days.   We have sent in doxycycline 1 pill twice a day for 1 week.

## 2017-09-17 NOTE — Assessment & Plan Note (Signed)
Rx for doxycycline and prednisone for recurrent not resolving sinus infection.

## 2017-09-24 ENCOUNTER — Encounter: Payer: Self-pay | Admitting: Internal Medicine

## 2017-09-24 ENCOUNTER — Ambulatory Visit (INDEPENDENT_AMBULATORY_CARE_PROVIDER_SITE_OTHER): Payer: Medicare Other | Admitting: Internal Medicine

## 2017-09-24 VITALS — BP 150/82 | HR 92 | Temp 99.7°F | Resp 16 | Wt 186.0 lb

## 2017-09-24 DIAGNOSIS — J320 Chronic maxillary sinusitis: Secondary | ICD-10-CM | POA: Insufficient documentation

## 2017-09-24 DIAGNOSIS — J40 Bronchitis, not specified as acute or chronic: Secondary | ICD-10-CM | POA: Diagnosis not present

## 2017-09-24 MED ORDER — CEFDINIR 300 MG PO CAPS: 300.0000 mg | ORAL_CAPSULE | Freq: Two times a day (BID) | ORAL | 0 refills | Status: DC

## 2017-09-24 NOTE — Patient Instructions (Signed)
Take the antibiotic as prescribed.  Take over the counter cold medications as needed for symptomatic relief.    Increase rest, fluids.   Call if no improvement

## 2017-09-24 NOTE — Assessment & Plan Note (Signed)
Symptoms consistent with bronchitis Has been sick for more than 2 weeks Start omnicef Rest, fluids Start mucinex, take otc cold meds  Call if no improvement

## 2017-09-24 NOTE — Assessment & Plan Note (Signed)
Chronic sinusitis with bronchitis Has been sick for more than 2 weeks Start omnicef Rest, fluids Start mucinex, take otc cold meds  Call if no improvement

## 2017-09-24 NOTE — Progress Notes (Signed)
Subjective:    Patient ID: Brandi Ferguson, female    DOB: 1946/02/02, 71 y.o.   MRN: [id number]  HPI She is here for an acute visit for cold symptoms.  Her symptoms started 2 and a half weeks ago.  She saw Dr Sharlet Salina then and was diagnosed with a sinus infection.  She took doxycycline and prednisone.  It helped a little for a few days only.    She is experiencing cough that is productive, but it is hard to get any mucus up.  She has a low grade fever today, nasal congestion, ear pressure, sinus pressure, sore throat, SOB, chest tightness, nausea, mild body aches, headaches and lightheadedness.    She has taken ibuprofen.    Medications and allergies reviewed with patient and updated if appropriate.  Patient Active Problem List   Diagnosis Date Noted  . Acute sinusitis 05/22/2011  . Routine general medical examination at a health care facility 04/03/2011  . Hyperlipidemia 06/27/2008  . Obesity (BMI 30-39.9) 06/27/2008  . Hypothyroidism 02/06/2008  . ANXIETY DEPRESSION 02/06/2008  . Essential hypertension 02/06/2008  . Irritable bowel syndrome 02/06/2008    Current Outpatient Medications on File Prior to Visit  Medication Sig Dispense Refill  . ALPRAZolam (XANAX) 0.5 MG tablet Take 1 tablet (0.5 mg total) by mouth 3 (three) times daily as needed. 90 tablet 5  . aspirin 81 MG tablet Take 1 tablet (81 mg total) by mouth daily. 30 tablet 11  . buPROPion (WELLBUTRIN) 75 MG tablet Take 1 tablet (75 mg total) by mouth 2 (two) times daily. 180 tablet 3  . fluticasone (FLONASE) 50 MCG/ACT nasal spray Place 1 spray into both nostrils daily. 16 g 11  . hydrochlorothiazide (HYDRODIURIL) 25 MG tablet Take 1 tablet (25 mg total) by mouth daily. 90 tablet 3  . hyoscyamine (ANASPAZ) 0.125 MG TBDP disintergrating tablet Place 1 tablet (0.125 mg total) under the tongue 2 (two) times daily as needed. 60 tablet 5  . levothyroxine (SYNTHROID, LEVOTHROID) 88 MCG tablet Take 1 tablet (88 mcg  total) by mouth daily before breakfast. 90 tablet 3  . lovastatin (MEVACOR) 40 MG tablet Take 1 tablet (40 mg total) by mouth at bedtime. 90 tablet 3   No current facility-administered medications on file prior to visit.     Past Medical History:  Diagnosis Date  . 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 ANXIETY DEPRESSION 02/06/2008  . Cough 09/27/2008  . DIVERTICULOSIS OF COLON 02/06/2008  . HYPERLIPIDEMIA 06/27/2008  . HYPERTENSION 02/06/2008  . HYPOTHYROIDISM 02/06/2008  . Irritable bowel syndrome 02/06/2008  . Lump or mass in breast 02/06/2008  . Overweight(278.02) 06/27/2008  . SINUSITIS- ACUTE-NOS 01/04/2010  . URI 09/04/2009    Past Surgical History:  Procedure Laterality Date  . BREAST SURGERY  1984   Right lumpectomy- Benign  . CERVICAL CONE BIOPSY  1977  . LAPAROSCOPY  1972   Fertility work-up  . SKIN LESION EXCISION  2006   Left breast  . TONSILLECTOMY      Social History   Socioeconomic History  . Marital status: Married    Spouse name: Barnabas Lister  . Number of children: 1  . Years of education: 36  . Highest education level: None  Social Needs  . Financial resource strain: None  . Food insecurity - worry: None  . Food insecurity - inability: None  . Transportation needs - medical: None  . Transportation needs - non-medical: None  Occupational History  . Occupation: elementary school  teacher  Tobacco Use  . Smoking status: Never Smoker  . Smokeless tobacco: Never Used  Substance and Sexual Activity  . Alcohol use: No  . Drug use: No  . Sexual activity: Yes    Partners: Male  Other Topics Concern  . None  Social History Narrative   HSG, Completed [employer] -ed. Married "27", 1 adopted daughter "35". 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 Marriage in [place]. 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 Prolonged artificial hydration or nutrition.      Family History  Problem Relation Age of Onset  . Alzheimer's disease Mother   . Alcohol abuse Father   . Diabetes Father   . Hyperlipidemia Father   . Hypertension Father   . Coronary artery disease Father   . Cancer Sister     Review of Systems  Constitutional: Positive for fever. Negative for chills.  HENT: Positive for congestion, ear pain, sinus pressure and sore throat. Negative for sinus pain.   Respiratory: Positive for cough, chest tightness and shortness of breath (mild). Negative for wheezing.   Gastrointestinal: Positive for nausea. Negative for diarrhea.  Musculoskeletal: Positive for myalgias.  Neurological: Positive for light-headedness and headaches.       Objective:   Vitals:   09/24/17 1421  BP: (!) 150/82  Pulse: 92  Resp: 16  Temp: 99.7 F (37.6 C)  SpO2: 98%   Filed Weights   09/24/17 1421  Weight: 186 lb (84.4 kg)   Body mass index is 34.02 kg/m.  Wt Readings from Last 3 Encounters:  09/24/17 186 lb (84.4 kg)  09/15/17 187 lb (84.8 kg)  07/01/17 185 lb (83.9 kg)     Physical Exam GENERAL APPEARANCE: Appears stated age, well appearing, NAD EYES: conjunctiva clear, no icterus HEENT: bilateral tympanic membranes and ear canals normal, oropharynx with mild erythema, no thyromegaly, trachea midline, no cervical or supraclavicular lymphadenopathy LUNGS: Clear to auscultation without wheeze or crackles, unlabored breathing, good air entry bilaterally CARDIOVASCULAR: Normal S1,S2 without murmurs, no edema SKIN: warm, dry        Assessment & Plan:   See Problem List for Assessment and Plan of chronic medical problems.

## 2017-11-23 DIAGNOSIS — J019 Acute sinusitis, unspecified: Secondary | ICD-10-CM | POA: Diagnosis not present

## 2017-12-26 ENCOUNTER — Other Ambulatory Visit: Payer: Self-pay | Admitting: Internal Medicine

## 2017-12-29 NOTE — Telephone Encounter (Signed)
Control database checked last refill: 11/26/2017 LOV: 07/01/2017

## 2018-05-04 DIAGNOSIS — Z01419 Encounter for gynecological examination (general) (routine) without abnormal findings: Secondary | ICD-10-CM | POA: Diagnosis not present

## 2018-05-04 DIAGNOSIS — R87610 Atypical squamous cells of undetermined significance on cytologic smear of cervix (ASC-US): Secondary | ICD-10-CM | POA: Diagnosis not present

## 2018-05-04 DIAGNOSIS — Z7729 Contact with and (suspected ) exposure to other hazardous substances: Secondary | ICD-10-CM | POA: Diagnosis not present

## 2018-07-02 ENCOUNTER — Encounter: Payer: Self-pay | Admitting: Internal Medicine

## 2018-07-02 ENCOUNTER — Other Ambulatory Visit (INDEPENDENT_AMBULATORY_CARE_PROVIDER_SITE_OTHER): Payer: Medicare Other

## 2018-07-02 ENCOUNTER — Ambulatory Visit (INDEPENDENT_AMBULATORY_CARE_PROVIDER_SITE_OTHER): Payer: Medicare Other | Admitting: Internal Medicine

## 2018-07-02 VITALS — BP 118/82 | HR 78 | Temp 98.2°F | Ht 62.0 in | Wt 185.0 lb

## 2018-07-02 DIAGNOSIS — Z23 Encounter for immunization: Secondary | ICD-10-CM

## 2018-07-02 DIAGNOSIS — K582 Mixed irritable bowel syndrome: Secondary | ICD-10-CM

## 2018-07-02 DIAGNOSIS — E785 Hyperlipidemia, unspecified: Secondary | ICD-10-CM

## 2018-07-02 DIAGNOSIS — Z0000 Encounter for general adult medical examination without abnormal findings: Secondary | ICD-10-CM

## 2018-07-02 DIAGNOSIS — I10 Essential (primary) hypertension: Secondary | ICD-10-CM

## 2018-07-02 DIAGNOSIS — F324 Major depressive disorder, single episode, in partial remission: Secondary | ICD-10-CM | POA: Diagnosis not present

## 2018-07-02 DIAGNOSIS — E669 Obesity, unspecified: Secondary | ICD-10-CM

## 2018-07-02 DIAGNOSIS — E039 Hypothyroidism, unspecified: Secondary | ICD-10-CM

## 2018-07-02 LAB — CBC
HCT: 45.6 % (ref 36.0–46.0)
Hemoglobin: 15.9 g/dL — ABNORMAL HIGH (ref 12.0–15.0)
MCHC: 34.8 g/dL (ref 30.0–36.0)
MCV: 85.9 fl (ref 78.0–100.0)
PLATELETS: 312 10*3/uL (ref 150.0–400.0)
RBC: 5.31 Mil/uL — ABNORMAL HIGH (ref 3.87–5.11)
RDW: 14.8 % (ref 11.5–15.5)
WBC: 8.5 10*3/uL (ref 4.0–10.5)

## 2018-07-02 LAB — LIPID PANEL
Cholesterol: 125 mg/dL (ref 0–200)
HDL: 45.7 mg/dL (ref >39.00)
LDL Cholesterol: 60 mg/dL (ref 0–99)
NONHDL: 79.28
Total CHOL/HDL Ratio: 3
Triglycerides: 98 mg/dL (ref 0.0–149.0)
VLDL: 19.6 mg/dL (ref 0.0–40.0)

## 2018-07-02 LAB — COMPREHENSIVE METABOLIC PANEL
ALT: 15 U/L (ref 0–35)
AST: 25 U/L (ref 0–37)
Albumin: 3.9 g/dL (ref 3.5–5.2)
Alkaline Phosphatase: 64 U/L (ref 39–117)
BUN: 13 mg/dL (ref 6–23)
CHLORIDE: 106 meq/L (ref 96–112)
CO2: 27 meq/L (ref 19–32)
Calcium: 9.8 mg/dL (ref 8.4–10.5)
Creatinine, Ser: 0.99 mg/dL (ref 0.40–1.20)
GFR: 58.96 mL/min — AB (ref >60.00)
GLUCOSE: 91 mg/dL (ref 70–99)
Potassium: 3.7 mEq/L (ref 3.5–5.1)
Sodium: 143 mEq/L (ref 135–145)
Total Bilirubin: 0.8 mg/dL (ref 0.2–1.2)
Total Protein: 7.2 g/dL (ref 6.0–8.3)

## 2018-07-02 LAB — HEMOGLOBIN A1C: Hgb A1c MFr Bld: 4.8 % (ref 4.6–6.5)

## 2018-07-02 MED ORDER — LEVOTHYROXINE SODIUM 88 MCG PO TABS: 88.0000 ug | ORAL_TABLET | Freq: Every day | ORAL | 3 refills | Status: DC

## 2018-07-02 MED ORDER — HYDROCHLOROTHIAZIDE 25 MG PO TABS: 25.0000 mg | ORAL_TABLET | Freq: Every day | ORAL | 3 refills | Status: DC

## 2018-07-02 MED ORDER — ALPRAZOLAM 0.5 MG PO TABS: 0.5000 mg | ORAL_TABLET | Freq: Three times a day (TID) | ORAL | 5 refills | Status: DC | PRN

## 2018-07-02 MED ORDER — BUPROPION HCL 75 MG PO TABS: 75.0000 mg | ORAL_TABLET | Freq: Two times a day (BID) | ORAL | 3 refills | Status: DC

## 2018-07-02 MED ORDER — LOVASTATIN 40 MG PO TABS: 40.0000 mg | ORAL_TABLET | Freq: Every day | ORAL | 3 refills | Status: DC

## 2018-07-02 MED ORDER — FAMOTIDINE 40 MG PO TABS: 40.0000 mg | ORAL_TABLET | Freq: Every day | ORAL | 3 refills | Status: DC

## 2018-07-02 MED ORDER — HYOSCYAMINE SULFATE 0.125 MG PO TBDP: 0.1250 mg | ORAL_TABLET | Freq: Two times a day (BID) | ORAL | 5 refills | Status: DC | PRN

## 2018-07-02 NOTE — Patient Instructions (Signed)
We have sent in pepcid to take daily for 2-3 months to help heal the esophagus. Then you can use it daily as needed for heartburn.   We have done the EKG today which does not look different from last time.   We are checking the labs and will send you results on mychart.   Health Maintenance, Female Adopting a healthy lifestyle and getting preventive care can go a long way to promote health and wellness. Talk with your health care provider about what schedule of regular examinations is right for you. This is a good chance for you to check in with your provider about disease prevention and staying healthy. In between checkups, there are plenty of things you can do on your own. Experts have done a lot of research about which lifestyle changes and preventive measures are most likely to keep you healthy. Ask your health care provider for more information. Weight and diet Eat a healthy diet  Be sure to include plenty of vegetables, fruits, low-fat dairy products, and lean protein.  Do not eat a lot of foods high in solid fats, added sugars, or salt.  Get regular exercise. This is one of the most important things you can do for your health. ? Most adults should exercise for at least 150 minutes each week. The exercise should increase your heart rate and make you sweat (moderate-intensity exercise). ? Most adults should also do strengthening exercises at least twice a week. This is in addition to the moderate-intensity exercise.  Maintain a healthy weight  Body mass index (BMI) is a measurement that can be used to identify possible weight problems. It estimates body fat based on height and weight. Your health care provider can help determine your BMI and help you achieve or maintain a healthy weight.  For females 53 years of age and older: ? A BMI below 18.5 is considered underweight. ? A BMI of 18.5 to 24.9 is normal. ? A BMI of 25 to 29.9 is considered overweight. ? A BMI of 30 and above is  considered obese.  Watch levels of cholesterol and blood lipids  You should start having your blood tested for lipids and cholesterol at 70 years of age, then have this test every 5 years.  You may need to have your cholesterol levels checked more often if: ? Your lipid or cholesterol levels are high. ? You are older than 70 years of age. ? You are at high risk for heart disease.  Cancer screening Lung Cancer  Lung cancer screening is recommended for adults 43-62 years old who are at high risk for lung cancer because of a history of smoking.  A yearly low-dose CT scan of the lungs is recommended for people who: ? Currently smoke. ? Have quit within the past 15 years. ? Have at least a 30-pack-year history of smoking. A pack year is smoking an average of one pack of cigarettes a day for 1 year.  Yearly screening should continue until it has been 15 years since you quit.  Yearly screening should stop if you develop a health problem that would prevent you from having lung cancer treatment.  Breast Cancer  Practice breast self-awareness. This means understanding how your breasts normally appear and feel.  It also means doing regular breast self-exams. Let your health care provider know about any changes, no matter how small.  If you are in your 20s or 30s, you should have a clinical breast exam (CBE) by a health care  provider every 1-3 years as part of a regular health exam.  If you are 44 or older, have a CBE every year. Also consider having a breast X-ray (mammogram) every year.  If you have a family history of breast cancer, talk to your health care provider about genetic screening.  If you are at high risk for breast cancer, talk to your health care provider about having an MRI and a mammogram every year.  Breast cancer gene (BRCA) assessment is recommended for women who have family members with BRCA-related cancers. BRCA-related cancers  include: ? Breast. ? Ovarian. ? Tubal. ? Peritoneal cancers.  Results of the assessment will determine the need for genetic counseling and BRCA1 and BRCA2 testing.  Cervical Cancer Your health care provider may recommend that you be screened regularly for cancer of the pelvic organs (ovaries, uterus, and vagina). This screening involves a pelvic examination, including checking for microscopic changes to the surface of your cervix (Pap test). You may be encouraged to have this screening done every 3 years, beginning at age 3.  For women ages 27-65, health care providers may recommend pelvic exams and Pap testing every 3 years, or they may recommend the Pap and pelvic exam, combined with testing for human papilloma virus (HPV), every 5 years. Some types of HPV increase your risk of cervical cancer. Testing for HPV may also be done on women of any age with unclear Pap test results.  Other health care providers may not recommend any screening for nonpregnant women who are considered low risk for pelvic cancer and who do not have symptoms. Ask your health care provider if a screening pelvic exam is right for you.  If you have had past treatment for cervical cancer or a condition that could lead to cancer, you need Pap tests and screening for cancer for at least 20 years after your treatment. If Pap tests have been discontinued, your risk factors (such as having a new sexual partner) need to be reassessed to determine if screening should resume. Some women have medical problems that increase the chance of getting cervical cancer. In these cases, your health care provider may recommend more frequent screening and Pap tests.  Colorectal Cancer  This type of cancer can be detected and often prevented.  Routine colorectal cancer screening usually begins at 70 years of age and continues through 70 years of age.  Your health care provider may recommend screening at an earlier age if you have risk factors  for colon cancer.  Your health care provider may also recommend using home test kits to check for hidden blood in the stool.  A small camera at the end of a tube can be used to examine your colon directly (sigmoidoscopy or colonoscopy). This is done to check for the earliest forms of colorectal cancer.  Routine screening usually begins at age 27.  Direct examination of the colon should be repeated every 5-10 years through 70 years of age. However, you may need to be screened more often if early forms of precancerous polyps or small growths are found.  Skin Cancer  Check your skin from head to toe regularly.  Tell your health care provider about any new moles or changes in moles, especially if there is a change in a mole's shape or color.  Also tell your health care provider if you have a mole that is larger than the size of a pencil eraser.  Always use sunscreen. Apply sunscreen liberally and repeatedly throughout the day.  Protect yourself by wearing long sleeves, pants, a wide-brimmed hat, and sunglasses whenever you are outside.  Heart disease, diabetes, and high blood pressure  High blood pressure causes heart disease and increases the risk of stroke. High blood pressure is more likely to develop in: ? People who have blood pressure in the high end of the normal range (130-139/85-89 mm Hg). ? People who are overweight or obese. ? People who are African American.  If you are 57-39 years of age, have your blood pressure checked every 3-5 years. If you are 19 years of age or older, have your blood pressure checked every year. You should have your blood pressure measured twice-once when you are at a hospital or clinic, and once when you are not at a hospital or clinic. Record the average of the two measurements. To check your blood pressure when you are not at a hospital or clinic, you can use: ? An automated blood pressure machine at a pharmacy. ? A home blood pressure monitor.  If  you are between 19 years and 38 years old, ask your health care provider if you should take aspirin to prevent strokes.  Have regular diabetes screenings. This involves taking a blood sample to check your fasting blood sugar level. ? If you are at a normal weight and have a low risk for diabetes, have this test once every three years after 70 years of age. ? If you are overweight and have a high risk for diabetes, consider being tested at a younger age or more often. Preventing infection Hepatitis B  If you have a higher risk for hepatitis B, you should be screened for this virus. You are considered at high risk for hepatitis B if: ? You were born in a country where hepatitis B is common. Ask your health care provider which countries are considered high risk. ? Your parents were born in a high-risk country, and you have not been immunized against hepatitis B (hepatitis B vaccine). ? You have HIV or AIDS. ? You use needles to inject street drugs. ? You live with someone who has hepatitis B. ? You have had sex with someone who has hepatitis B. ? You get hemodialysis treatment. ? You take certain medicines for conditions, including cancer, organ transplantation, and autoimmune conditions.  Hepatitis C  Blood testing is recommended for: ? Everyone born from [birth year] through 1963. ? Anyone with known risk factors for hepatitis C.  Sexually transmitted infections (STIs)  You should be screened for sexually transmitted infections (STIs) including gonorrhea and chlamydia if: ? You are sexually active and are younger than 70 years of age. ? You are older than 70 years of age and your health care provider tells you that you are at risk for this type of infection. ? Your sexual activity has changed since you were last screened and you are at an increased risk for chlamydia or gonorrhea. Ask your health care provider if you are at risk.  If you do not have HIV, but are at risk, it may be recommended  that you take a prescription medicine daily to prevent HIV infection. This is called pre-exposure prophylaxis (PrEP). You are considered at risk if: ? You are sexually active and do not regularly use condoms or know the HIV status of your partner(s). ? You take drugs by injection. ? You are sexually active with a partner who has HIV.  Talk with your health care provider about whether you are at high risk of  being infected with HIV. If you choose to begin PrEP, you should first be tested for HIV. You should then be tested every 3 months for as long as you are taking PrEP. Pregnancy  If you are premenopausal and you may become pregnant, ask your health care provider about preconception counseling.  If you may become pregnant, take 400 to 800 micrograms (mcg) of folic acid every day.  If you want to prevent pregnancy, talk to your health care provider about birth control (contraception). Osteoporosis and menopause  Osteoporosis is a disease in which the bones lose minerals and strength with aging. This can result in serious bone fractures. Your risk for osteoporosis can be identified using a bone density scan.  If you are 63 years of age or older, or if you are at risk for osteoporosis and fractures, ask your health care provider if you should be screened.  Ask your health care provider whether you should take a calcium or vitamin D supplement to lower your risk for osteoporosis.  Menopause may have certain physical symptoms and risks.  Hormone replacement therapy may reduce some of these symptoms and risks. Talk to your health care provider about whether hormone replacement therapy is right for you. Follow these instructions at home:  Schedule regular health, dental, and eye exams.  Stay current with your immunizations.  Do not use any tobacco products including cigarettes, chewing tobacco, or electronic cigarettes.  If you are pregnant, do not drink alcohol.  If you are  breastfeeding, limit how much and how often you drink alcohol.  Limit alcohol intake to no more than 1 drink per day for nonpregnant women. One drink equals 12 ounces of beer, 5 ounces of wine, or 1 ounces of hard liquor.  Do not use street drugs.  Do not share needles.  Ask your health care provider for help if you need support or information about quitting drugs.  Tell your health care provider if you often feel depressed.  Tell your health care provider if you have ever been abused or do not feel safe at home. This information is not intended to replace advice given to you by your health care provider. Make sure you discuss any questions you have with your health care provider. Document Released: 04/15/2011 Document Revised: 03/07/2016 Document Reviewed: 07/04/2015 Elsevier Interactive Patient Education  [employer].

## 2018-07-02 NOTE — Assessment & Plan Note (Signed)
Working on weight loss, not exercising currently and recommended to start execise 30 minutes daily 5 days or more weekly.

## 2018-07-02 NOTE — Assessment & Plan Note (Signed)
BP at goal on HCTZ. Checking CMP and adjust as needed.

## 2018-07-02 NOTE — Assessment & Plan Note (Signed)
Checking lipid panel and adjust as needed lovastatin 40 mg daily.

## 2018-07-02 NOTE — Progress Notes (Signed)
   Subjective:    Patient ID: Brandi Ferguson, female    DOB: 1946-07-11, 72 y.o.   MRN: [id number]  HPI Here for medicare wellness and physical, no new complaints. Please see A/P for status and treatment of chronic medical problems.   Diet: heart healthy Physical activity: sedentary Depression/mood screen: negative Hearing: moderate loss bilaterally Visual acuity: grossly normal with lens, performs annual eye exam  ADLs: capable Fall risk: none Home safety: good Cognitive evaluation: intact to orientation, naming, recall and repetition EOL planning: adv directives discussed  I have personally reviewed and have noted 1. The patient's medical and social history - reviewed today no changes 2. Their use of alcohol, tobacco or illicit drugs 3. Their current medications and supplements 4. The patient's functional ability including ADL's, fall risks, home safety risks and hearing or visual impairment. 5. Diet and physical activities 6. Evidence for depression or mood disorders 7. Care team reviewed and updated (available in snapshot)  Review of Systems  Constitutional: Negative.   HENT: Negative.   Eyes: Negative.   Respiratory: Negative for cough, chest tightness and shortness of breath.   Cardiovascular: Negative for chest pain, palpitations and leg swelling.  Gastrointestinal: Negative for abdominal distention, abdominal pain, constipation, diarrhea, nausea and vomiting.  Musculoskeletal: Negative.   Skin: Negative.   Neurological: Negative.   Psychiatric/Behavioral: Negative.       Objective:   Physical Exam  Constitutional: She is oriented to person, place, and time. She appears well-developed and well-nourished.  HENT:  Head: Normocephalic and atraumatic.  Eyes: EOM are normal.  Neck: Normal range of motion.  Cardiovascular: Normal rate and regular rhythm.  Pulmonary/Chest: Effort normal and breath sounds normal. No respiratory distress. She has no wheezes. She has  no rales.  Abdominal: Soft. Bowel sounds are normal. She exhibits no distension. There is no tenderness. There is no rebound.  Musculoskeletal: She exhibits no edema.  Neurological: She is alert and oriented to person, place, and time. Coordination normal.  Skin: Skin is warm and dry.  Psychiatric: She has a normal mood and affect.   Vitals:   07/02/18 0800  BP: 118/82  Pulse: 78  Temp: 98.2 F (36.8 C)  TempSrc: Oral  SpO2: 99%  Weight: 185 lb (83.9 kg)  Height: 5\' 2"  (1.575 m)   EKG: Rate 81, axis normal, interval normal, sinus, no st or t wave changes, no change when compared to 2012    Assessment & Plan:  Flu shot given at visit

## 2018-07-02 NOTE — Assessment & Plan Note (Signed)
Taking synthroid 88 mcg daily and refilled today.

## 2018-07-02 NOTE — Assessment & Plan Note (Signed)
Taking wellbutrin 75 mg BID and xanax up to TID prn. She is aware of risk of dependence and memory changes and falls. She wishes to continue given the increase in QOL.

## 2018-07-02 NOTE — Assessment & Plan Note (Signed)
Flu shot given. Pneumonia declines. Shingrix declines. Tetanus declines. Colonoscopy up to date. Mammogram up to date, pap smear up to date and dexa up to date. Counseled about sun safety and mole surveillance. Counseled about the dangers of distracted driving. Given 10 year screening recommendations.

## 2018-07-02 NOTE — Assessment & Plan Note (Signed)
Stable and uses hyoscyamine when needed for flare. Since adding 3-4 prunes daily having less problems.

## 2018-07-03 ENCOUNTER — Encounter: Payer: Medicare Other | Admitting: Internal Medicine

## 2018-07-06 DIAGNOSIS — D1801 Hemangioma of skin and subcutaneous tissue: Secondary | ICD-10-CM | POA: Diagnosis not present

## 2018-07-06 DIAGNOSIS — L821 Other seborrheic keratosis: Secondary | ICD-10-CM | POA: Diagnosis not present

## 2018-07-06 DIAGNOSIS — D225 Melanocytic nevi of trunk: Secondary | ICD-10-CM | POA: Diagnosis not present

## 2018-07-06 DIAGNOSIS — L814 Other melanin hyperpigmentation: Secondary | ICD-10-CM | POA: Diagnosis not present

## 2018-07-17 ENCOUNTER — Other Ambulatory Visit: Payer: Self-pay | Admitting: Internal Medicine

## 2018-07-17 DIAGNOSIS — Z1231 Encounter for screening mammogram for malignant neoplasm of breast: Secondary | ICD-10-CM

## 2018-08-31 ENCOUNTER — Ambulatory Visit
Admission: RE | Admit: 2018-08-31 | Discharge: 2018-08-31 | Disposition: A | Discharge status: Home / Self Care | Payer: Medicare Other | Source: Ambulatory Visit | Attending: Internal Medicine | Admitting: Internal Medicine

## 2018-08-31 DIAGNOSIS — Z1231 Encounter for screening mammogram for malignant neoplasm of breast: Secondary | ICD-10-CM | POA: Diagnosis not present

## 2018-09-30 ENCOUNTER — Ambulatory Visit (INDEPENDENT_AMBULATORY_CARE_PROVIDER_SITE_OTHER): Payer: Medicare Other | Admitting: Internal Medicine

## 2018-09-30 ENCOUNTER — Encounter: Payer: Self-pay | Admitting: Internal Medicine

## 2018-09-30 DIAGNOSIS — F324 Major depressive disorder, single episode, in partial remission: Secondary | ICD-10-CM | POA: Diagnosis not present

## 2018-09-30 DIAGNOSIS — J069 Acute upper respiratory infection, unspecified: Secondary | ICD-10-CM | POA: Diagnosis not present

## 2018-09-30 DIAGNOSIS — I10 Essential (primary) hypertension: Secondary | ICD-10-CM | POA: Diagnosis not present

## 2018-09-30 MED ORDER — HYDROCODONE-HOMATROPINE 5-1.5 MG/5ML PO SYRP: 5.0000 mL | ORAL_SOLUTION | Freq: Four times a day (QID) | ORAL | 0 refills | Status: DC | PRN

## 2018-09-30 MED ORDER — AZITHROMYCIN 250 MG PO TABS: ORAL_TABLET | ORAL | 1 refills | Status: DC

## 2018-09-30 MED ORDER — HYDROCODONE-HOMATROPINE 5-1.5 MG/5ML PO SYRP: 5.0000 mL | ORAL_SOLUTION | Freq: Four times a day (QID) | ORAL | 0 refills | Status: AC | PRN

## 2018-09-30 NOTE — Assessment & Plan Note (Signed)
stable overall by history and exam, recent data reviewed with pt, and pt to continue medical treatment as before,  to f/u any worsening symptoms or concerns  

## 2018-09-30 NOTE — Progress Notes (Signed)
Subjective:    Patient ID: Brandi Ferguson, female    DOB: [birth year], 72 y.o.   MRN: [id number]  HPI   Here with 5 days acute onset fever, facial pain, pressure, headache, general weakness and malaise, and greenish d/c, with mild ST and cough, but pt denies chest pain, wheezing, increased sob or doe, orthopnea, PND, increased LE swelling, palpitations, dizziness or syncope.  Pt denies new neurological symptoms such as new headache, or facial or extremity weakness or numbness   Pt denies polydipsia, polyuria.  Denies worsening depressive symptoms, suicidal ideation, or panic Past Medical History:  Diagnosis Date  . 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 ANXIETY DEPRESSION 02/06/2008  . Cough 09/27/2008  . DIVERTICULOSIS OF COLON 02/06/2008  . HYPERLIPIDEMIA 06/27/2008  . HYPERTENSION 02/06/2008  . HYPOTHYROIDISM 02/06/2008  . Irritable bowel syndrome 02/06/2008  . Lump or mass in breast 02/06/2008  . Overweight(278.02) 06/27/2008  . SINUSITIS- ACUTE-NOS 01/04/2010  . URI 09/04/2009   Past Surgical History:  Procedure Laterality Date  . BREAST EXCISIONAL BIOPSY Right 1984  . BREAST SURGERY  1984   Right lumpectomy- Benign  . CERVICAL CONE BIOPSY  1977  . LAPAROSCOPY  1972   Fertility work-up  . SKIN LESION EXCISION  2006   Left breast  . TONSILLECTOMY      reports that she has never smoked. She has never used smokeless tobacco. She reports that she does not drink alcohol or use drugs. family history includes Alcohol abuse in her father; Alzheimer's disease in her mother; Cancer in her sister; Coronary artery disease in her father; Diabetes in her father; Hyperlipidemia in her father; Hypertension in her father. No Known Allergies Current Outpatient Medications on File Prior to Visit  Medication Sig Dispense Refill  . ALPRAZolam (XANAX) 0.5 MG tablet Take 1 tablet (0.5 mg total) by mouth 3 (three) times daily as needed. 90 tablet 5  . aspirin 81 MG tablet Take 1 tablet (81 mg total) by  mouth daily. 30 tablet 11  . buPROPion (WELLBUTRIN) 75 MG tablet Take 1 tablet (75 mg total) by mouth 2 (two) times daily. 180 tablet 3  . famotidine (PEPCID) 40 MG tablet Take 1 tablet (40 mg total) by mouth daily. 90 tablet 3  . hydrochlorothiazide (HYDRODIURIL) 25 MG tablet Take 1 tablet (25 mg total) by mouth daily. 90 tablet 3  . hyoscyamine (ANASPAZ) 0.125 MG TBDP disintergrating tablet Place 1 tablet (0.125 mg total) under the tongue 2 (two) times daily as needed. 60 tablet 5  . levothyroxine (SYNTHROID, LEVOTHROID) 88 MCG tablet Take 1 tablet (88 mcg total) by mouth daily before breakfast. 90 tablet 3  . lovastatin (MEVACOR) 40 MG tablet Take 1 tablet (40 mg total) by mouth at bedtime. 90 tablet 3   No current facility-administered medications on file prior to visit.    Review of Systems  Constitutional: Negative for other unusual diaphoresis or sweats HENT: Negative for ear discharge or swelling Eyes: Negative for other worsening visual disturbances Respiratory: Negative for stridor or other swelling  Gastrointestinal: Negative for worsening distension or other blood Genitourinary: Negative for retention or other urinary change Musculoskeletal: Negative for other MSK pain or swelling Skin: Negative for color change or other new lesions Neurological: Negative for worsening tremors and other numbness  Psychiatric/Behavioral: Negative for worsening agitation or other fatigue All other system neg per pt    Objective:   Physical Exam BP 126/78   Pulse 84   Temp 98 F (36.7 C) (Oral)  Ht 5\' 2"  (1.575 m)   Wt 187 lb (84.8 kg)   SpO2 96%   BMI 34.20 kg/m  VS noted, mild ill Constitutional: Pt appears in NAD HENT: Head: NCAT.  Right Ear: External ear normal.  Left Ear: External ear normal.  Eyes: . Pupils are equal, round, and reactive to light. Conjunctivae and EOM are normal Bilat tm's with mild erythema.  Max sinus areas mild tender.  Pharynx with mild erythema, no exudate   Nose: without d/c or deformity Neck: Neck supple. Gross normal ROM Cardiovascular: Normal rate and regular rhythm.   Pulmonary/Chest: Effort normal and breath sounds without rales or wheezing.  Neurological: Pt is alert. At baseline orientation, motor grossly intact Skin: Skin is warm. No rashes, other new lesions, no LE edema Psychiatric: Pt behavior is normal without agitation , not depressed affect No other exam findings Lab Results  Component Value Date   WBC 8.5 07/02/2018   HGB 15.9 (H) 07/02/2018   HCT 45.6 07/02/2018   PLT 312.0 07/02/2018   GLUCOSE 91 07/02/2018   CHOL 125 07/02/2018   TRIG 98.0 07/02/2018   HDL 45.70 07/02/2018   LDLCALC 60 07/02/2018   ALT 15 07/02/2018   AST 25 07/02/2018   NA 143 07/02/2018   K 3.7 07/02/2018   CL 106 07/02/2018   CREATININE 0.99 07/02/2018   BUN 13 07/02/2018   CO2 27 07/02/2018   TSH 1.72 07/01/2017   INR 0.95 02/12/2010   HGBA1C 4.8 07/02/2018       Assessment & Plan:

## 2018-09-30 NOTE — Patient Instructions (Signed)
Please take all new medication as prescribed - the antibiotic, and cough medicine ° °Please continue all other medications as before, and refills have been done if requested. ° °Please have the pharmacy call with any other refills you may need. ° °Please continue your efforts at being more active, low cholesterol diet, and weight control. ° °Please keep your appointments with your specialists as you may have planned ° ° ° °

## 2018-09-30 NOTE — Assessment & Plan Note (Signed)
Mild to mod, for antibx course,  to f/u any worsening symptoms or concerns 

## 2018-11-18 DIAGNOSIS — B372 Candidiasis of skin and nail: Secondary | ICD-10-CM | POA: Diagnosis not present

## 2018-12-22 ENCOUNTER — Other Ambulatory Visit: Payer: Self-pay | Admitting: Internal Medicine

## 2018-12-22 NOTE — Telephone Encounter (Signed)
Control database checked last refill: 11/27/2018 LOV: CPE 06/22/2018 [id number]

## 2019-02-18 ENCOUNTER — Other Ambulatory Visit: Payer: Self-pay | Admitting: Internal Medicine

## 2019-05-12 DIAGNOSIS — Z87898 Personal history of other specified conditions: Secondary | ICD-10-CM | POA: Diagnosis not present

## 2019-05-19 ENCOUNTER — Other Ambulatory Visit: Payer: Self-pay | Admitting: Internal Medicine

## 2019-05-20 NOTE — Telephone Encounter (Signed)
Control database checked last refill: 04/20/2019 90 tabs LOV:07/02/2018 cpe, 09/30/2018 W/Dr. Jenny Reichmann NOV:07/06/2019

## 2019-06-04 ENCOUNTER — Other Ambulatory Visit: Payer: Self-pay | Admitting: Internal Medicine

## 2019-06-10 DIAGNOSIS — H02055 Trichiasis without entropian left lower eyelid: Secondary | ICD-10-CM | POA: Diagnosis not present

## 2019-06-10 DIAGNOSIS — H2513 Age-related nuclear cataract, bilateral: Secondary | ICD-10-CM | POA: Diagnosis not present

## 2019-07-06 ENCOUNTER — Other Ambulatory Visit (INDEPENDENT_AMBULATORY_CARE_PROVIDER_SITE_OTHER): Payer: Medicare Other

## 2019-07-06 ENCOUNTER — Ambulatory Visit (INDEPENDENT_AMBULATORY_CARE_PROVIDER_SITE_OTHER): Payer: Medicare Other | Admitting: Internal Medicine

## 2019-07-06 ENCOUNTER — Other Ambulatory Visit: Payer: Self-pay

## 2019-07-06 ENCOUNTER — Encounter: Payer: Self-pay | Admitting: Internal Medicine

## 2019-07-06 VITALS — BP 110/70 | HR 77 | Temp 97.7°F | Ht 62.0 in | Wt 184.0 lb

## 2019-07-06 DIAGNOSIS — Z23 Encounter for immunization: Secondary | ICD-10-CM

## 2019-07-06 DIAGNOSIS — F324 Major depressive disorder, single episode, in partial remission: Secondary | ICD-10-CM

## 2019-07-06 DIAGNOSIS — Z0000 Encounter for general adult medical examination without abnormal findings: Secondary | ICD-10-CM

## 2019-07-06 DIAGNOSIS — I10 Essential (primary) hypertension: Secondary | ICD-10-CM | POA: Diagnosis not present

## 2019-07-06 DIAGNOSIS — E782 Mixed hyperlipidemia: Secondary | ICD-10-CM

## 2019-07-06 DIAGNOSIS — Z7189 Other specified counseling: Secondary | ICD-10-CM

## 2019-07-06 DIAGNOSIS — E039 Hypothyroidism, unspecified: Secondary | ICD-10-CM

## 2019-07-06 LAB — COMPREHENSIVE METABOLIC PANEL
ALT: 18 U/L (ref 0–35)
AST: 24 U/L (ref 0–37)
Albumin: 4.1 g/dL (ref 3.5–5.2)
Alkaline Phosphatase: 63 U/L (ref 39–117)
BUN: 12 mg/dL (ref 6–23)
CO2: 29 mEq/L (ref 19–32)
Calcium: 10.1 mg/dL (ref 8.4–10.5)
Chloride: 103 mEq/L (ref 96–112)
Creatinine, Ser: 1.02 mg/dL (ref 0.40–1.20)
GFR: 53.44 mL/min — ABNORMAL LOW (ref >60.00)
Glucose, Bld: 90 mg/dL (ref 70–99)
Potassium: 3.4 mEq/L — ABNORMAL LOW (ref 3.5–5.1)
Sodium: 142 mEq/L (ref 135–145)
Total Bilirubin: 0.7 mg/dL (ref 0.2–1.2)
Total Protein: 7.4 g/dL (ref 6.0–8.3)

## 2019-07-06 LAB — T4, FREE: Free T4: 1.13 ng/dL (ref 0.60–1.60)

## 2019-07-06 LAB — CBC
HCT: 45.6 % (ref 36.0–46.0)
Hemoglobin: 16 g/dL — ABNORMAL HIGH (ref 12.0–15.0)
MCHC: 35.1 g/dL (ref 30.0–36.0)
MCV: 89.2 fl (ref 78.0–100.0)
Platelets: 292 10*3/uL (ref 150.0–400.0)
RBC: 5.11 Mil/uL (ref 3.87–5.11)
RDW: 14.1 % (ref 11.5–15.5)
WBC: 9.3 10*3/uL (ref 4.0–10.5)

## 2019-07-06 LAB — LIPID PANEL
Cholesterol: 127 mg/dL (ref 0–200)
HDL: 47.3 mg/dL (ref >39.00)
LDL Cholesterol: 58 mg/dL (ref 0–99)
NonHDL: 79.2
Total CHOL/HDL Ratio: 3
Triglycerides: 108 mg/dL (ref 0.0–149.0)
VLDL: 21.6 mg/dL (ref 0.0–40.0)

## 2019-07-06 LAB — HEMOGLOBIN A1C: Hgb A1c MFr Bld: 4.9 % (ref 4.6–6.5)

## 2019-07-06 LAB — TSH: TSH: 0.23 u[IU]/mL — ABNORMAL LOW (ref 0.35–4.50)

## 2019-07-06 NOTE — Assessment & Plan Note (Signed)
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 Informed she is medium risk for complications with [id number] and is at risk to get infection with substitute teaching. She is able to decide if she wishes to risk this or not but this activity would increase her risk of getting covid-19.

## 2019-07-06 NOTE — Assessment & Plan Note (Signed)
Stable with wellbutrin and xanax TID. Checked  narcotic database and no early fills or inappropriate fills. Reminded about risk and she is aware and wishes to continue with therapy.

## 2019-07-06 NOTE — Assessment & Plan Note (Signed)
BP at goal on HCTZ and checking CMP and adjust as needed.

## 2019-07-06 NOTE — Assessment & Plan Note (Signed)
Checking TSH and free T4 and adjust synthroid as needed.

## 2019-07-06 NOTE — Patient Instructions (Signed)
Health Maintenance, Female Adopting a healthy lifestyle and getting preventive care are important in promoting health and wellness. Ask your health care provider about:  The right schedule for you to have regular tests and exams.  Things you can do on your own to prevent diseases and keep yourself healthy. What should I know about diet, weight, and exercise? Eat a healthy diet   Eat a diet that includes plenty of vegetables, fruits, low-fat dairy products, and lean protein.  Do not eat a lot of foods that are high in solid fats, added sugars, or sodium. Maintain a healthy weight Body mass index (BMI) is used to identify weight problems. It estimates body fat based on height and weight. Your health care provider can help determine your BMI and help you achieve or maintain a healthy weight. Get regular exercise Get regular exercise. This is one of the most important things you can do for your health. Most adults should:  Exercise for at least 150 minutes each week. The exercise should increase your heart rate and make you sweat (moderate-intensity exercise).  Do strengthening exercises at least twice a week. This is in addition to the moderate-intensity exercise.  Spend less time sitting. Even light physical activity can be beneficial. Watch cholesterol and blood lipids Have your blood tested for lipids and cholesterol at 71 years of age, then have this test every 5 years. Have your cholesterol levels checked more often if:  Your lipid or cholesterol levels are high.  You are older than 71 years of age.  You are at high risk for heart disease. What should I know about cancer screening? Depending on your health history and family history, you may need to have cancer screening at various ages. This may include screening for:  Breast cancer.  Cervical cancer.  Colorectal cancer.  Skin cancer.  Lung cancer. What should I know about heart disease, diabetes, and high blood  pressure? Blood pressure and heart disease  High blood pressure causes heart disease and increases the risk of stroke. This is more likely to develop in people who have high blood pressure readings, are of African descent, or are overweight.  Have your blood pressure checked: ? Every 3-5 years if you are 18-39 years of age. ? Every year if you are 40 years old or older. Diabetes Have regular diabetes screenings. This checks your fasting blood sugar level. Have the screening done:  Once every three years after age 40 if you are at a normal weight and have a low risk for diabetes.  More often and at a younger age if you are overweight or have a high risk for diabetes. What should I know about preventing infection? Hepatitis B If you have a higher risk for hepatitis B, you should be screened for this virus. Talk with your health care provider to find out if you are at risk for hepatitis B infection. Hepatitis C Testing is recommended for:  Everyone born from 1945 through 1965.  Anyone with known risk factors for hepatitis C. Sexually transmitted infections (STIs)  Get screened for STIs, including gonorrhea and chlamydia, if: ? You are sexually active and are younger than 71 years of age. ? You are older than 71 years of age and your health care provider tells you that you are at risk for this type of infection. ? Your sexual activity has changed since you were last screened, and you are at increased risk for chlamydia or gonorrhea. Ask your health care provider if   you are at risk.  Ask your health care provider about whether you are at high risk for HIV. Your health care provider may recommend a prescription medicine to help prevent HIV infection. If you choose to take medicine to prevent HIV, you should first get tested for HIV. You should then be tested every 3 months for as long as you are taking the medicine. Pregnancy  If you are about to stop having your period (premenopausal) and  you may become pregnant, seek counseling before you get pregnant.  Take 400 to 800 micrograms (mcg) of folic acid every day if you become pregnant.  Ask for birth control (contraception) if you want to prevent pregnancy. Osteoporosis and menopause Osteoporosis is a disease in which the bones lose minerals and strength with aging. This can result in bone fractures. If you are 65 years old or older, or if you are at risk for osteoporosis and fractures, ask your health care provider if you should:  Be screened for bone loss.  Take a calcium or vitamin D supplement to lower your risk of fractures.  Be given hormone replacement therapy (HRT) to treat symptoms of menopause. Follow these instructions at home: Lifestyle  Do not use any products that contain nicotine or tobacco, such as cigarettes, e-cigarettes, and chewing tobacco. If you need help quitting, ask your health care provider.  Do not use street drugs.  Do not share needles.  Ask your health care provider for help if you need support or information about quitting drugs. Alcohol use  Do not drink alcohol if: ? Your health care provider tells you not to drink. ? You are pregnant, may be pregnant, or are planning to become pregnant.  If you drink alcohol: ? Limit how much you use to 0-1 drink a day. ? Limit intake if you are breastfeeding.  Be aware of how much alcohol is in your drink. In the [place] one drink equals one 12 oz bottle of beer (355 mL), one 5 oz glass of wine (148 mL), or one 1 oz glass of hard liquor (44 mL). General instructions  Schedule regular health, dental, and eye exams.  Stay current with your vaccines.  Tell your health care provider if: ? You often feel depressed. ? You have ever been abused or do not feel safe at home. Summary  Adopting a healthy lifestyle and getting preventive care are important in promoting health and wellness.  Follow your health care provider's instructions about healthy  diet, exercising, and getting tested or screened for diseases.  Follow your health care provider's instructions on monitoring your cholesterol and blood pressure. This information is not intended to replace advice given to you by your health care provider. Make sure you discuss any questions you have with your health care provider. Document Released: 04/15/2011 Document Revised: 09/23/2018 Document Reviewed: 09/23/2018 Elsevier Patient Education  2020 Elsevier Inc.  

## 2019-07-06 NOTE — Assessment & Plan Note (Signed)
Checking lipid panel and adjust lovastatin as needed.

## 2019-07-06 NOTE — Assessment & Plan Note (Signed)
Flu shot given. Pneumonia declines. Shingrix declines. Tetanus declines. Colonoscopy due 2026. Mammogram due 2021, pap smear gets with gyn although aged out and dexa declines further last 2010. Counseled about sun safety and mole surveillance. Counseled about the dangers of distracted driving. Given 10 year screening recommendations.

## 2019-07-06 NOTE — Progress Notes (Signed)
Subjective:   Patient ID: Brandi Ferguson, female    DOB: 1946-04-13, 73 y.o.   MRN: [id number]  HPI Here for medicare wellness and physical, no new complaints. Please see A/P for status and treatment of chronic medical problems.   Diet: heart healthy Physical activity: sedentary Depression/mood screen: negative Hearing: intact to whispered voice with hearing aids Visual acuity: grossly normal, performs annual eye exam  ADLs: capable Fall risk: none Home safety: good Cognitive evaluation: intact to orientation, naming, recall and repetition EOL planning: adv directives discussed    Office Visit from 07/06/2019 in [facility]  PHQ-2 Total Score  1        Office Visit from 07/06/2019 in [facility]  PHQ-9 Total Score  3     I have personally reviewed and have noted 1. The patient's medical and social history - reviewed today no changes 2. Their use of alcohol, tobacco or illicit drugs 3. Their current medications and supplements 4. The patient's functional ability including ADL's, fall risks, home safety risks and hearing or visual impairment. 5. Diet and physical activities 6. Evidence for depression or mood disorders 7. Care team reviewed and updated  Patient Care Team: Hoyt Koch, MD as PCP - General (Internal Medicine) Druscilla Brownie, MD as Consulting Physician (Dermatology) Earlie Server, MD (Orthopedic Surgery) Gus Height, MD (Inactive) as Consulting Physician (Obstetrics and Gynecology) Past Medical History:  Diagnosis Date  . 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 ANXIETY DEPRESSION 02/06/2008  . Cough 09/27/2008  . DIVERTICULOSIS OF COLON 02/06/2008  . HYPERLIPIDEMIA 06/27/2008  . HYPERTENSION 02/06/2008  . HYPOTHYROIDISM 02/06/2008  . Irritable bowel syndrome 02/06/2008  . Lump or mass in breast 02/06/2008  . Overweight(278.02) 06/27/2008  . SINUSITIS- ACUTE-NOS 01/04/2010  . URI 09/04/2009    Past Surgical History:  Procedure Laterality Date  . BREAST EXCISIONAL BIOPSY Right 1984  . BREAST SURGERY  1984   Right lumpectomy- Benign  . CERVICAL CONE BIOPSY  1977  . LAPAROSCOPY  1972   Fertility work-up  . SKIN LESION EXCISION  2006   Left breast  . TONSILLECTOMY     Family History  Problem Relation Age of Onset  . Alzheimer's disease Mother   . Alcohol abuse Father   . Diabetes Father   . Hyperlipidemia Father   . Hypertension Father   . Coronary artery disease Father   . Cancer Sister     Review of Systems  Constitutional: Negative.   HENT: Negative.   Eyes: Negative.   Respiratory: Negative for cough, chest tightness and shortness of breath.   Cardiovascular: Negative for chest pain, palpitations and leg swelling.  Gastrointestinal: Negative for abdominal distention, abdominal pain, constipation, diarrhea, nausea and vomiting.  Musculoskeletal: Negative.   Skin: Negative.   Neurological: Negative.   Psychiatric/Behavioral: Negative.     Objective:  Physical Exam Constitutional:      Appearance: She is well-developed. She is obese.  HENT:     Head: Normocephalic and atraumatic.  Neck:     Musculoskeletal: Normal range of motion.  Cardiovascular:     Rate and Rhythm: Normal rate and regular rhythm.  Pulmonary:     Effort: Pulmonary effort is normal. No respiratory distress.     Breath sounds: Normal breath sounds. No wheezing or rales.  Abdominal:     General: Bowel sounds are normal. There is no distension.     Palpations: Abdomen is soft.     Tenderness: There is  no abdominal tenderness. There is no rebound.  Skin:    General: Skin is warm and dry.  Neurological:     Mental Status: She is alert and oriented to person, place, and time.     Coordination: Coordination normal.     Vitals:   07/06/19 0854  BP: 110/70  Pulse: 77  Temp: 97.7 F (36.5 C)  TempSrc: Oral  SpO2: 98%  Weight: 184 lb (83.5 kg)  Height: 5\' 2"  (1.575 m)     Assessment & Plan:  Flu shot given at visit

## 2019-07-07 ENCOUNTER — Other Ambulatory Visit: Payer: Self-pay | Admitting: Internal Medicine

## 2019-07-07 NOTE — Telephone Encounter (Signed)
Pt was seen yesterday and needs a refill on bupropion , hctz, levothyroxine, lovastatin, famotide,hyoscyamine #90 w/refills and alprazolam #90 for 1 month supply w/refills. [facility] [address]

## 2019-07-08 DIAGNOSIS — L814 Other melanin hyperpigmentation: Secondary | ICD-10-CM | POA: Diagnosis not present

## 2019-07-08 DIAGNOSIS — D1801 Hemangioma of skin and subcutaneous tissue: Secondary | ICD-10-CM | POA: Diagnosis not present

## 2019-07-08 DIAGNOSIS — L821 Other seborrheic keratosis: Secondary | ICD-10-CM | POA: Diagnosis not present

## 2019-07-08 DIAGNOSIS — D2239 Melanocytic nevi of other parts of face: Secondary | ICD-10-CM | POA: Diagnosis not present

## 2019-07-08 DIAGNOSIS — D225 Melanocytic nevi of trunk: Secondary | ICD-10-CM | POA: Diagnosis not present

## 2019-07-08 NOTE — Telephone Encounter (Signed)
These were all sent in yesterday for patient.

## 2019-07-09 DIAGNOSIS — H10503 Unspecified blepharoconjunctivitis, bilateral: Secondary | ICD-10-CM | POA: Diagnosis not present

## 2019-07-09 DIAGNOSIS — H16143 Punctate keratitis, bilateral: Secondary | ICD-10-CM | POA: Diagnosis not present

## 2019-07-09 DIAGNOSIS — H01025 Squamous blepharitis left lower eyelid: Secondary | ICD-10-CM | POA: Diagnosis not present

## 2019-07-09 DIAGNOSIS — H01022 Squamous blepharitis right lower eyelid: Secondary | ICD-10-CM | POA: Diagnosis not present

## 2019-07-18 ENCOUNTER — Encounter: Payer: Self-pay | Admitting: Internal Medicine

## 2019-07-19 MED ORDER — BUPROPION HCL 75 MG PO TABS: 75.0000 mg | ORAL_TABLET | Freq: Two times a day (BID) | ORAL | 3 refills | Status: DC

## 2019-07-19 MED ORDER — HYDROCHLOROTHIAZIDE 25 MG PO TABS: 25.0000 mg | ORAL_TABLET | Freq: Every day | ORAL | 3 refills | Status: DC

## 2019-07-19 MED ORDER — LOVASTATIN 40 MG PO TABS: 40.0000 mg | ORAL_TABLET | Freq: Every day | ORAL | 3 refills | Status: DC

## 2019-07-19 MED ORDER — FAMOTIDINE 40 MG PO TABS: 40.0000 mg | ORAL_TABLET | Freq: Every day | ORAL | 3 refills | Status: DC

## 2019-07-19 MED ORDER — LEVOTHYROXINE SODIUM 88 MCG PO TABS: 88.0000 ug | ORAL_TABLET | Freq: Every day | ORAL | 3 refills | Status: DC

## 2019-07-26 ENCOUNTER — Other Ambulatory Visit: Payer: Self-pay | Admitting: Internal Medicine

## 2019-07-26 DIAGNOSIS — Z1231 Encounter for screening mammogram for malignant neoplasm of breast: Secondary | ICD-10-CM

## 2019-08-06 DIAGNOSIS — H02055 Trichiasis without entropian left lower eyelid: Secondary | ICD-10-CM | POA: Diagnosis not present

## 2019-08-06 DIAGNOSIS — H01025 Squamous blepharitis left lower eyelid: Secondary | ICD-10-CM | POA: Diagnosis not present

## 2019-08-17 DIAGNOSIS — H02055 Trichiasis without entropian left lower eyelid: Secondary | ICD-10-CM | POA: Diagnosis not present

## 2019-08-19 ENCOUNTER — Other Ambulatory Visit: Payer: Self-pay | Admitting: Internal Medicine

## 2019-08-19 NOTE — Telephone Encounter (Signed)
Control database checked last refill: 07/22/2019  90 tabs LOV: 07/06/2019 cpe NOV: none

## 2019-09-03 DIAGNOSIS — H029 Unspecified disorder of eyelid: Secondary | ICD-10-CM | POA: Diagnosis not present

## 2019-09-13 ENCOUNTER — Ambulatory Visit
Admission: RE | Admit: 2019-09-13 | Discharge: 2019-09-13 | Disposition: A | Discharge status: Home / Self Care | Payer: Medicare Other | Source: Ambulatory Visit | Attending: Internal Medicine | Admitting: Internal Medicine

## 2019-09-13 ENCOUNTER — Other Ambulatory Visit: Payer: Self-pay

## 2019-09-13 DIAGNOSIS — Z1231 Encounter for screening mammogram for malignant neoplasm of breast: Secondary | ICD-10-CM | POA: Diagnosis not present

## 2019-09-21 DIAGNOSIS — H029 Unspecified disorder of eyelid: Secondary | ICD-10-CM | POA: Diagnosis not present

## 2019-11-21 ENCOUNTER — Other Ambulatory Visit: Payer: Self-pay | Admitting: Internal Medicine

## 2019-11-22 NOTE — Telephone Encounter (Signed)
Filled 10/22/2019 Alprazolam 0.5 Mg Tablet 90#  Last ov 07/06/19 Next ov n/s

## 2019-11-28 ENCOUNTER — Ambulatory Visit: Payer: Medicare Other | Attending: Internal Medicine

## 2019-11-28 DIAGNOSIS — Z23 Encounter for immunization: Secondary | ICD-10-CM

## 2019-11-28 NOTE — Progress Notes (Signed)
   Covid-19 Vaccination Clinic  Name:  Brandi Ferguson    MRN: [id number] DOB: [birth year]  11/28/2019  [name] was observed post Covid-19 immunization for 15 minutes without incidence. She was provided with Vaccine Information Sheet and instruction to access the V-Safe system.   [name] was instructed to call 911 with any severe reactions post vaccine: Marland Kitchen Difficulty breathing  . Swelling of your face and throat  . A fast heartbeat  . A bad rash all over your body  . Dizziness and weakness    Immunizations Administered    Name Date Dose VIS Date Route   Pfizer COVID-19 Vaccine 11/28/2019 10:14 AM 0.3 mL 09/24/2019 Intramuscular   Manufacturer: [place]   Lot: [id number]   [place]: [id number]

## 2019-11-30 DIAGNOSIS — H0289 Other specified disorders of eyelid: Secondary | ICD-10-CM | POA: Diagnosis not present

## 2019-12-21 ENCOUNTER — Ambulatory Visit: Payer: Medicare Other | Attending: Internal Medicine

## 2019-12-21 DIAGNOSIS — Z23 Encounter for immunization: Secondary | ICD-10-CM | POA: Insufficient documentation

## 2019-12-21 NOTE — Progress Notes (Signed)
   Covid-19 Vaccination Clinic  Name:  Brandi Ferguson    MRN: [id number] DOB: 06-27-1946  12/21/2019  Brandi Ferguson was observed post Covid-19 immunization for 15 minutes without incident. She was provided with Vaccine Information Sheet and instruction to access the V-Safe system.   Brandi Ferguson was instructed to call 911 with any severe reactions post vaccine: Marland Kitchen Difficulty breathing  . Swelling of face and throat  . A fast heartbeat  . A bad rash all over body  . Dizziness and weakness   Immunizations Administered    Name Date Dose VIS Date Route   Pfizer COVID-19 Vaccine 12/21/2019 12:03 PM 0.3 mL 09/24/2019 Intramuscular   Manufacturer: [place]   Lot: [id number]   [place]: [id number]

## 2019-12-27 ENCOUNTER — Encounter: Payer: Self-pay | Admitting: Internal Medicine

## 2020-02-22 ENCOUNTER — Other Ambulatory Visit: Payer: Self-pay | Admitting: Internal Medicine

## 2020-02-22 NOTE — Telephone Encounter (Signed)
Check [place] registry last filled 01/24/2020.Marland Kitchen/LMB

## 2020-04-28 DIAGNOSIS — D2262 Melanocytic nevi of left upper limb, including shoulder: Secondary | ICD-10-CM | POA: Diagnosis not present

## 2020-04-28 DIAGNOSIS — D220 Melanocytic nevi of lip: Secondary | ICD-10-CM | POA: Diagnosis not present

## 2020-04-28 DIAGNOSIS — L821 Other seborrheic keratosis: Secondary | ICD-10-CM | POA: Diagnosis not present

## 2020-04-28 DIAGNOSIS — D2239 Melanocytic nevi of other parts of face: Secondary | ICD-10-CM | POA: Diagnosis not present

## 2020-05-17 DIAGNOSIS — Z01419 Encounter for gynecological examination (general) (routine) without abnormal findings: Secondary | ICD-10-CM | POA: Diagnosis not present

## 2020-06-12 DIAGNOSIS — H524 Presbyopia: Secondary | ICD-10-CM | POA: Diagnosis not present

## 2020-07-12 DIAGNOSIS — Z1159 Encounter for screening for other viral diseases: Secondary | ICD-10-CM | POA: Diagnosis not present

## 2020-07-14 DIAGNOSIS — D124 Benign neoplasm of descending colon: Secondary | ICD-10-CM | POA: Diagnosis not present

## 2020-07-14 DIAGNOSIS — Q438 Other specified congenital malformations of intestine: Secondary | ICD-10-CM | POA: Diagnosis not present

## 2020-07-14 DIAGNOSIS — D123 Benign neoplasm of transverse colon: Secondary | ICD-10-CM | POA: Diagnosis not present

## 2020-07-14 DIAGNOSIS — Z86010 Personal history of colonic polyps: Secondary | ICD-10-CM | POA: Diagnosis not present

## 2020-07-14 DIAGNOSIS — K5730 Diverticulosis of large intestine without perforation or abscess without bleeding: Secondary | ICD-10-CM | POA: Diagnosis not present

## 2020-07-14 DIAGNOSIS — K6389 Other specified diseases of intestine: Secondary | ICD-10-CM | POA: Diagnosis not present

## 2020-07-14 DIAGNOSIS — Z800 Family history of malignant neoplasm of digestive organs: Secondary | ICD-10-CM | POA: Diagnosis not present

## 2020-07-17 ENCOUNTER — Encounter: Payer: BC Managed Care – PPO | Admitting: Internal Medicine

## 2020-07-18 DIAGNOSIS — D123 Benign neoplasm of transverse colon: Secondary | ICD-10-CM | POA: Diagnosis not present

## 2020-07-18 DIAGNOSIS — D124 Benign neoplasm of descending colon: Secondary | ICD-10-CM | POA: Diagnosis not present

## 2020-07-20 ENCOUNTER — Other Ambulatory Visit: Payer: Self-pay | Admitting: Internal Medicine

## 2020-07-20 DIAGNOSIS — Z1231 Encounter for screening mammogram for malignant neoplasm of breast: Secondary | ICD-10-CM

## 2020-08-01 DIAGNOSIS — H0289 Other specified disorders of eyelid: Secondary | ICD-10-CM | POA: Diagnosis not present

## 2020-08-07 ENCOUNTER — Other Ambulatory Visit: Payer: Self-pay | Admitting: Internal Medicine

## 2020-08-29 ENCOUNTER — Encounter: Payer: Self-pay | Admitting: Internal Medicine

## 2020-08-29 ENCOUNTER — Ambulatory Visit (INDEPENDENT_AMBULATORY_CARE_PROVIDER_SITE_OTHER): Payer: Medicare Other | Admitting: Internal Medicine

## 2020-08-29 ENCOUNTER — Other Ambulatory Visit: Payer: Self-pay

## 2020-08-29 VITALS — BP 138/80 | HR 88 | Temp 98.4°F | Ht 62.0 in | Wt 179.0 lb

## 2020-08-29 DIAGNOSIS — Z23 Encounter for immunization: Secondary | ICD-10-CM

## 2020-08-29 DIAGNOSIS — E782 Mixed hyperlipidemia: Secondary | ICD-10-CM

## 2020-08-29 DIAGNOSIS — E039 Hypothyroidism, unspecified: Secondary | ICD-10-CM

## 2020-08-29 DIAGNOSIS — I10 Essential (primary) hypertension: Secondary | ICD-10-CM

## 2020-08-29 DIAGNOSIS — Z0000 Encounter for general adult medical examination without abnormal findings: Secondary | ICD-10-CM

## 2020-08-29 DIAGNOSIS — F324 Major depressive disorder, single episode, in partial remission: Secondary | ICD-10-CM

## 2020-08-29 LAB — COMPREHENSIVE METABOLIC PANEL
ALT: 15 U/L (ref 0–35)
AST: 26 U/L (ref 0–37)
Albumin: 3.9 g/dL (ref 3.5–5.2)
Alkaline Phosphatase: 74 U/L (ref 39–117)
BUN: 14 mg/dL (ref 6–23)
CO2: 28 mEq/L (ref 19–32)
Calcium: 9.7 mg/dL (ref 8.4–10.5)
Chloride: 104 mEq/L (ref 96–112)
Creatinine, Ser: 1.03 mg/dL (ref 0.40–1.20)
GFR: 54.5 mL/min — ABNORMAL LOW (ref >60.00)
Glucose, Bld: 88 mg/dL (ref 70–99)
Potassium: 3.5 mEq/L (ref 3.5–5.1)
Sodium: 140 mEq/L (ref 135–145)
Total Bilirubin: 0.6 mg/dL (ref 0.2–1.2)
Total Protein: 7.6 g/dL (ref 6.0–8.3)

## 2020-08-29 LAB — CBC
HCT: 46.3 % — ABNORMAL HIGH (ref 36.0–46.0)
Hemoglobin: 16 g/dL — ABNORMAL HIGH (ref 12.0–15.0)
MCHC: 34.5 g/dL (ref 30.0–36.0)
MCV: 87.2 fl (ref 78.0–100.0)
Platelets: 376 10*3/uL (ref 150.0–400.0)
RBC: 5.31 Mil/uL — ABNORMAL HIGH (ref 3.87–5.11)
RDW: 14.3 % (ref 11.5–15.5)
WBC: 8.3 10*3/uL (ref 4.0–10.5)

## 2020-08-29 LAB — LIPID PANEL
Cholesterol: 127 mg/dL (ref 0–200)
HDL: 47.2 mg/dL (ref >39.00)
LDL Cholesterol: 58 mg/dL (ref 0–99)
NonHDL: 79.56
Total CHOL/HDL Ratio: 3
Triglycerides: 108 mg/dL (ref 0.0–149.0)
VLDL: 21.6 mg/dL (ref 0.0–40.0)

## 2020-08-29 LAB — TSH: TSH: 1.04 u[IU]/mL (ref 0.35–4.50)

## 2020-08-29 LAB — T4, FREE: Free T4: 1.07 ng/dL (ref 0.60–1.60)

## 2020-08-29 MED ORDER — HYOSCYAMINE SULFATE 0.125 MG PO TBDP
0.1250 mg | ORAL_TABLET | Freq: Four times a day (QID) | ORAL | 5 refills | Status: DC | PRN
Start: 2020-08-29 — End: 2021-09-04

## 2020-08-29 MED ORDER — ALPRAZOLAM 0.5 MG PO TABS
0.5000 mg | ORAL_TABLET | Freq: Three times a day (TID) | ORAL | 5 refills | Status: DC | PRN
Start: 2020-08-29 — End: 2021-03-05

## 2020-08-29 MED ORDER — BUPROPION HCL 75 MG PO TABS
75.0000 mg | ORAL_TABLET | Freq: Two times a day (BID) | ORAL | 3 refills | Status: DC
Start: 2020-08-29 — End: 2021-08-31

## 2020-08-29 NOTE — Assessment & Plan Note (Signed)
Checking TSH and free T4 and adjust synthroid 88mcg daily as needed. 

## 2020-08-29 NOTE — Assessment & Plan Note (Signed)
Flu shot given at visit. Covid-19 2 shots done booster recommended and education given. Pneumonia declines today. Shingrix counseled due. Tetanus due declines. Colonoscopy done Oct 2021 and due 2026. Mammogram due 2022, pap smear aged out and dexa with gyn due 2023. Counseled about sun safety and mole surveillance. Counseled about the dangers of distracted driving. Given 10 year screening recommendations.

## 2020-08-29 NOTE — Progress Notes (Signed)
Subjective:   Patient ID: Brandi Ferguson, female    DOB: 1946-08-07, 74 y.o.   MRN: [id number]  HPI Here for medicare wellness and physical, no new complaints. Please see A/P for status and treatment of chronic medical problems.   Diet: heart healthy Physical activity: sedentary Depression/mood screen: negative Hearing: intact to whispered voice Visual acuity: grossly normal. Undergoing workup currently for possible mass lower eyelid left eye, performs annual eye exam  ADLs: capable Fall risk: none Home safety: good Cognitive evaluation: intact to orientation, naming, recall and repetition EOL planning: adv directives discussed    Office Visit from 08/29/2020 in [facility] at [facility]  PHQ-2 Total Score 0        Office Visit from 08/29/2020 in [facility] at [facility]  PHQ-9 Total Score 0      I have personally reviewed and have noted 1. The patient's medical and social history - reviewed today no changes 2. Their use of alcohol, tobacco or illicit drugs 3. Their current medications and supplements 4. The patient's functional ability including ADL's, fall risks, home safety risks and hearing or visual impairment. 5. Diet and physical activities 6. Evidence for depression or mood disorders 7. Care team reviewed and updated  Patient Care Team: Hoyt Koch, MD as PCP - General (Internal Medicine) Druscilla Brownie, MD as Consulting Physician (Dermatology) Earlie Server, MD (Orthopedic Surgery) Gus Height, MD (Inactive) as Consulting Physician (Obstetrics and Gynecology) Past Medical History:  Diagnosis Date  . 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 ANXIETY DEPRESSION 02/06/2008  . Cough 09/27/2008  . DIVERTICULOSIS OF COLON 02/06/2008  . HYPERLIPIDEMIA 06/27/2008  . HYPERTENSION 02/06/2008  . HYPOTHYROIDISM 02/06/2008  . Irritable bowel syndrome 02/06/2008  . Lump or mass in breast 02/06/2008  . Overweight(278.02) 06/27/2008  .  SINUSITIS- ACUTE-NOS 01/04/2010  . URI 09/04/2009   Past Surgical History:  Procedure Laterality Date  . BREAST EXCISIONAL BIOPSY Right 1984  . BREAST SURGERY  1984   Right lumpectomy- Benign  . CERVICAL CONE BIOPSY  1977  . LAPAROSCOPY  1972   Fertility work-up  . SKIN LESION EXCISION  2006   Left breast  . TONSILLECTOMY     Family History  Problem Relation Age of Onset  . Alzheimer's disease Mother   . Alcohol abuse Father   . Diabetes Father   . Hyperlipidemia Father   . Hypertension Father   . Coronary artery disease Father   . Cancer Sister    Review of Systems  Constitutional: Negative.   HENT: Negative.   Eyes: Negative.   Respiratory: Negative for cough, chest tightness and shortness of breath.   Cardiovascular: Negative for chest pain, palpitations and leg swelling.  Gastrointestinal: Negative for abdominal distention, abdominal pain, constipation, diarrhea, nausea and vomiting.  Musculoskeletal: Negative.   Skin: Negative.   Neurological: Negative.   Psychiatric/Behavioral: Negative.     Objective:  Physical Exam Constitutional:      Appearance: She is well-developed.  HENT:     Head: Normocephalic and atraumatic.  Cardiovascular:     Rate and Rhythm: Normal rate and regular rhythm.  Pulmonary:     Effort: Pulmonary effort is normal. No respiratory distress.     Breath sounds: Normal breath sounds. No wheezing or rales.  Abdominal:     General: Bowel sounds are normal. There is no distension.     Palpations: Abdomen is soft.     Tenderness: There is no abdominal tenderness. There is no rebound.  Musculoskeletal:  Cervical back: Normal range of motion.  Skin:    General: Skin is warm and dry.  Neurological:     Mental Status: She is alert and oriented to person, place, and time.     Coordination: Coordination normal.     Vitals:   08/29/20 0801  BP: 138/80  Pulse: 88  Temp: 98.4 F (36.9 C)  TempSrc: Oral  SpO2: 97%  Weight: 179 lb  (81.2 kg)  Height: 5\' 2"  (1.575 m)    This visit occurred during the SARS-CoV-2 public health emergency.  Safety protocols were in place, including screening questions prior to the visit, additional usage of staff PPE, and extensive cleaning of exam room while observing appropriate contact time as indicated for disinfecting solutions.   Assessment & Plan:  Flu shot given at visit

## 2020-08-29 NOTE — Assessment & Plan Note (Signed)
BP at goal on hctz. Checking CMP and adjust as needed.  

## 2020-08-29 NOTE — Assessment & Plan Note (Signed)
Checking lipid panel and adjust as needed. Taking lovastatin 40 mg daily.

## 2020-08-29 NOTE — Assessment & Plan Note (Addendum)
She would like to decrease wellbutrin to 75 mg daily and we will try that. Refilled for BID in case change not successful. Using alprazolam TID also and refill given for this.

## 2020-08-29 NOTE — Patient Instructions (Signed)
Health Maintenance, Female Adopting a healthy lifestyle and getting preventive care are important in promoting health and wellness. Ask your health care provider about:  The right schedule for you to have regular tests and exams.  Things you can do on your own to prevent diseases and keep yourself healthy. What should I know about diet, weight, and exercise? Eat a healthy diet   Eat a diet that includes plenty of vegetables, fruits, low-fat dairy products, and lean protein.  Do not eat a lot of foods that are high in solid fats, added sugars, or sodium. Maintain a healthy weight Body mass index (BMI) is used to identify weight problems. It estimates body fat based on height and weight. Your health care provider can help determine your BMI and help you achieve or maintain a healthy weight. Get regular exercise Get regular exercise. This is one of the most important things you can do for your health. Most adults should:  Exercise for at least 150 minutes each week. The exercise should increase your heart rate and make you sweat (moderate-intensity exercise).  Do strengthening exercises at least twice a week. This is in addition to the moderate-intensity exercise.  Spend less time sitting. Even light physical activity can be beneficial. Watch cholesterol and blood lipids Have your blood tested for lipids and cholesterol at 72 years of age, then have this test every 5 years. Have your cholesterol levels checked more often if:  Your lipid or cholesterol levels are high.  You are older than 72 years of age.  You are at high risk for heart disease. What should I know about cancer screening? Depending on your health history and family history, you may need to have cancer screening at various ages. This may include screening for:  Breast cancer.  Cervical cancer.  Colorectal cancer.  Skin cancer.  Lung cancer. What should I know about heart disease, diabetes, and high blood  pressure? Blood pressure and heart disease  High blood pressure causes heart disease and increases the risk of stroke. This is more likely to develop in people who have high blood pressure readings, are of African descent, or are overweight.  Have your blood pressure checked: ? Every 3-5 years if you are 18-39 years of age. ? Every year if you are 40 years old or older. Diabetes Have regular diabetes screenings. This checks your fasting blood sugar level. Have the screening done:  Once every three years after age 40 if you are at a normal weight and have a low risk for diabetes.  More often and at a younger age if you are overweight or have a high risk for diabetes. What should I know about preventing infection? Hepatitis B If you have a higher risk for hepatitis B, you should be screened for this virus. Talk with your health care provider to find out if you are at risk for hepatitis B infection. Hepatitis C Testing is recommended for:  Everyone born from 1945 through 1965.  Anyone with known risk factors for hepatitis C. Sexually transmitted infections (STIs)  Get screened for STIs, including gonorrhea and chlamydia, if: ? You are sexually active and are younger than 72 years of age. ? You are older than 72 years of age and your health care provider tells you that you are at risk for this type of infection. ? Your sexual activity has changed since you were last screened, and you are at increased risk for chlamydia or gonorrhea. Ask your health care provider if   you are at risk.  Ask your health care provider about whether you are at high risk for HIV. Your health care provider may recommend a prescription medicine to help prevent HIV infection. If you choose to take medicine to prevent HIV, you should first get tested for HIV. You should then be tested every 3 months for as long as you are taking the medicine. Pregnancy  If you are about to stop having your period (premenopausal) and  you may become pregnant, seek counseling before you get pregnant.  Take 400 to 800 micrograms (mcg) of folic acid every day if you become pregnant.  Ask for birth control (contraception) if you want to prevent pregnancy. Osteoporosis and menopause Osteoporosis is a disease in which the bones lose minerals and strength with aging. This can result in bone fractures. If you are 65 years old or older, or if you are at risk for osteoporosis and fractures, ask your health care provider if you should:  Be screened for bone loss.  Take a calcium or vitamin D supplement to lower your risk of fractures.  Be given hormone replacement therapy (HRT) to treat symptoms of menopause. Follow these instructions at home: Lifestyle  Do not use any products that contain nicotine or tobacco, such as cigarettes, e-cigarettes, and chewing tobacco. If you need help quitting, ask your health care provider.  Do not use street drugs.  Do not share needles.  Ask your health care provider for help if you need support or information about quitting drugs. Alcohol use  Do not drink alcohol if: ? Your health care provider tells you not to drink. ? You are pregnant, may be pregnant, or are planning to become pregnant.  If you drink alcohol: ? Limit how much you use to 0-1 drink a day. ? Limit intake if you are breastfeeding.  Be aware of how much alcohol is in your drink. In the [place] one drink equals one 12 oz bottle of beer (355 mL), one 5 oz glass of wine (148 mL), or one 1 oz glass of hard liquor (44 mL). General instructions  Schedule regular health, dental, and eye exams.  Stay current with your vaccines.  Tell your health care provider if: ? You often feel depressed. ? You have ever been abused or do not feel safe at home. Summary  Adopting a healthy lifestyle and getting preventive care are important in promoting health and wellness.  Follow your health care provider's instructions about healthy  diet, exercising, and getting tested or screened for diseases.  Follow your health care provider's instructions on monitoring your cholesterol and blood pressure. This information is not intended to replace advice given to you by your health care provider. Make sure you discuss any questions you have with your health care provider. Document Revised: 09/23/2018 Document Reviewed: 09/23/2018 Elsevier Patient Education  2020 Elsevier Inc.  

## 2020-09-13 ENCOUNTER — Other Ambulatory Visit: Payer: Self-pay

## 2020-09-13 ENCOUNTER — Ambulatory Visit
Admission: RE | Admit: 2020-09-13 | Discharge: 2020-09-13 | Disposition: A | Discharge status: Home / Self Care | Payer: BC Managed Care – PPO | Source: Ambulatory Visit | Attending: Internal Medicine | Admitting: Internal Medicine

## 2020-09-13 DIAGNOSIS — Z1231 Encounter for screening mammogram for malignant neoplasm of breast: Secondary | ICD-10-CM | POA: Diagnosis not present

## 2020-09-20 DIAGNOSIS — H02055 Trichiasis without entropian left lower eyelid: Secondary | ICD-10-CM | POA: Diagnosis not present

## 2020-09-20 DIAGNOSIS — D485 Neoplasm of uncertain behavior of skin: Secondary | ICD-10-CM | POA: Diagnosis not present

## 2020-11-06 DIAGNOSIS — C441192 Basal cell carcinoma of skin of left lower eyelid, including canthus: Secondary | ICD-10-CM | POA: Diagnosis not present

## 2020-11-06 DIAGNOSIS — D485 Neoplasm of uncertain behavior of skin: Secondary | ICD-10-CM | POA: Diagnosis not present

## 2020-12-04 DIAGNOSIS — M79645 Pain in left finger(s): Secondary | ICD-10-CM | POA: Diagnosis not present

## 2020-12-19 DIAGNOSIS — C441192 Basal cell carcinoma of skin of left lower eyelid, including canthus: Secondary | ICD-10-CM | POA: Diagnosis not present

## 2021-01-04 DIAGNOSIS — H2513 Age-related nuclear cataract, bilateral: Secondary | ICD-10-CM | POA: Diagnosis not present

## 2021-01-04 DIAGNOSIS — H16143 Punctate keratitis, bilateral: Secondary | ICD-10-CM | POA: Diagnosis not present

## 2021-01-04 DIAGNOSIS — H01025 Squamous blepharitis left lower eyelid: Secondary | ICD-10-CM | POA: Diagnosis not present

## 2021-02-08 ENCOUNTER — Other Ambulatory Visit: Payer: Self-pay | Admitting: Internal Medicine

## 2021-02-14 ENCOUNTER — Telehealth: Payer: Self-pay | Admitting: Internal Medicine

## 2021-02-14 NOTE — Progress Notes (Signed)
  Chronic Care Management   Outreach Note  02/14/2021 Name: MARIELLE MANTIONE MRN: [id number] DOB: 05/11/1946  Referred by: Hoyt Koch, MD Reason for referral : No chief complaint on file.   An unsuccessful telephone outreach was attempted today. The patient was referred to the pharmacist for assistance with care management and care coordination.   Follow Up Plan:   Carley Perdue [employer] Scheduler

## 2021-02-14 NOTE — Progress Notes (Signed)
  Chronic Care Management   Note  02/14/2021 Name: Brandi Ferguson MRN: [id number] DOB: 1946-09-20  Brandi Ferguson is a 75 y.o. year old female who is a primary care patient of Hoyt Koch, MD. I reached out to [employer] by phone today in response to a referral sent by Ms. Brandi Ferguson's PCP, Hoyt Koch, MD.   [name] was given information about Chronic Care Management services today including:  1. CCM service includes personalized support from designated clinical staff supervised by her physician, including individualized plan of care and coordination with other care providers 2. 24/7 contact phone numbers for assistance for urgent and routine care needs. 3. Service will only be billed when office clinical staff spend 20 minutes or more in a month to coordinate care. 4. Only one practitioner may furnish and bill the service in a calendar month. 5. The patient may stop CCM services at any time (effective at the end of the month) by phone call to the office staff.   Patient wishes to consider information provided and/or speak with a member of the care team before deciding about enrollment in care management services.   Follow up plan:   Carley Perdue [employer] Scheduler

## 2021-03-02 ENCOUNTER — Other Ambulatory Visit: Payer: Self-pay | Admitting: Internal Medicine

## 2021-04-30 ENCOUNTER — Telehealth (INDEPENDENT_AMBULATORY_CARE_PROVIDER_SITE_OTHER): Payer: Medicare Other | Admitting: Family Medicine

## 2021-04-30 ENCOUNTER — Encounter: Payer: Self-pay | Admitting: Family Medicine

## 2021-04-30 DIAGNOSIS — J0141 Acute recurrent pansinusitis: Secondary | ICD-10-CM | POA: Diagnosis not present

## 2021-04-30 MED ORDER — AZITHROMYCIN 250 MG PO TABS: ORAL_TABLET | ORAL | 0 refills | Status: DC

## 2021-04-30 MED ORDER — FLUTICASONE PROPIONATE 50 MCG/ACT NA SUSP: 2.0000 | Freq: Every day | NASAL | 6 refills | Status: DC

## 2021-04-30 NOTE — Assessment & Plan Note (Signed)
abx per orders flonase  Pt will get a home covid test  F/u pcp prn

## 2021-04-30 NOTE — Progress Notes (Signed)
MyChart Video Visit    Virtual Visit via Video Note   This visit type was conducted due to national recommendations for restrictions regarding the COVID-19 Pandemic (e.g. social distancing) in an effort to limit this patient's exposure and mitigate transmission in our community. This patient is at least at moderate risk for complications without adequate follow up. This format is felt to be most appropriate for this patient at this time. Physical exam was limited by quality of the video and audio technology used for the visit. Alinda Dooms was able to get the patient set up on a video visit.  Patient location: Home Patient and provider in visit Provider location: Office  I discussed the limitations of evaluation and management by telemedicine and the availability of in person appointments. The patient expressed understanding and agreed to proceed.  Visit Date: 04/30/2021  Today's healthcare provider: Ann Held, DO     Subjective:    Patient ID: Brandi Ferguson, female    DOB: Jun 27, 1946, 75 y.o.   MRN: [id number]  Chief Complaint  Patient presents with   Sinus Problem    Pt states having swelling in sinus and cough. No fever. NO COVID test    Sinus Problem Associated symptoms include congestion. Pertinent negatives include no coughing, ear pain, shortness of breath or sore throat.  Patient is in today for sinus swelling/ pain and congestion .    Sudafed sinus pain and pressure x 4 days and pnd ---pt also uses flonase daily.  No fever.  No covid test . .    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   ANXIETY DEPRESSION 02/06/2008   Cough 09/27/2008   DIVERTICULOSIS OF COLON 02/06/2008   HYPERLIPIDEMIA 06/27/2008   HYPERTENSION 02/06/2008   HYPOTHYROIDISM 02/06/2008   Irritable bowel syndrome 02/06/2008   Lump or mass in breast 02/06/2008   Overweight(278.02)  06/27/2008   SINUSITIS- ACUTE-NOS 01/04/2010   URI 09/04/2009    Past Surgical History:  Procedure Laterality Date   BREAST EXCISIONAL BIOPSY Right [place]   Right lumpectomy- Benign   CERVICAL CONE BIOPSY  [place]   Fertility work-up   SKIN LESION EXCISION  2006   Left breast   TONSILLECTOMY      Family History  Problem Relation Age of Onset   Alzheimer's disease Mother    Alcohol abuse Father    Diabetes Father    Hyperlipidemia Father    Hypertension Father    Coronary artery disease Father    Cancer Sister     Social History   Socioeconomic History   Marital status: Married    Spouse name: Barnabas Lister   Number of children: 1   Years of education: 16   Highest education level: Not on file  Occupational History   Occupation: Automotive engineer  Tobacco Use   Smoking status: Never   Smokeless tobacco: Never  Substance and Sexual Activity   Alcohol use: No   Drug use: No   Sexual activity: Yes    Partners: Male  Other Topics Concern   Not on file  Social History Narrative   HSG, Completed [employer] -ed. Married "44", 1 adopted daughter "32". 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 Marriage in [place]. ACP- yes  CPR, yes  for short-term mechanical and ICU care, no for long term heroic measures, i.e. Prolonged artificial hydration or nutrition.    Social Determinants of Health   Financial Resource Strain: Not on file  Food Insecurity: Not on file  Transportation Needs: Not on file  Physical Activity: Not on file  Stress: Not on file  Social Connections: Not on file  Intimate Partner Violence: Not on file    Outpatient Medications Prior to Visit  Medication Sig Dispense Refill   ALPRAZolam (XANAX) 0.5 MG tablet TAKE 1 TABLET BY MOUTH 3 TIMES DAILY AS NEEDED. 90 tablet 5   aspirin 81 MG tablet Take 1 tablet (81 mg total) by mouth daily. 30 tablet 11    buPROPion (WELLBUTRIN) 75 MG tablet Take 1 tablet (75 mg total) by mouth 2 (two) times daily. 180 tablet 3   famotidine (PEPCID) 40 MG tablet TAKE 1 TABLET BY MOUTH EVERY DAY 90 tablet 1   hydrochlorothiazide (HYDRODIURIL) 25 MG tablet TAKE 1 TABLET BY MOUTH EVERY DAY 90 tablet 1   hyoscyamine (ANASPAZ) 0.125 MG TBDP disintergrating tablet Place 1 tablet (0.125 mg total) under the tongue every 6 (six) hours as needed. 60 tablet 5   levothyroxine (SYNTHROID) 88 MCG tablet TAKE 1 TABLET (88 MCG TOTAL) BY MOUTH DAILY BEFORE BREAKFAST. 90 tablet 1   lovastatin (MEVACOR) 40 MG tablet TAKE 1 TABLET BY MOUTH EVERYDAY AT BEDTIME 90 tablet 1   No facility-administered medications prior to visit.    No Known Allergies  Review of Systems  HENT:  Positive for congestion and sinus pain. Negative for ear discharge, ear pain and sore throat.   Eyes:  Negative for double vision and photophobia.  Respiratory:  Negative for cough, hemoptysis, sputum production, shortness of breath, wheezing and stridor.   Cardiovascular:  Negative for palpitations, orthopnea, claudication and leg swelling.  Gastrointestinal:  Negative for abdominal pain, blood in stool, constipation, heartburn, melena and vomiting.  Genitourinary:  Negative for dysuria, frequency and urgency.  Musculoskeletal:  Negative for falls.  Neurological:  Negative for sensory change and speech change.      Objective:    Physical Exam Vitals and nursing note reviewed.  Constitutional:      Appearance: Normal appearance.  HENT:     Head: Normocephalic.  Musculoskeletal:        General: Normal range of motion.     Cervical back: Normal range of motion.  Neurological:     Mental Status: She is alert.  Psychiatric:        Behavior: Behavior normal.        Thought Content: Thought content normal.    There were no vitals taken for this visit. Wt Readings from Last 3 Encounters:  08/29/20 179 lb (81.2 kg)  07/06/19 184 lb (83.5 kg)   09/30/18 187 lb (84.8 kg)    Diabetic Foot Exam - Simple   No data filed    Lab Results  Component Value Date   WBC 8.3 08/29/2020   HGB 16.0 (H) 08/29/2020   HCT 46.3 (H) 08/29/2020   PLT 376.0 08/29/2020   GLUCOSE 88 08/29/2020   CHOL 127 08/29/2020   TRIG 108.0 08/29/2020   HDL 47.20 08/29/2020   LDLCALC 58 08/29/2020   ALT 15 08/29/2020   AST 26 08/29/2020   NA 140 08/29/2020   K 3.5 08/29/2020   CL 104 08/29/2020   CREATININE 1.03 08/29/2020   BUN 14 08/29/2020   CO2 28 08/29/2020   TSH 1.04 08/29/2020  INR 0.95 02/12/2010   HGBA1C 4.9 07/06/2019    Lab Results  Component Value Date   TSH 1.04 08/29/2020   Lab Results  Component Value Date   WBC 8.3 08/29/2020   HGB 16.0 (H) 08/29/2020   HCT 46.3 (H) 08/29/2020   MCV 87.2 08/29/2020   PLT 376.0 08/29/2020   Lab Results  Component Value Date   NA 140 08/29/2020   K 3.5 08/29/2020   CO2 28 08/29/2020   GLUCOSE 88 08/29/2020   BUN 14 08/29/2020   CREATININE 1.03 08/29/2020   BILITOT 0.6 08/29/2020   ALKPHOS 74 08/29/2020   AST 26 08/29/2020   ALT 15 08/29/2020   PROT 7.6 08/29/2020   ALBUMIN 3.9 08/29/2020   CALCIUM 9.7 08/29/2020   GFR 54.50 (L) 08/29/2020   Lab Results  Component Value Date   CHOL 127 08/29/2020   Lab Results  Component Value Date   HDL 47.20 08/29/2020   Lab Results  Component Value Date   LDLCALC 58 08/29/2020   Lab Results  Component Value Date   TRIG 108.0 08/29/2020   Lab Results  Component Value Date   CHOLHDL 3 08/29/2020   Lab Results  Component Value Date   HGBA1C 4.9 07/06/2019       Assessment & Plan:   Problem List Items Addressed This Visit       Unprioritized   Acute recurrent pansinusitis - Primary    abx per orders flonase  Pt will get a home covid test  F/u pcp prn        Relevant Medications   azithromycin (ZITHROMAX Z-PAK) 250 MG tablet   fluticasone (FLONASE) 50 MCG/ACT nasal spray      Meds ordered this encounter   Medications   azithromycin (ZITHROMAX Z-PAK) 250 MG tablet    Sig: As directed    Dispense:  6 each    Refill:  0   fluticasone (FLONASE) 50 MCG/ACT nasal spray    Sig: Place 2 sprays into both nostrils daily.    Dispense:  16 g    Refill:  6    I discussed the assessment and treatment plan with the patient. The patient was provided an opportunity to ask questions and all were answered. The patient agreed with the plan and demonstrated an understanding of the instructions.   The patient was advised to call back or seek an in-person evaluation if the symptoms worsen or if the condition fails to improve as anticipated.  I provided 25 minutes of face-to-face time during this encounter.   Ann Held, DO [place] [facility] [phone] (phone) [phone] (fax)  [place]

## 2021-05-01 DIAGNOSIS — D224 Melanocytic nevi of scalp and neck: Secondary | ICD-10-CM | POA: Diagnosis not present

## 2021-05-01 DIAGNOSIS — D2239 Melanocytic nevi of other parts of face: Secondary | ICD-10-CM | POA: Diagnosis not present

## 2021-05-01 DIAGNOSIS — L814 Other melanin hyperpigmentation: Secondary | ICD-10-CM | POA: Diagnosis not present

## 2021-05-01 DIAGNOSIS — D220 Melanocytic nevi of lip: Secondary | ICD-10-CM | POA: Diagnosis not present

## 2021-06-06 ENCOUNTER — Other Ambulatory Visit: Payer: Self-pay | Admitting: Internal Medicine

## 2021-06-25 DIAGNOSIS — L720 Epidermal cyst: Secondary | ICD-10-CM | POA: Diagnosis not present

## 2021-06-25 DIAGNOSIS — C441192 Basal cell carcinoma of skin of left lower eyelid, including canthus: Secondary | ICD-10-CM | POA: Diagnosis not present

## 2021-07-03 ENCOUNTER — Other Ambulatory Visit: Payer: Self-pay | Admitting: Internal Medicine

## 2021-07-03 DIAGNOSIS — Z1231 Encounter for screening mammogram for malignant neoplasm of breast: Secondary | ICD-10-CM

## 2021-08-02 ENCOUNTER — Other Ambulatory Visit: Payer: Self-pay | Admitting: Internal Medicine

## 2021-08-03 ENCOUNTER — Other Ambulatory Visit: Payer: Self-pay | Admitting: Internal Medicine

## 2021-08-30 ENCOUNTER — Ambulatory Visit (INDEPENDENT_AMBULATORY_CARE_PROVIDER_SITE_OTHER): Payer: Medicare Other

## 2021-08-30 DIAGNOSIS — Z0000 Encounter for general adult medical examination without abnormal findings: Secondary | ICD-10-CM

## 2021-08-30 NOTE — Progress Notes (Signed)
I connected with Ares Tegtmeyer today by telephone and verified that I am speaking with the correct person using two identifiers. Location patient: home Location provider: work Persons participating in the virtual visit: patient, provider.   I discussed the limitations, risks, security and privacy concerns of performing an evaluation and management service by telephone and the availability of in person appointments. I also discussed with the patient that there may be a patient responsible charge related to this service. The patient expressed understanding and verbally consented to this telephonic visit.    Interactive audio and video telecommunications were attempted between this provider and patient, however failed, due to patient having technical difficulties OR patient did not have access to video capability.  We continued and completed visit with audio only.  Some vital signs may be absent or patient reported.   Time Spent with patient on telephone encounter: 40 minutes  Subjective:   Brandi Ferguson is a 75 y.o. female who presents for Medicare Annual (Subsequent) preventive examination.  Review of Systems    No ROS. Medicare Wellness Virtual Visit. Additional risk factors are reflected in social history.  Cardiac Risk Factors include: advanced age (>29men, >86 women);dyslipidemia;family history of premature cardiovascular disease;hypertension     Objective:    There were no vitals filed for this visit. There is no height or weight on file to calculate BMI.  Advanced Directives 08/30/2021  Does Patient Have a Medical Advance Directive? Yes  Type of Advance Directive Living will;Healthcare Power of Attorney  Does patient want to make changes to medical advance directive? No - Patient declined  Copy of [place] in Chart? No - copy requested    Current Medications (verified) Outpatient Encounter Medications as of 08/30/2021  Medication Sig   ALPRAZolam  (XANAX) 0.5 MG tablet TAKE 1 TABLET BY MOUTH 3 TIMES DAILY AS NEEDED.   aspirin 81 MG tablet Take 1 tablet (81 mg total) by mouth daily.   buPROPion (WELLBUTRIN) 75 MG tablet Take 1 tablet (75 mg total) by mouth 2 (two) times daily.   famotidine (PEPCID) 40 MG tablet TAKE 1 TABLET BY MOUTH EVERY DAY   fluticasone (FLONASE) 50 MCG/ACT nasal spray Place 2 sprays into both nostrils daily.   hydrochlorothiazide (HYDRODIURIL) 25 MG tablet TAKE 1 TABLET BY MOUTH EVERY DAY   hyoscyamine (ANASPAZ) 0.125 MG TBDP disintergrating tablet Place 1 tablet (0.125 mg total) under the tongue every 6 (six) hours as needed.   levothyroxine (SYNTHROID) 88 MCG tablet TAKE 1 TABLET BY MOUTH DAILY BEFORE BREAKFAST.   lovastatin (MEVACOR) 40 MG tablet TAKE 1 TABLET BY MOUTH EVERYDAY AT BEDTIME   azithromycin (ZITHROMAX Z-PAK) 250 MG tablet As directed (Patient not taking: Reported on 08/30/2021)   No facility-administered encounter medications on file as of 08/30/2021.    Allergies (verified) Patient has no known allergies.   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   ANXIETY DEPRESSION 02/06/2008   Cough 09/27/2008   DIVERTICULOSIS OF COLON 02/06/2008   HYPERLIPIDEMIA 06/27/2008   HYPERTENSION 02/06/2008   HYPOTHYROIDISM 02/06/2008   Irritable bowel syndrome 02/06/2008   Lump or mass in breast 02/06/2008   Overweight(278.02) 06/27/2008   SINUSITIS- ACUTE-NOS 01/04/2010   URI 09/04/2009   Past Surgical History:  Procedure Laterality Date   BREAST EXCISIONAL BIOPSY Right 1984   BREAST SURGERY  1984   Right lumpectomy- Benign   [place]   Fertility work-up  SKIN LESION EXCISION  2006   Left breast   TONSILLECTOMY     Family History  Problem Relation Age of Onset   Alzheimer's disease Mother    Alcohol abuse Father    Diabetes Father    Hyperlipidemia Father    Hypertension Father    Coronary artery disease Father     Cancer Sister    Social History   Socioeconomic History   Marital status: Married    Spouse name: Barnabas Lister   Number of children: 1   Years of education: 16   Highest education level: Not on file  Occupational History   Occupation: Automotive engineer  Tobacco Use   Smoking status: Never   Smokeless tobacco: Never  Substance and Sexual Activity   Alcohol use: No   Drug use: No   Sexual activity: Yes    Partners: Male  Other Topics Concern   Not on file  Social History Narrative   HSG, Completed [employer] -ed. Married "60", 1 adopted daughter "58". 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 Marriage in [place]. 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 Prolonged artificial hydration or nutrition.    Social Determinants of Health   Financial Resource Strain: Low Risk    Difficulty of Paying Living Expenses: Not hard at all  Food Insecurity: No Food Insecurity   Worried About Charity fundraiser in the Last Year: Never true   [place] in the Last Year: Never true  Transportation Needs: No Transportation Needs   Lack of Transportation (Medical): No   Lack of Transportation (Non-Medical): No  Physical Activity: Sufficiently Active   Days of Exercise per Week: 5 days   Minutes of Exercise per Session: 30 min  Stress: No Stress Concern Present   Feeling of Stress : Not at all  Social Connections: Unknown   Frequency of Communication with Friends and Family: More than three times a week   Frequency of Social Gatherings with Friends and Family: More than three times a week   Attends Religious Services: Not on Electrical engineer or Organizations: Not on file   Attends Archivist Meetings: 1 to 4 times per year   Marital Status: Married    Tobacco Counseling Counseling given: Not Answered   Clinical Intake:  Pre-visit preparation  completed: Yes  Pain : No/denies pain     Nutritional Risks: None Diabetes: No  How often do you need to have someone help you when you read instructions, pamphlets, or other written materials from your doctor or pharmacy?: 1 - Never What is the last grade level you completed in school?: Bachelor's Degree; Retired Pharmacist, hospital  Diabetic? no  Interpreter Needed?: No  Information entered by :: Lisette Abu, LPN   Activities of Daily Living In your present state of health, do you have any difficulty performing the following activities: 08/30/2021  Hearing? Y  Comment wears hearing aids  Vision? Y  Difficulty concentrating or making decisions? N  Walking or climbing stairs? N  Dressing or bathing? N  Doing errands, shopping? N  Preparing Food and eating ? N  Using the Toilet? N  In the past six months, have you accidently leaked urine? N  Do you have problems with loss of bowel control? N  Managing your Medications? N  Managing your Finances? N  Housekeeping  or managing your Housekeeping? N  Some recent data might be hidden    Patient Care Team: Hoyt Koch, MD as PCP - General (Internal Medicine) Druscilla Brownie, MD as Consulting Physician (Dermatology) Earlie Server, MD (Orthopedic Surgery) Gus Height, MD (Inactive) as Consulting Physician (Obstetrics and Gynecology) Iran Ouch, MD as Referring Physician (Ophthalmology)  Indicate any recent Medical Services you may have received from other than [facility] providers in the past year (date may be approximate).     Assessment:   This is a routine wellness examination for [place].  Hearing/Vision screen Hearing Screening - Comments:: Patient wears hearing aids. Vision Screening - Comments:: Patient wears corrective glasses/contacts.  Eye exam done by: Dr. Ramonita Lab  Dietary issues and exercise activities discussed: Current Exercise Habits: Home exercise routine, Type of exercise: walking, Time  (Minutes): 30, Frequency (Times/Week): 5, Weekly Exercise (Minutes/Week): 150, Intensity: Moderate, Exercise limited by: None identified   Goals Addressed               This Visit's Progress     Patient Stated (pt-stated)        No goals at this time.      Depression Screen PHQ 2/9 Scores 08/30/2021 08/29/2020 07/06/2019 07/06/2019 07/02/2018 07/01/2017 10/19/2015  PHQ - 2 Score 0 0 1 0 0 0 0  PHQ- 9 Score - 0 3 - - - -    Fall Risk Fall Risk  08/30/2021 08/29/2020 07/06/2019 07/02/2018 07/01/2017  Falls in the past year? 0 0 0 No No  Number falls in past yr: 0 0 - - -  Injury with Fall? 0 0 - - -  Risk for fall due to : No Fall Risks - - - -  Follow up Falls evaluation completed - - - -    FALL RISK PREVENTION PERTAINING TO THE HOME:  Any stairs in or around the home? No  If so, are there any without handrails? No  Home free of loose throw rugs in walkways, pet beds, electrical cords, etc? Yes  Adequate lighting in your home to reduce risk of falls? Yes   ASSISTIVE DEVICES UTILIZED TO PREVENT FALLS:  Life alert? No  Use of a cane, walker or w/c? No  Grab bars in the bathroom? Yes  Shower chair or bench in shower? Yes  Elevated toilet seat or a handicapped toilet? Yes   TIMED UP AND GO:  Was the test performed? No .  Length of time to ambulate 10 feet: n/a sec.   Gait steady and fast without use of assistive device  Cognitive Function: Normal cognitive status assessed by direct observation by this Nurse Health Advisor. No abnormalities found.          Immunizations Immunization History  Administered Date(s) Administered   Fluad Quad(high Dose 65+) 07/06/2019, 08/29/2020   Influenza, High Dose Seasonal PF 07/01/2017, 07/02/2018, 08/13/2021   PFIZER(Purple Top)SARS-COV-2 Vaccination 11/28/2019, 12/21/2019, 09/21/2020   Td 06/27/2008    TDAP status: Due, Education has been provided regarding the importance of this vaccine. Advised may receive this vaccine at  local pharmacy or Health Dept. Aware to provide a copy of the vaccination record if obtained from local pharmacy or Health Dept. Verbalized acceptance and understanding.  Flu Vaccine status: Up to date  Pneumococcal vaccine status: Declined,  Education has been provided regarding the importance of this vaccine but patient still declined. Advised may receive this vaccine at local pharmacy or Health Dept. Aware to provide a copy of the vaccination record  if obtained from local pharmacy or Health Dept. Verbalized acceptance and understanding.   Covid-19 vaccine status: Completed vaccines  Qualifies for Shingles Vaccine? Yes   Zostavax completed No   Shingrix Completed?: No.    Education has been provided regarding the importance of this vaccine. Patient has been advised to call insurance company to determine out of pocket expense if they have not yet received this vaccine. Advised may also receive vaccine at local pharmacy or Health Dept. Verbalized acceptance and understanding.  Screening Tests Health Maintenance  Topic Date Due   Pneumonia Vaccine 47+ Years old (1 - PCV) Never done   Hepatitis C Screening  Never done   Zoster Vaccines- Shingrix (1 of 2) Never done   TETANUS/TDAP  06/27/2018   COVID-19 Vaccine (5 - Booster) 11/16/2020   MAMMOGRAM  09/13/2022   COLONOSCOPY (Pts 45-37yrs Insurance coverage will need to be confirmed)  05/15/2025   INFLUENZA VACCINE  Completed   DEXA SCAN  Completed   HPV VACCINES  Aged Out    Health Maintenance  Health Maintenance Due  Topic Date Due   Pneumonia Vaccine 11+ Years old (1 - PCV) Never done   Hepatitis C Screening  Never done   Zoster Vaccines- Shingrix (1 of 2) Never done   TETANUS/TDAP  06/27/2018   COVID-19 Vaccine (5 - Booster) 11/16/2020    Colorectal cancer screening: Type of screening: Colonoscopy. Completed 10/1/20021. Repeat every 5 years  Mammogram status: Completed 09/13/2020. Repeat every year (scheduled 09/19/2021)  Bone  Density status: Completed 04/03/2009. Results reflect: Bone density results: NORMAL. Repeat every 0 years.  Lung Cancer Screening: (Low Dose CT Chest recommended if Age 19-80 years, 30 pack-year currently smoking OR have quit w/in 15years.) does not qualify.   Lung Cancer Screening Referral: no  Additional Screening:  Hepatitis C Screening: does qualify; Completed: no  Vision Screening: Recommended annual ophthalmology exams for early detection of glaucoma and other disorders of the eye. Is the patient up to date with their annual eye exam?  Yes  Who is the provider or what is the name of the office in which the patient attends annual eye exams? Ramonita Lab, MD. If pt is not established with a provider, would they like to be referred to a provider to establish care? No .   Dental Screening: Recommended annual dental exams for proper oral hygiene  Community Resource Referral / Chronic Care Management: CRR required this visit?  No   CCM required this visit?  No      Plan:     I have personally reviewed and noted the following in the patient's chart:   Medical and social history Use of alcohol, tobacco or illicit drugs  Current medications and supplements including opioid prescriptions.  Functional ability and status Nutritional status Physical activity Advanced directives List of other physicians Hospitalizations, surgeries, and ER visits in previous 12 months Vitals Screenings to include cognitive, depression, and falls Referrals and appointments  In addition, I have reviewed and discussed with patient certain preventive protocols, quality metrics, and best practice recommendations. A written personalized care plan for preventive services as well as general preventive health recommendations were provided to patient.     Sheral Flow, LPN   [id number]   Nurse Notes: n/a

## 2021-08-30 NOTE — Patient Instructions (Signed)
Brandi Ferguson , Thank you for taking time to come for your Medicare Wellness Visit. I appreciate your ongoing commitment to your health goals. Please review the following plan we discussed and let me know if I can assist you in the future.   Screening recommendations/referrals: Colonoscopy: 07/14/2020; due every 5 years (family history) Mammogram: scheduled for 09/19/2021 Bone Density: 04/03/2009; normal results Recommended yearly ophthalmology/optometry visit for glaucoma screening and checkup Recommended yearly dental visit for hygiene and checkup  Vaccinations: Influenza vaccine: 08/13/2021 Pneumococcal vaccine: declined Tdap vaccine: declined Shingles vaccine: declined   Covid-19: 11/28/2019, 12/21/2019, 09/21/2020  Advanced directives: Please bring a copy of your health care power of attorney and living will to the office at your convenience.  Conditions/risks identified: Yes; Client understands the importance of follow-up with providers by attending scheduled visits and discussed goals to eat healthier, increase physical activity, exercise the brain, socialize more, get enough sleep and make time for laughter.  Next appointment: Please schedule your next Medicare Wellness Visit with your Nurse Health Advisor in 1 year by calling [phone].   Preventive Care 29 Years and Older, Female Preventive care refers to lifestyle choices and visits with your health care provider that can promote health and wellness. What does preventive care include? A yearly physical exam. This is also called an annual well check. Dental exams once or twice a year. Routine eye exams. Ask your health care provider how often you should have your eyes checked. Personal lifestyle choices, including: Daily care of your teeth and gums. Regular physical activity. Eating a healthy diet. Avoiding tobacco and drug use. Limiting alcohol use. Practicing safe sex. Taking low-dose aspirin every day. Taking vitamin and  mineral supplements as recommended by your health care provider. What happens during an annual well check? The services and screenings done by your health care provider during your annual well check will depend on your age, overall health, lifestyle risk factors, and family history of disease. Counseling  Your health care provider may ask you questions about your: Alcohol use. Tobacco use. Drug use. Emotional well-being. Home and relationship well-being. Sexual activity. Eating habits. History of falls. Memory and ability to understand (cognition). Work and work Statistician. Reproductive health. Screening  You may have the following tests or measurements: Height, weight, and BMI. Blood pressure. Lipid and cholesterol levels. These may be checked every 5 years, or more frequently if you are over 49 years old. Skin check. Lung cancer screening. You may have this screening every year starting at age 69 if you have a 30-pack-year history of smoking and currently smoke or have quit within the past 15 years. Fecal occult blood test (FOBT) of the stool. You may have this test every year starting at age 2. Flexible sigmoidoscopy or colonoscopy. You may have a sigmoidoscopy every 5 years or a colonoscopy every 10 years starting at age 51. Hepatitis C blood test. Hepatitis B blood test. Sexually transmitted disease (STD) testing. Diabetes screening. This is done by checking your blood sugar (glucose) after you have not eaten for a while (fasting). You may have this done every 1-3 years. Bone density scan. This is done to screen for osteoporosis. You may have this done starting at age 47. Mammogram. This may be done every 1-2 years. Talk to your health care provider about how often you should have regular mammograms. Talk with your health care provider about your test results, treatment options, and if necessary, the need for more tests. Vaccines  Your health care provider may  recommend  certain vaccines, such as: Influenza vaccine. This is recommended every year. Tetanus, diphtheria, and acellular pertussis (Tdap, Td) vaccine. You may need a Td booster every 10 years. Zoster vaccine. You may need this after age 34. Pneumococcal 13-valent conjugate (PCV13) vaccine. One dose is recommended after age 67. Pneumococcal polysaccharide (PPSV23) vaccine. One dose is recommended after age 54. Talk to your health care provider about which screenings and vaccines you need and how often you need them. This information is not intended to replace advice given to you by your health care provider. Make sure you discuss any questions you have with your health care provider. Document Released: 10/27/2015 Document Revised: 06/19/2016 Document Reviewed: 08/01/2015 Elsevier Interactive Patient Education  2017 [employer] Prevention in the Home Falls can cause injuries. They can happen to people of all ages. There are many things you can do to make your home safe and to help prevent falls. What can I do on the outside of my home? Regularly fix the edges of walkways and driveways and fix any cracks. Remove anything that might make you trip as you walk through a door, such as a raised step or threshold. Trim any bushes or trees on the path to your home. Use bright outdoor lighting. Clear any walking paths of anything that might make someone trip, such as rocks or tools. Regularly check to see if handrails are loose or broken. Make sure that both sides of any steps have handrails. Any raised decks and porches should have guardrails on the edges. Have any leaves, snow, or ice cleared regularly. Use sand or salt on walking paths during winter. Clean up any spills in your garage right away. This includes oil or grease spills. What can I do in the bathroom? Use night lights. Install grab bars by the toilet and in the tub and shower. Do not use towel bars as grab bars. Use non-skid mats or  decals in the tub or shower. If you need to sit down in the shower, use a plastic, non-slip stool. Keep the floor dry. Clean up any water that spills on the floor as soon as it happens. Remove soap buildup in the tub or shower regularly. Attach bath mats securely with double-sided non-slip rug tape. Do not have throw rugs and other things on the floor that can make you trip. What can I do in the bedroom? Use night lights. Make sure that you have a light by your bed that is easy to reach. Do not use any sheets or blankets that are too big for your bed. They should not hang down onto the floor. Have a firm chair that has side arms. You can use this for support while you get dressed. Do not have throw rugs and other things on the floor that can make you trip. What can I do in the kitchen? Clean up any spills right away. Avoid walking on wet floors. Keep items that you use a lot in easy-to-reach places. If you need to reach something above you, use a strong step stool that has a grab bar. Keep electrical cords out of the way. Do not use floor polish or wax that makes floors slippery. If you must use wax, use non-skid floor wax. Do not have throw rugs and other things on the floor that can make you trip. What can I do with my stairs? Do not leave any items on the stairs. Make sure that there are handrails on both sides of  the stairs and use them. Fix handrails that are broken or loose. Make sure that handrails are as long as the stairways. Check any carpeting to make sure that it is firmly attached to the stairs. Fix any carpet that is loose or worn. Avoid having throw rugs at the top or bottom of the stairs. If you do have throw rugs, attach them to the floor with carpet tape. Make sure that you have a light switch at the top of the stairs and the bottom of the stairs. If you do not have them, ask someone to add them for you. What else can I do to help prevent falls? Wear shoes that: Do not  have high heels. Have rubber bottoms. Are comfortable and fit you well. Are closed at the toe. Do not wear sandals. If you use a stepladder: Make sure that it is fully opened. Do not climb a closed stepladder. Make sure that both sides of the stepladder are locked into place. Ask someone to hold it for you, if possible. Clearly mark and make sure that you can see: Any grab bars or handrails. First and last steps. Where the edge of each step is. Use tools that help you move around (mobility aids) if they are needed. These include: Canes. Walkers. Scooters. Crutches. Turn on the lights when you go into a dark area. Replace any light bulbs as soon as they burn out. Set up your furniture so you have a clear path. Avoid moving your furniture around. If any of your floors are uneven, fix them. If there are any pets around you, be aware of where they are. Review your medicines with your doctor. Some medicines can make you feel dizzy. This can increase your chance of falling. Ask your doctor what other things that you can do to help prevent falls. This information is not intended to replace advice given to you by your health care provider. Make sure you discuss any questions you have with your health care provider. Document Released: 07/27/2009 Document Revised: 03/07/2016 Document Reviewed: 11/04/2014 Elsevier Interactive Patient Education  2017 [employer].

## 2021-08-31 ENCOUNTER — Other Ambulatory Visit: Payer: Self-pay

## 2021-08-31 ENCOUNTER — Ambulatory Visit (INDEPENDENT_AMBULATORY_CARE_PROVIDER_SITE_OTHER): Payer: Medicare Other | Admitting: Internal Medicine

## 2021-08-31 ENCOUNTER — Encounter: Payer: Self-pay | Admitting: Internal Medicine

## 2021-08-31 VITALS — BP 122/78 | HR 86 | Resp 18 | Ht 62.0 in | Wt 174.6 lb

## 2021-08-31 DIAGNOSIS — E782 Mixed hyperlipidemia: Secondary | ICD-10-CM

## 2021-08-31 DIAGNOSIS — K582 Mixed irritable bowel syndrome: Secondary | ICD-10-CM

## 2021-08-31 DIAGNOSIS — F324 Major depressive disorder, single episode, in partial remission: Secondary | ICD-10-CM

## 2021-08-31 DIAGNOSIS — I10 Essential (primary) hypertension: Secondary | ICD-10-CM | POA: Diagnosis not present

## 2021-08-31 DIAGNOSIS — Z0000 Encounter for general adult medical examination without abnormal findings: Secondary | ICD-10-CM

## 2021-08-31 DIAGNOSIS — Z23 Encounter for immunization: Secondary | ICD-10-CM

## 2021-08-31 DIAGNOSIS — E039 Hypothyroidism, unspecified: Secondary | ICD-10-CM

## 2021-08-31 LAB — COMPREHENSIVE METABOLIC PANEL
ALT: 16 U/L (ref 0–35)
AST: 25 U/L (ref 0–37)
Albumin: 4.1 g/dL (ref 3.5–5.2)
Alkaline Phosphatase: 57 U/L (ref 39–117)
BUN: 15 mg/dL (ref 6–23)
CO2: 28 mEq/L (ref 19–32)
Calcium: 9.7 mg/dL (ref 8.4–10.5)
Chloride: 104 mEq/L (ref 96–112)
Creatinine, Ser: 1.09 mg/dL (ref 0.40–1.20)
GFR: 50.56 mL/min — ABNORMAL LOW (ref >60.00)
Glucose, Bld: 93 mg/dL (ref 70–99)
Potassium: 3.5 mEq/L (ref 3.5–5.1)
Sodium: 141 mEq/L (ref 135–145)
Total Bilirubin: 1.1 mg/dL (ref 0.2–1.2)
Total Protein: 7.3 g/dL (ref 6.0–8.3)

## 2021-08-31 LAB — CBC
HCT: 44.5 % (ref 36.0–46.0)
Hemoglobin: 15.5 g/dL — ABNORMAL HIGH (ref 12.0–15.0)
MCHC: 34.9 g/dL (ref 30.0–36.0)
MCV: 87.7 fl (ref 78.0–100.0)
Platelets: 298 10*3/uL (ref 150.0–400.0)
RBC: 5.08 Mil/uL (ref 3.87–5.11)
RDW: 14.3 % (ref 11.5–15.5)
WBC: 7 10*3/uL (ref 4.0–10.5)

## 2021-08-31 LAB — LIPID PANEL
Cholesterol: 130 mg/dL (ref 0–200)
HDL: 43.1 mg/dL (ref >39.00)
LDL Cholesterol: 65 mg/dL (ref 0–99)
NonHDL: 87.03
Total CHOL/HDL Ratio: 3
Triglycerides: 110 mg/dL (ref 0.0–149.0)
VLDL: 22 mg/dL (ref 0.0–40.0)

## 2021-08-31 LAB — TSH: TSH: 0.15 u[IU]/mL — ABNORMAL LOW (ref 0.35–5.50)

## 2021-08-31 LAB — T4, FREE: Free T4: 1.1 ng/dL (ref 0.60–1.60)

## 2021-08-31 MED ORDER — BUPROPION HCL 75 MG PO TABS
75.0000 mg | ORAL_TABLET | Freq: Every day | ORAL | 3 refills | Status: DC
Start: 2021-08-31 — End: 2022-09-02

## 2021-08-31 MED ORDER — LOVASTATIN 40 MG PO TABS
40.0000 mg | ORAL_TABLET | Freq: Every day | ORAL | 3 refills | Status: DC
Start: 2021-08-31 — End: 2022-10-21

## 2021-08-31 NOTE — Assessment & Plan Note (Signed)
BP at goal on hctz 25 mg daily and checking CMP and adjust as needed.

## 2021-08-31 NOTE — Assessment & Plan Note (Signed)
She is taking wellbutrin 75 mg daily instead of BID and controlled so we will change dosing to reflect how she is taking medication. New rx done today.

## 2021-08-31 NOTE — Patient Instructions (Signed)
We will check the labs today. 

## 2021-08-31 NOTE — Progress Notes (Signed)
   Subjective:   Patient ID: Brandi Ferguson, female    DOB: 03-02-1946, 75 y.o.   MRN: [id number]  HPI The patient is a 75 YO female coming in for physical.   PMH, [place], social history reviewed and updated  Review of Systems  Constitutional: Negative.   HENT: Negative.    Eyes: Negative.   Respiratory:  Negative for cough, chest tightness and shortness of breath.   Cardiovascular:  Negative for chest pain, palpitations and leg swelling.  Gastrointestinal:  Negative for abdominal distention, abdominal pain, constipation, diarrhea, nausea and vomiting.  Musculoskeletal: Negative.   Skin: Negative.   Neurological: Negative.   Psychiatric/Behavioral: Negative.     Objective:  Physical Exam Constitutional:      Appearance: She is well-developed.  HENT:     Head: Normocephalic and atraumatic.  Cardiovascular:     Rate and Rhythm: Normal rate and regular rhythm.  Pulmonary:     Effort: Pulmonary effort is normal. No respiratory distress.     Breath sounds: Normal breath sounds. No wheezing or rales.  Abdominal:     General: Bowel sounds are normal. There is no distension.     Palpations: Abdomen is soft.     Tenderness: There is no abdominal tenderness. There is no rebound.  Musculoskeletal:     Cervical back: Normal range of motion.  Skin:    General: Skin is warm and dry.  Neurological:     Mental Status: She is alert and oriented to person, place, and time.     Coordination: Coordination normal.    Vitals:   08/31/21 0854  BP: 122/78  Pulse: 86  Resp: 18  SpO2: 97%  Weight: 174 lb 9.6 oz (79.2 kg)  Height: 5\' 2"  (1.575 m)    This visit occurred during the SARS-CoV-2 public health emergency.  Safety protocols were in place, including screening questions prior to the visit, additional usage of staff PPE, and extensive cleaning of exam room while observing appropriate contact time as indicated for disinfecting solutions.   Assessment & Plan:  Prevnar 20 given at  visit

## 2021-08-31 NOTE — Assessment & Plan Note (Signed)
Flu shot up to date. Covid-19 booster counseled. Pneumonia given 20 today to complete. Shingrix counseled. Tetanus counseled. Colonoscopy due 2027. Mammogram due 2023, pap smear aged out and dexa due declines further. Counseled about sun safety and mole surveillance. Counseled about the dangers of distracted driving. Given 10 year screening recommendations.

## 2021-08-31 NOTE — Assessment & Plan Note (Signed)
Checking TSH and free T4 and adjust synthroid 88mcg daily as needed. 

## 2021-08-31 NOTE — Assessment & Plan Note (Signed)
Checking lipid panel and adjust lovastatin 40 mg daily as needed. 

## 2021-08-31 NOTE — Assessment & Plan Note (Signed)
Uses hyoscyamine prn and watches diet to help.

## 2021-09-04 ENCOUNTER — Telehealth: Payer: Self-pay | Admitting: Internal Medicine

## 2021-09-04 MED ORDER — HYOSCYAMINE SULFATE 0.125 MG PO TBDP
0.1250 mg | ORAL_TABLET | Freq: Four times a day (QID) | ORAL | 5 refills | Status: DC | PRN
Start: 2021-09-04 — End: 2021-12-04

## 2021-09-04 NOTE — Telephone Encounter (Signed)
Ok to refill 

## 2021-09-04 NOTE — Telephone Encounter (Signed)
Patient calling in  Had OV recently & all scripts were refilled but one  Patient is needing new script for:   Hyoscyamine (ANASPAZ) 0.125 MG TBDP disintergrating tablet   Pharmacy:  [facility] [place], [place]  Phone:  [phone] Fax:  [phone]

## 2021-09-13 ENCOUNTER — Other Ambulatory Visit: Payer: Self-pay | Admitting: Internal Medicine

## 2021-09-13 NOTE — Telephone Encounter (Signed)
Patient requesting to have rx ALPRAZolam (XANAX) 0.5 MG tablet filled   Pharmacy *see below*

## 2021-09-13 NOTE — Telephone Encounter (Signed)
See below

## 2021-09-14 NOTE — Telephone Encounter (Signed)
Done via med refill

## 2021-09-19 ENCOUNTER — Ambulatory Visit
Admission: RE | Admit: 2021-09-19 | Discharge: 2021-09-19 | Disposition: A | Discharge status: Home / Self Care | Payer: Medicare Other | Source: Ambulatory Visit | Attending: Internal Medicine | Admitting: Internal Medicine

## 2021-09-19 DIAGNOSIS — Z1231 Encounter for screening mammogram for malignant neoplasm of breast: Secondary | ICD-10-CM

## 2021-09-25 DIAGNOSIS — Z20822 Contact with and (suspected) exposure to covid-19: Secondary | ICD-10-CM | POA: Diagnosis not present

## 2021-09-25 DIAGNOSIS — U071 COVID-19: Secondary | ICD-10-CM | POA: Diagnosis not present

## 2021-11-21 DIAGNOSIS — H2513 Age-related nuclear cataract, bilateral: Secondary | ICD-10-CM | POA: Diagnosis not present

## 2021-11-29 ENCOUNTER — Other Ambulatory Visit: Payer: Self-pay | Admitting: Internal Medicine

## 2021-12-01 ENCOUNTER — Other Ambulatory Visit: Payer: Self-pay | Admitting: Internal Medicine

## 2022-01-24 DIAGNOSIS — H25013 Cortical age-related cataract, bilateral: Secondary | ICD-10-CM | POA: Diagnosis not present

## 2022-01-24 DIAGNOSIS — H2513 Age-related nuclear cataract, bilateral: Secondary | ICD-10-CM | POA: Diagnosis not present

## 2022-01-24 DIAGNOSIS — H25043 Posterior subcapsular polar age-related cataract, bilateral: Secondary | ICD-10-CM | POA: Diagnosis not present

## 2022-01-24 DIAGNOSIS — H18413 Arcus senilis, bilateral: Secondary | ICD-10-CM | POA: Diagnosis not present

## 2022-01-29 ENCOUNTER — Other Ambulatory Visit: Payer: Self-pay | Admitting: Internal Medicine

## 2022-01-30 ENCOUNTER — Other Ambulatory Visit: Payer: Self-pay | Admitting: Internal Medicine

## 2022-02-14 ENCOUNTER — Other Ambulatory Visit (HOSPITAL_COMMUNITY): Payer: Self-pay

## 2022-03-14 ENCOUNTER — Other Ambulatory Visit: Payer: Self-pay | Admitting: Internal Medicine

## 2022-03-23 DIAGNOSIS — N3001 Acute cystitis with hematuria: Secondary | ICD-10-CM | POA: Diagnosis not present

## 2022-03-23 DIAGNOSIS — R309 Painful micturition, unspecified: Secondary | ICD-10-CM | POA: Diagnosis not present

## 2022-04-24 DIAGNOSIS — H2512 Age-related nuclear cataract, left eye: Secondary | ICD-10-CM | POA: Diagnosis not present

## 2022-04-25 DIAGNOSIS — H2511 Age-related nuclear cataract, right eye: Secondary | ICD-10-CM | POA: Diagnosis not present

## 2022-05-08 DIAGNOSIS — H2511 Age-related nuclear cataract, right eye: Secondary | ICD-10-CM | POA: Diagnosis not present

## 2022-05-13 DIAGNOSIS — Z85828 Personal history of other malignant neoplasm of skin: Secondary | ICD-10-CM | POA: Diagnosis not present

## 2022-05-13 DIAGNOSIS — L814 Other melanin hyperpigmentation: Secondary | ICD-10-CM | POA: Diagnosis not present

## 2022-05-13 DIAGNOSIS — D225 Melanocytic nevi of trunk: Secondary | ICD-10-CM | POA: Diagnosis not present

## 2022-05-13 DIAGNOSIS — L821 Other seborrheic keratosis: Secondary | ICD-10-CM | POA: Diagnosis not present

## 2022-05-28 ENCOUNTER — Other Ambulatory Visit: Payer: Self-pay | Admitting: Internal Medicine

## 2022-06-12 DIAGNOSIS — H524 Presbyopia: Secondary | ICD-10-CM | POA: Diagnosis not present

## 2022-06-13 DIAGNOSIS — R051 Acute cough: Secondary | ICD-10-CM | POA: Diagnosis not present

## 2022-06-13 DIAGNOSIS — Z20822 Contact with and (suspected) exposure to covid-19: Secondary | ICD-10-CM | POA: Diagnosis not present

## 2022-06-13 DIAGNOSIS — Z03818 Encounter for observation for suspected exposure to other biological agents ruled out: Secondary | ICD-10-CM | POA: Diagnosis not present

## 2022-06-13 DIAGNOSIS — B372 Candidiasis of skin and nail: Secondary | ICD-10-CM | POA: Diagnosis not present

## 2022-06-28 ENCOUNTER — Other Ambulatory Visit: Payer: Self-pay | Admitting: Internal Medicine

## 2022-06-28 DIAGNOSIS — Z1231 Encounter for screening mammogram for malignant neoplasm of breast: Secondary | ICD-10-CM

## 2022-07-02 ENCOUNTER — Encounter: Payer: Self-pay | Admitting: Internal Medicine

## 2022-07-02 ENCOUNTER — Telehealth: Payer: Self-pay | Admitting: *Deleted

## 2022-07-02 ENCOUNTER — Ambulatory Visit (INDEPENDENT_AMBULATORY_CARE_PROVIDER_SITE_OTHER): Payer: Medicare Other | Admitting: Internal Medicine

## 2022-07-02 VITALS — BP 116/72 | HR 88 | Ht 62.0 in | Wt 160.0 lb

## 2022-07-02 DIAGNOSIS — Z23 Encounter for immunization: Secondary | ICD-10-CM | POA: Diagnosis not present

## 2022-07-02 DIAGNOSIS — K582 Mixed irritable bowel syndrome: Secondary | ICD-10-CM

## 2022-07-02 MED ORDER — HYOSCYAMINE SULFATE 0.125 MG PO TBDP: 0.1250 mg | ORAL_TABLET | Freq: Four times a day (QID) | ORAL | 0 refills | Status: DC | PRN

## 2022-07-02 MED ORDER — OMEPRAZOLE 20 MG PO CPDR: 20.0000 mg | DELAYED_RELEASE_CAPSULE | Freq: Every day | ORAL | 3 refills | Status: DC

## 2022-07-02 NOTE — Patient Instructions (Addendum)
We have sent in omeprazole 20 mg daily to keep taking daily.  We have sent in bentyl to try for pain with the IBS. Take this one as needed.

## 2022-07-02 NOTE — Progress Notes (Signed)
   Subjective:   Patient ID: Brandi Ferguson, female    DOB: [birth year], 76 y.o.   MRN: [id number]  HPI The patient is a 76 YO coming in for follow up IBS.  Review of Systems  Constitutional: Negative.   HENT: Negative.    Eyes: Negative.   Respiratory:  Negative for cough, chest tightness and shortness of breath.   Cardiovascular:  Negative for chest pain, palpitations and leg swelling.  Gastrointestinal:  Positive for abdominal distention, abdominal pain, constipation and diarrhea. Negative for nausea and vomiting.  Musculoskeletal: Negative.   Skin: Negative.   Neurological: Negative.   Psychiatric/Behavioral: Negative.      Objective:  Physical Exam Constitutional:      Appearance: She is well-developed.  HENT:     Head: Normocephalic and atraumatic.  Cardiovascular:     Rate and Rhythm: Normal rate and regular rhythm.  Pulmonary:     Effort: Pulmonary effort is normal. No respiratory distress.     Breath sounds: Normal breath sounds. No wheezing or rales.  Abdominal:     General: Bowel sounds are normal. There is no distension.     Palpations: Abdomen is soft.     Tenderness: There is no abdominal tenderness. There is no rebound.  Musculoskeletal:     Cervical back: Normal range of motion.  Skin:    General: Skin is warm and dry.  Neurological:     Mental Status: She is alert and oriented to person, place, and time.     Coordination: Coordination normal.     Vitals:   07/02/22 1515  BP: 116/72  Pulse: 88  SpO2: 97%  Weight: 160 lb (72.6 kg)  Height: '5\' 2"'$  (1.575 m)    Assessment & Plan:  Flu shot given at visit

## 2022-07-02 NOTE — Telephone Encounter (Signed)
Pt requesting to D/c  Wellbutrin if possible and sending  another medication medication

## 2022-07-05 ENCOUNTER — Encounter: Payer: Self-pay | Admitting: Internal Medicine

## 2022-07-05 NOTE — Assessment & Plan Note (Signed)
Alternating diarrhea and constipation. Rx bentyl to use for pain with BM. Counseled about fiber and dietary changes to help. Has hyoscyamine to use as well for pain. Counseled need for regular BM to avoid constipation.

## 2022-09-02 ENCOUNTER — Ambulatory Visit (INDEPENDENT_AMBULATORY_CARE_PROVIDER_SITE_OTHER): Payer: Medicare Other

## 2022-09-02 VITALS — Ht 62.0 in

## 2022-09-02 DIAGNOSIS — Z0000 Encounter for general adult medical examination without abnormal findings: Secondary | ICD-10-CM

## 2022-09-02 NOTE — Progress Notes (Signed)
Virtual Visit via Telephone Note  I connected with  Brandi Ferguson on 09/02/22 at  8:45 AM EST by telephone and verified that I am speaking with the correct person using two identifiers.  Location: Patient: Home Provider: [place] Persons participating in the virtual visit: [place]   I discussed the limitations, risks, security and privacy concerns of performing an evaluation and management service by telephone and the availability of in person appointments. The patient expressed understanding and agreed to proceed.  Interactive audio and video telecommunications were attempted between this nurse and patient, however failed, due to patient having technical difficulties OR patient did not have access to video capability.  We continued and completed visit with audio only.  Some vital signs may be absent or patient reported.   Sheral Flow, LPN  Subjective:   Brandi Ferguson is a 76 y.o. female who presents for Medicare Annual (Subsequent) preventive examination.  Review of Systems     Cardiac Risk Factors include: advanced age (>53mn, >>9women);dyslipidemia;family history of premature cardiovascular disease;hypertension;sedentary lifestyle     Objective:    Today's Vitals   09/02/22 0848 09/02/22 0900  Height: '5\' 2"'$  (1.575 m)   PainSc: 0-No pain 0-No pain   Body mass index is 29.26 kg/m.     09/02/2022    8:50 AM 08/30/2021   10:06 AM  Advanced Directives  Does Patient Have a Medical Advance Directive? Yes Yes  [place] of [place];Healthcare Power of Attorney  Does patient want to make changes to medical advance directive?  No - Patient declined  [place] of [place]? No - copy requested No - copy requested    Current Medications (verified) Outpatient Encounter Medications as of 09/02/2022  Medication Sig   ALPRAZolam (XANAX) 0.5 MG tablet  TAKE 1 TABLET BY MOUTH THREE TIMES A DAY AS NEEDED   fluticasone (FLONASE) 50 MCG/ACT nasal spray Place 2 sprays into both nostrils daily.   hydrochlorothiazide (HYDRODIURIL) 25 MG tablet TAKE 1 TABLET BY MOUTH EVERY DAY   hyoscyamine (ANASPAZ) 0.125 MG TBDP disintergrating tablet Place 1 tablet (0.125 mg total) under the tongue every 6 (six) hours as needed.   levothyroxine (SYNTHROID) 88 MCG tablet Take 1 tablet (88 mcg total) by mouth daily before breakfast. Annual appt due in Nov must see provider for future refills.   lovastatin (MEVACOR) 40 MG tablet Take 1 tablet (40 mg total) by mouth at bedtime.   omeprazole (PRILOSEC) 20 MG capsule Take 1 capsule (20 mg total) by mouth daily.   [DISCONTINUED] aspirin 81 MG tablet Take 1 tablet (81 mg total) by mouth daily. (Patient not taking: Reported on 07/02/2022)   [DISCONTINUED] buPROPion (WELLBUTRIN) 75 MG tablet Take 1 tablet (75 mg total) by mouth daily. (Patient not taking: Reported on 07/02/2022)   No facility-administered encounter medications on file as of 09/02/2022.    Allergies (verified) Patient has no known allergies.   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   ANXIETY DEPRESSION 02/06/2008   Cough 09/27/2008   DIVERTICULOSIS OF COLON 02/06/2008   HYPERLIPIDEMIA 06/27/2008   HYPERTENSION 02/06/2008   HYPOTHYROIDISM 02/06/2008   Irritable bowel syndrome 02/06/2008   Lump or mass in breast 02/06/2008   Overweight(278.02) 06/27/2008   SINUSITIS- ACUTE-NOS 01/04/2010   URI 09/04/2009   Past Surgical History:  Procedure Laterality Date   BREAST EXCISIONAL BIOPSY Right 1984   BREAST  SURGERY  1984   Right lumpectomy- Benign   CERVICAL CONE BIOPSY  1977   LAPAROSCOPY  1972   Fertility work-up   SKIN LESION EXCISION  2006   Left breast   TONSILLECTOMY     Family History  Problem Relation Age of Onset   Alzheimer's disease Mother    Alcohol abuse Father    Diabetes Father     Hyperlipidemia Father    Hypertension Father    Coronary artery disease Father    Cancer Sister    Social History   Socioeconomic History   Marital status: Married    Spouse name: Barnabas Lister   Number of children: 1   Years of education: 16   Highest education level: Not on file  Occupational History   Occupation: Automotive engineer  Tobacco Use   Smoking status: Never   Smokeless tobacco: Never  Substance and Sexual Activity   Alcohol use: No   Drug use: No   Sexual activity: Yes    Partners: Male  Other Topics Concern   Not on file  Social History Narrative   HSG, Completed [employer] -ed. Married "84", 1 adopted daughter "71". 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 Marriage in [place]. 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 Prolonged artificial hydration or nutrition.    Social Determinants of Health   Financial Resource Strain: Low Risk  (09/02/2022)   Overall Financial Resource Strain (CARDIA)    Difficulty of Paying Living Expenses: Not hard at all  Food Insecurity: No Food Insecurity (09/02/2022)   Hunger Vital Sign    Worried About Running Out of Food in the Last Year: Never true    Ran Out of Food in the Last Year: Never true  Transportation Needs: No Transportation Needs (09/02/2022)   PRAPARE - Hydrologist (Medical): No    Lack of Transportation (Non-Medical): No  Physical Activity: Insufficiently Active (09/02/2022)   Exercise Vital Sign    Days of Exercise per Week: 2 days    Minutes of Exercise per Session: 30 min  Stress: No Stress Concern Present (09/02/2022)   [place]    Feeling of Stress : Only a little  Social Connections: Unknown (09/02/2022)   Social Connection and Isolation Panel [NHANES]    Frequency of Communication with  Friends and Family: More than three times a week    Frequency of Social Gatherings with Friends and Family: Once a week    Attends Religious Services: Patient refused    Marine scientist or Organizations: No    Attends Archivist Meetings: Never    Marital Status: Married    Tobacco Counseling Counseling given: Not Answered   Clinical Intake:  Pre-visit preparation completed: Yes  Pain : No/denies pain Pain Score: 0-No pain     BMI - recorded: 29.26 (07/02/2022) Nutritional Status: BMI 25 -29 Overweight Nutritional Risks: None Diabetes: No  How often do you need to have someone help you when you read instructions, pamphlets, or other written materials from your doctor or pharmacy?: 1 - Never What is the last grade level you completed in school?: Bachelor's Degree  Diabetic? no  Interpreter Needed?: No  Information entered by :: Lisette Abu, LPN.   Activities of Daily Living    09/02/2022    9:06  AM 08/29/2022    9:17 AM  In your present state of health, do you have any difficulty performing the following activities:  Hearing? 1 1  Vision? 0 0  Difficulty concentrating or making decisions? 0 0  Walking or climbing stairs? 0 0  Dressing or bathing? 0 0  Doing errands, shopping? 0 0  Preparing Food and eating ? N N  Using the Toilet? N N  In the past six months, have you accidently leaked urine? N N  Do you have problems with loss of bowel control? N N  Managing your Medications? N N  Managing your Finances? N N  Housekeeping or managing your Housekeeping? N N    Patient Care Team: Hoyt Koch, MD as PCP - General (Internal Medicine) Druscilla Brownie, MD as Consulting Physician (Dermatology) Earlie Server, MD (Orthopedic Surgery) Gus Height, MD (Inactive) as Consulting Physician (Obstetrics and Gynecology) Iran Ouch, MD as Referring Physician (Ophthalmology) Darleen Crocker, MD as Consulting Physician  (Ophthalmology) Joseph Art, OD as Consulting Physician (Optometry)  Indicate any recent Medical Services you may have received from other than [facility] providers in the past year (date may be approximate).     Assessment:   This is a routine wellness examination for [place].  Hearing/Vision screen Hearing Screening - Comments:: Patient wears hearing aids. Vision Screening - Comments:: Wears rx glasses - up to date with routine eye exams with Dr. Joseph Art   Dietary issues and exercise activities discussed: Current Exercise Habits: Home exercise routine, Type of exercise: walking, Time (Minutes): 30, Frequency (Times/Week): 2, Weekly Exercise (Minutes/Week): 60, Exercise limited by: None identified   Goals Addressed               This Visit's Progress     My goal is to get back to [facility] and increase physical activity. (pt-stated)        Depression Screen    09/02/2022    8:56 AM 08/31/2021    9:01 AM 08/30/2021   10:13 AM 08/29/2020    8:04 AM 07/06/2019    9:33 AM 07/06/2019    9:03 AM 07/02/2018    8:07 AM  PHQ 2/9 Scores  PHQ - 2 Score 0 0 0 0 1 0 0  PHQ- 9 Score  0  0 3      Fall Risk    09/02/2022    8:52 AM 08/29/2022    9:17 AM 08/30/2021   10:07 AM 08/29/2020    8:04 AM 07/06/2019    8:58 AM  Fall Risk   Falls in the past year? 0 0 0 0 0  Number falls in past yr: 0 0 0 0   Injury with Fall? 0 0 0 0   Risk for fall due to : No Fall Risks  No Fall Risks    Follow up Falls prevention discussed  Falls evaluation completed      FALL RISK PREVENTION PERTAINING TO THE HOME:  Any stairs in or around the home? No  If so, are there any without handrails? No  Home free of loose throw rugs in walkways, pet beds, electrical cords, etc? Yes  Adequate lighting in your home to reduce risk of falls? Yes   ASSISTIVE DEVICES UTILIZED TO PREVENT FALLS:  Life alert? No  Use of a cane, walker or w/c? No  Grab bars in the bathroom? Yes  Shower chair or bench in  shower? Yes  Elevated toilet seat or a handicapped toilet? Yes  TIMED UP AND GO:  Was the test performed? No . Phone Visit   Cognitive Function:        09/02/2022    9:07 AM  6CIT Screen  What Year? 0 points  What month? 0 points  What time? 0 points  Count back from 20 0 points  Months in reverse 0 points  Repeat phrase 0 points  Total Score 0 points    Immunizations Immunization History  Administered Date(s) Administered   Fluad Quad(high Dose 65+) 07/06/2019, 08/29/2020, 08/13/2021, 07/02/2022   Influenza, High Dose Seasonal PF 07/01/2017, 07/02/2018, 08/13/2021   PFIZER(Purple Top)SARS-COV-2 Vaccination 11/28/2019, 12/21/2019, 09/21/2020   PNEUMOCOCCAL CONJUGATE-20 08/31/2021   Td 06/27/2008   Unspecified SARS-COV-2 Vaccination 11/15/2019    TDAP status: Due, Education has been provided regarding the importance of this vaccine. Advised may receive this vaccine at local pharmacy or Health Dept. Aware to provide a copy of the vaccination record if obtained from local pharmacy or Health Dept. Verbalized acceptance and understanding.  Flu Vaccine status: Up to date  Pneumococcal vaccine status: Up to date  Covid-19 vaccine status: Completed vaccines  Qualifies for Shingles Vaccine? Yes   Zostavax completed Yes   Shingrix Completed?: No.    Education has been provided regarding the importance of this vaccine. Patient has been advised to call insurance company to determine out of pocket expense if they have not yet received this vaccine. Advised may also receive vaccine at local pharmacy or Health Dept. Verbalized acceptance and understanding.  Screening Tests Health Maintenance  Topic Date Due   Hepatitis C Screening  Never done   Zoster Vaccines- Shingrix (1 of 2) Never done   COVID-19 Vaccine (5 - Mixed Product risk series) 11/16/2020   Medicare Annual Wellness (AWV)  09/03/2023   MAMMOGRAM  09/20/2023   COLONOSCOPY (Pts 45-38yr Insurance coverage will need  to be confirmed)  05/15/2025   Pneumonia Vaccine 76 Years old  Completed   INFLUENZA VACCINE  Completed   DEXA SCAN  Completed   HPV VACCINES  Aged Out    Health Maintenance  Health Maintenance Due  Topic Date Due   Hepatitis C Screening  Never done   Zoster Vaccines- Shingrix (1 of 2) Never done   COVID-19 Vaccine (5 - Mixed Product risk series) 11/16/2020    Colorectal cancer screening: Type of screening: Colonoscopy. Completed 07/14/2020. Repeat every 10 years  Mammogram status: Completed 09/19/2021. Repeat every year   Lung Cancer Screening: (Low Dose CT Chest recommended if Age 220-80years, 30 pack-year currently smoking OR have quit w/in 15years.) does not qualify.   Lung Cancer Screening Referral: no  Additional Screening:  Hepatitis C Screening: does qualify; Completed no  Vision Screening: Recommended annual ophthalmology exams for early detection of glaucoma and other disorders of the eye. Is the patient up to date with their annual eye exam?  Yes  Who is the provider or what is the name of the office in which the patient attends annual eye exams? Ritesh Poudyal, OD. If pt is not established with a provider, would they like to be referred to a provider to establish care? No .   Dental Screening: Recommended annual dental exams for proper oral hygiene  Community Resource Referral / Chronic Care Management: CRR required this visit?  No   CCM required this visit?  No      Plan:     I have personally reviewed and noted the following in the patient's chart:   Medical and social  history Use of alcohol, tobacco or illicit drugs  Current medications and supplements including opioid prescriptions. Patient is not currently taking opioid prescriptions. Functional ability and status Nutritional status Physical activity Advanced directives List of other physicians Hospitalizations, surgeries, and ER visits in previous 12 months Vitals Screenings to include  cognitive, depression, and falls Referrals and appointments  In addition, I have reviewed and discussed with patient certain preventive protocols, quality metrics, and best practice recommendations. A written personalized care plan for preventive services as well as general preventive health recommendations were provided to patient.     Sheral Flow, LPN   [id number]   Nurse Notes: N/A

## 2022-09-02 NOTE — Patient Instructions (Signed)
Brandi Ferguson , Thank you for taking time to come for your Medicare Wellness Visit. I appreciate your ongoing commitment to your health goals. Please review the following plan we discussed and let me know if I can assist you in the future.   These are the goals we discussed:  Goals       My goal is to get back to [facility] and increase physical activity. (pt-stated)        This is a list of the screening recommended for you and due dates:  Health Maintenance  Topic Date Due   Hepatitis C Screening: USPSTF Recommendation to screen - Ages 84-79 yo.  Never done   Zoster (Shingles) Vaccine (1 of 2) Never done   COVID-19 Vaccine (5 - Mixed Product risk series) 11/16/2020   Medicare Annual Wellness Visit  09/03/2023   Mammogram  09/20/2023   Colon Cancer Screening  05/15/2025   Pneumonia Vaccine  Completed   Flu Shot  Completed   DEXA scan (bone density measurement)  Completed   HPV Vaccine  Aged Out    Advanced directives: Yes; Please bring a copy of your health care power of attorney and living will to the office at your convenience.  Conditions/risks identified: Yes; Increase physical activity.  Next appointment: Follow up in one year for your annual wellness visit on 09/04/2023 at 8:45 a.m. telephone visit with Mignon Pine, Nurse Health Advisor.  If you need to reschedule or cancel, please call [phone].   Preventive Care 38 Years and Older, Female Preventive care refers to lifestyle choices and visits with your health care provider that can promote health and wellness. What does preventive care include? A yearly physical exam. This is also called an annual well check. Dental exams once or twice a year. Routine eye exams. Ask your health care provider how often you should have your eyes checked. Personal lifestyle choices, including: Daily care of your teeth and gums. Regular physical activity. Eating a healthy diet. Avoiding tobacco and drug use. Limiting alcohol use. Practicing  safe sex. Taking low-dose aspirin every day. Taking vitamin and mineral supplements as recommended by your health care provider. What happens during an annual well check? The services and screenings done by your health care provider during your annual well check will depend on your age, overall health, lifestyle risk factors, and family history of disease. Counseling  Your health care provider may ask you questions about your: Alcohol use. Tobacco use. Drug use. Emotional well-being. Home and relationship well-being. Sexual activity. Eating habits. History of falls. Memory and ability to understand (cognition). Work and work Statistician. Reproductive health. Screening  You may have the following tests or measurements: Height, weight, and BMI. Blood pressure. Lipid and cholesterol levels. These may be checked every 5 years, or more frequently if you are over 39 years old. Skin check. Lung cancer screening. You may have this screening every year starting at age 66 if you have a 30-pack-year history of smoking and currently smoke or have quit within the past 15 years. Fecal occult blood test (FOBT) of the stool. You may have this test every year starting at age 33. Flexible sigmoidoscopy or colonoscopy. You may have a sigmoidoscopy every 5 years or a colonoscopy every 10 years starting at age 70. Hepatitis C blood test. Hepatitis B blood test. Sexually transmitted disease (STD) testing. Diabetes screening. This is done by checking your blood sugar (glucose) after you have not eaten for a while (fasting). You may have this done every  1-3 years. Bone density scan. This is done to screen for osteoporosis. You may have this done starting at age 68. Mammogram. This may be done every 1-2 years. Talk to your health care provider about how often you should have regular mammograms. Talk with your health care provider about your test results, treatment options, and if necessary, the need for more  tests. Vaccines  Your health care provider may recommend certain vaccines, such as: Influenza vaccine. This is recommended every year. Tetanus, diphtheria, and acellular pertussis (Tdap, Td) vaccine. You may need a Td booster every 10 years. Zoster vaccine. You may need this after age 41. Pneumococcal 13-valent conjugate (PCV13) vaccine. One dose is recommended after age 87. Pneumococcal polysaccharide (PPSV23) vaccine. One dose is recommended after age 90. Talk to your health care provider about which screenings and vaccines you need and how often you need them. This information is not intended to replace advice given to you by your health care provider. Make sure you discuss any questions you have with your health care provider. Document Released: 10/27/2015 Document Revised: 06/19/2016 Document Reviewed: 08/01/2015 Elsevier Interactive Patient Education  2017 [place] Prevention in the Home Falls can cause injuries. They can happen to people of all ages. There are many things you can do to make your home safe and to help prevent falls. What can I do on the outside of my home? Regularly fix the edges of walkways and driveways and fix any cracks. Remove anything that might make you trip as you walk through a door, such as a raised step or threshold. Trim any bushes or trees on the path to your home. Use bright outdoor lighting. Clear any walking paths of anything that might make someone trip, such as rocks or tools. Regularly check to see if handrails are loose or broken. Make sure that both sides of any steps have handrails. Any raised decks and porches should have guardrails on the edges. Have any leaves, snow, or ice cleared regularly. Use sand or salt on walking paths during winter. Clean up any spills in your garage right away. This includes oil or grease spills. What can I do in the bathroom? Use night lights. Install grab bars by the toilet and in the tub and shower.  Do not use towel bars as grab bars. Use non-skid mats or decals in the tub or shower. If you need to sit down in the shower, use a plastic, non-slip stool. Keep the floor dry. Clean up any water that spills on the floor as soon as it happens. Remove soap buildup in the tub or shower regularly. Attach bath mats securely with double-sided non-slip rug tape. Do not have throw rugs and other things on the floor that can make you trip. What can I do in the bedroom? Use night lights. Make sure that you have a light by your bed that is easy to reach. Do not use any sheets or blankets that are too big for your bed. They should not hang down onto the floor. Have a firm chair that has side arms. You can use this for support while you get dressed. Do not have throw rugs and other things on the floor that can make you trip. What can I do in the kitchen? Clean up any spills right away. Avoid walking on wet floors. Keep items that you use a lot in easy-to-reach places. If you need to reach something above you, use a strong step stool that has a  grab bar. Keep electrical cords out of the way. Do not use floor polish or wax that makes floors slippery. If you must use wax, use non-skid floor wax. Do not have throw rugs and other things on the floor that can make you trip. What can I do with my stairs? Do not leave any items on the stairs. Make sure that there are handrails on both sides of the stairs and use them. Fix handrails that are broken or loose. Make sure that handrails are as long as the stairways. Check any carpeting to make sure that it is firmly attached to the stairs. Fix any carpet that is loose or worn. Avoid having throw rugs at the top or bottom of the stairs. If you do have throw rugs, attach them to the floor with carpet tape. Make sure that you have a light switch at the top of the stairs and the bottom of the stairs. If you do not have them, ask someone to add them for you. What else  can I do to help prevent falls? Wear shoes that: Do not have high heels. Have rubber bottoms. Are comfortable and fit you well. Are closed at the toe. Do not wear sandals. If you use a stepladder: Make sure that it is fully opened. Do not climb a closed stepladder. Make sure that both sides of the stepladder are locked into place. Ask someone to hold it for you, if possible. Clearly mark and make sure that you can see: Any grab bars or handrails. First and last steps. Where the edge of each step is. Use tools that help you move around (mobility aids) if they are needed. These include: Canes. Walkers. Scooters. Crutches. Turn on the lights when you go into a dark area. Replace any light bulbs as soon as they burn out. Set up your furniture so you have a clear path. Avoid moving your furniture around. If any of your floors are uneven, fix them. If there are any pets around you, be aware of where they are. Review your medicines with your doctor. Some medicines can make you feel dizzy. This can increase your chance of falling. Ask your doctor what other things that you can do to help prevent falls. This information is not intended to replace advice given to you by your health care provider. Make sure you discuss any questions you have with your health care provider. Document Released: 07/27/2009 Document Revised: 03/07/2016 Document Reviewed: 11/04/2014 Elsevier Interactive Patient Education  2017 [employer].

## 2022-09-04 ENCOUNTER — Ambulatory Visit (INDEPENDENT_AMBULATORY_CARE_PROVIDER_SITE_OTHER): Payer: Medicare Other | Admitting: Internal Medicine

## 2022-09-04 ENCOUNTER — Encounter: Payer: Self-pay | Admitting: Internal Medicine

## 2022-09-04 VITALS — BP 120/60 | HR 76 | Temp 98.3°F | Ht 62.0 in | Wt 158.0 lb

## 2022-09-04 DIAGNOSIS — Z0000 Encounter for general adult medical examination without abnormal findings: Secondary | ICD-10-CM

## 2022-09-04 DIAGNOSIS — K582 Mixed irritable bowel syndrome: Secondary | ICD-10-CM

## 2022-09-04 DIAGNOSIS — E039 Hypothyroidism, unspecified: Secondary | ICD-10-CM

## 2022-09-04 DIAGNOSIS — E782 Mixed hyperlipidemia: Secondary | ICD-10-CM | POA: Diagnosis not present

## 2022-09-04 DIAGNOSIS — I10 Essential (primary) hypertension: Secondary | ICD-10-CM

## 2022-09-04 DIAGNOSIS — F325 Major depressive disorder, single episode, in full remission: Secondary | ICD-10-CM

## 2022-09-04 LAB — COMPREHENSIVE METABOLIC PANEL
ALT: 10 U/L (ref 0–35)
AST: 23 U/L (ref 0–37)
Albumin: 4 g/dL (ref 3.5–5.2)
Alkaline Phosphatase: 58 U/L (ref 39–117)
BUN: 7 mg/dL (ref 6–23)
CO2: 30 mEq/L (ref 19–32)
Calcium: 9.6 mg/dL (ref 8.4–10.5)
Chloride: 103 mEq/L (ref 96–112)
Creatinine, Ser: 0.76 mg/dL (ref 0.40–1.20)
GFR: 77.38 mL/min (ref >60.00)
Glucose, Bld: 88 mg/dL (ref 70–99)
Potassium: 3.5 mEq/L (ref 3.5–5.1)
Sodium: 141 mEq/L (ref 135–145)
Total Bilirubin: 0.7 mg/dL (ref 0.2–1.2)
Total Protein: 7 g/dL (ref 6.0–8.3)

## 2022-09-04 LAB — LIPID PANEL
Cholesterol: 111 mg/dL (ref 0–200)
HDL: 39.4 mg/dL (ref >39.00)
LDL Cholesterol: 48 mg/dL (ref 0–99)
NonHDL: 71.83
Total CHOL/HDL Ratio: 3
Triglycerides: 121 mg/dL (ref 0.0–149.0)
VLDL: 24.2 mg/dL (ref 0.0–40.0)

## 2022-09-04 LAB — CBC
HCT: 41.2 % (ref 36.0–46.0)
Hemoglobin: 14.5 g/dL (ref 12.0–15.0)
MCHC: 35.2 g/dL (ref 30.0–36.0)
MCV: 87.8 fl (ref 78.0–100.0)
Platelets: 350 10*3/uL (ref 150.0–400.0)
RBC: 4.69 Mil/uL (ref 3.87–5.11)
RDW: 13.8 % (ref 11.5–15.5)
WBC: 8.3 10*3/uL (ref 4.0–10.5)

## 2022-09-04 LAB — TSH: TSH: 0.07 u[IU]/mL — ABNORMAL LOW (ref 0.35–5.50)

## 2022-09-04 LAB — T4, FREE: Free T4: 1.35 ng/dL (ref 0.60–1.60)

## 2022-09-04 MED ORDER — ALPRAZOLAM 0.5 MG PO TABS: 0.5000 mg | ORAL_TABLET | Freq: Three times a day (TID) | ORAL | 5 refills | Status: DC | PRN

## 2022-09-04 NOTE — Assessment & Plan Note (Signed)
Doing well with hyoscyamine and using EMMA dietary supplement with some positive results in last 2 weeks.

## 2022-09-04 NOTE — Assessment & Plan Note (Signed)
Flu shot up to date. Covid-19 counseled. Pneumonia complete. Shingrix counseled. Colonoscopy up to date. Mammogram up to date, pap smear aged out and dexa up to date. Counseled about sun safety and mole surveillance. Counseled about the dangers of distracted driving. Given 10 year screening recommendations.

## 2022-09-04 NOTE — Assessment & Plan Note (Signed)
Checking lipid panel and CMP and adjust lovastatin 40 mg daily as needed.

## 2022-09-04 NOTE — Assessment & Plan Note (Signed)
Using alprazolam 0.5 mg TID and refilled today. Overall stable and well controlled.

## 2022-09-04 NOTE — Assessment & Plan Note (Signed)
BP at goal will check CMP and adjust hctz 25 mg daily as needd.

## 2022-09-04 NOTE — Progress Notes (Signed)
   Subjective:   Patient ID: Brandi Ferguson, female    DOB: 06-Dec-1945, 76 y.o.   MRN: [id number]  HPI The patient is here for physical.  PMH, [facility], social history reviewed and updated  Review of Systems  Constitutional: Negative.   HENT: Negative.    Eyes: Negative.   Respiratory:  Negative for cough, chest tightness and shortness of breath.   Cardiovascular:  Negative for chest pain, palpitations and leg swelling.  Gastrointestinal:  Negative for abdominal distention, abdominal pain, constipation, diarrhea, nausea and vomiting.  Musculoskeletal: Negative.   Skin: Negative.   Neurological: Negative.   Psychiatric/Behavioral: Negative.      Objective:  Physical Exam Constitutional:      Appearance: She is well-developed.  HENT:     Head: Normocephalic and atraumatic.  Cardiovascular:     Rate and Rhythm: Normal rate and regular rhythm.  Pulmonary:     Effort: Pulmonary effort is normal. No respiratory distress.     Breath sounds: Normal breath sounds. No wheezing or rales.  Abdominal:     General: Bowel sounds are normal. There is no distension.     Palpations: Abdomen is soft.     Tenderness: There is no abdominal tenderness. There is no rebound.  Musculoskeletal:     Cervical back: Normal range of motion.  Skin:    General: Skin is warm and dry.  Neurological:     Mental Status: She is alert and oriented to person, place, and time.     Coordination: Coordination normal.     Vitals:   09/04/22 0814  BP: 120/60  Pulse: 76  Temp: 98.3 F (36.8 C)  TempSrc: Oral  SpO2: 99%  Weight: 158 lb (71.7 kg)  Height: '5\' 2"'$  (1.575 m)    Assessment & Plan:

## 2022-09-04 NOTE — Assessment & Plan Note (Signed)
Checking TSH and free T4 and adjust synthroid 88 mcg daily as needed.

## 2022-09-20 ENCOUNTER — Ambulatory Visit
Admission: RE | Admit: 2022-09-20 | Discharge: 2022-09-20 | Disposition: A | Discharge status: Home / Self Care | Payer: Medicare Other | Source: Ambulatory Visit | Attending: Internal Medicine | Admitting: Internal Medicine

## 2022-09-20 DIAGNOSIS — Z1231 Encounter for screening mammogram for malignant neoplasm of breast: Secondary | ICD-10-CM | POA: Diagnosis not present

## 2022-10-20 ENCOUNTER — Other Ambulatory Visit: Payer: Self-pay | Admitting: Internal Medicine

## 2022-12-11 DIAGNOSIS — H43813 Vitreous degeneration, bilateral: Secondary | ICD-10-CM | POA: Diagnosis not present

## 2022-12-26 ENCOUNTER — Other Ambulatory Visit: Payer: Self-pay | Admitting: Internal Medicine

## 2023-01-22 DIAGNOSIS — H43813 Vitreous degeneration, bilateral: Secondary | ICD-10-CM | POA: Diagnosis not present

## 2023-03-22 ENCOUNTER — Other Ambulatory Visit: Payer: Self-pay | Admitting: Internal Medicine

## 2023-03-25 ENCOUNTER — Other Ambulatory Visit: Payer: Self-pay | Admitting: Internal Medicine

## 2023-03-27 ENCOUNTER — Other Ambulatory Visit: Payer: Self-pay | Admitting: Internal Medicine

## 2023-03-29 IMAGING — MG MM DIGITAL SCREENING BILAT W/ TOMO AND CAD
6 of 10 series · 6 of 30 positions shown · non-contrast
Comparison: Previous exam(s).

CLINICAL DATA: Screening.

EXAM:
DIGITAL SCREENING BILATERAL MAMMOGRAM WITH TOMOSYNTHESIS AND CAD
TECHNIQUE: Bilateral screening digital craniocaudal and mediolateral oblique
mammograms were obtained. Bilateral screening digital breast
tomosynthesis was performed. The images were evaluated with
computer-aided detection.

[L CC synth-2D]
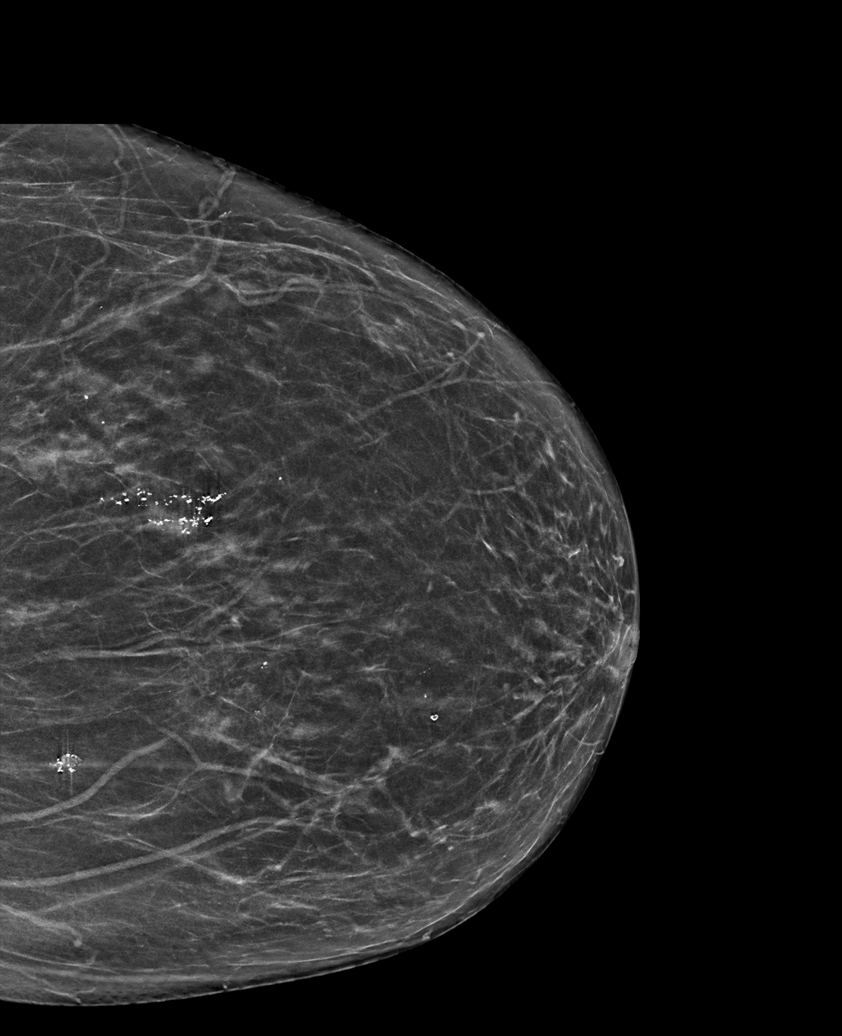

[L MLO synth-2D (1 of 2)]
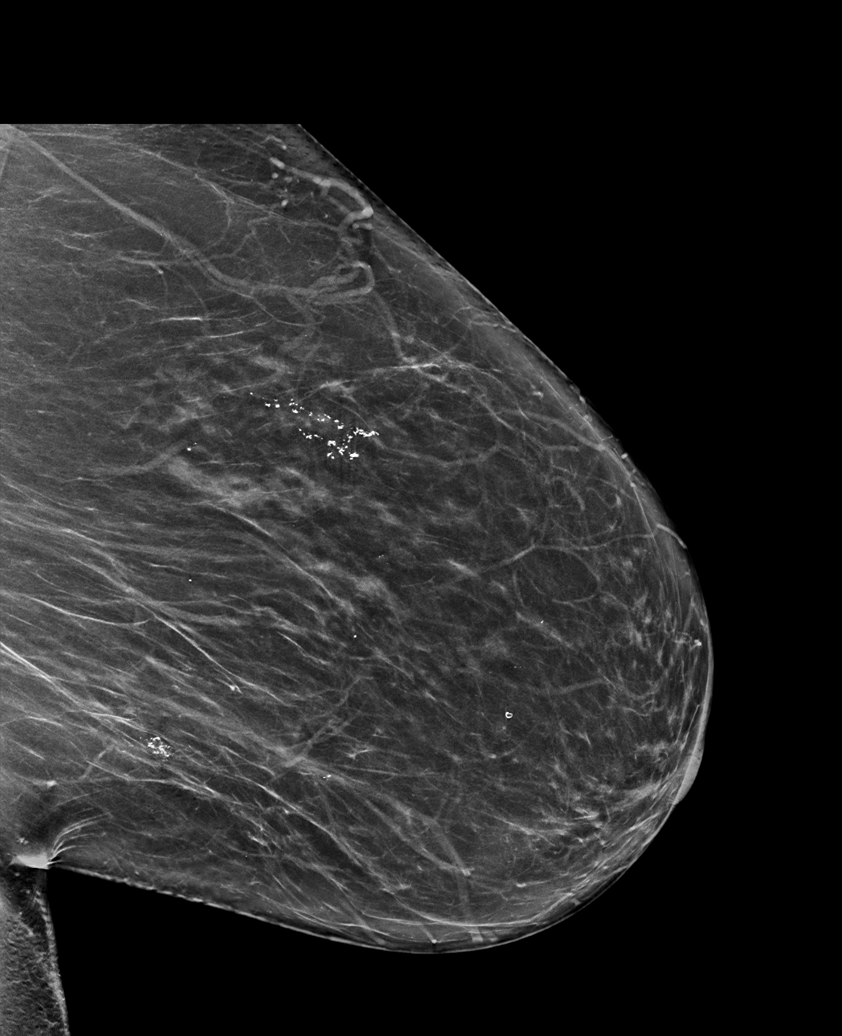

[L MLO synth-2D (2 of 2)]
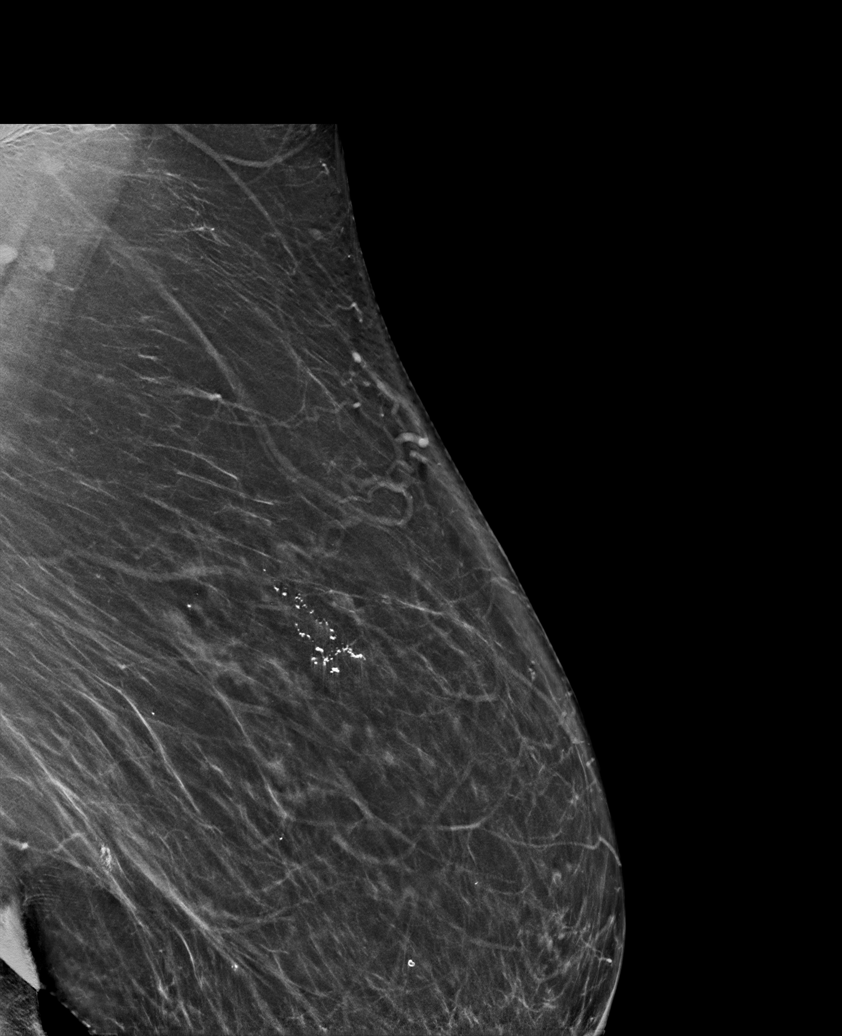

[R MLO synth-2D]
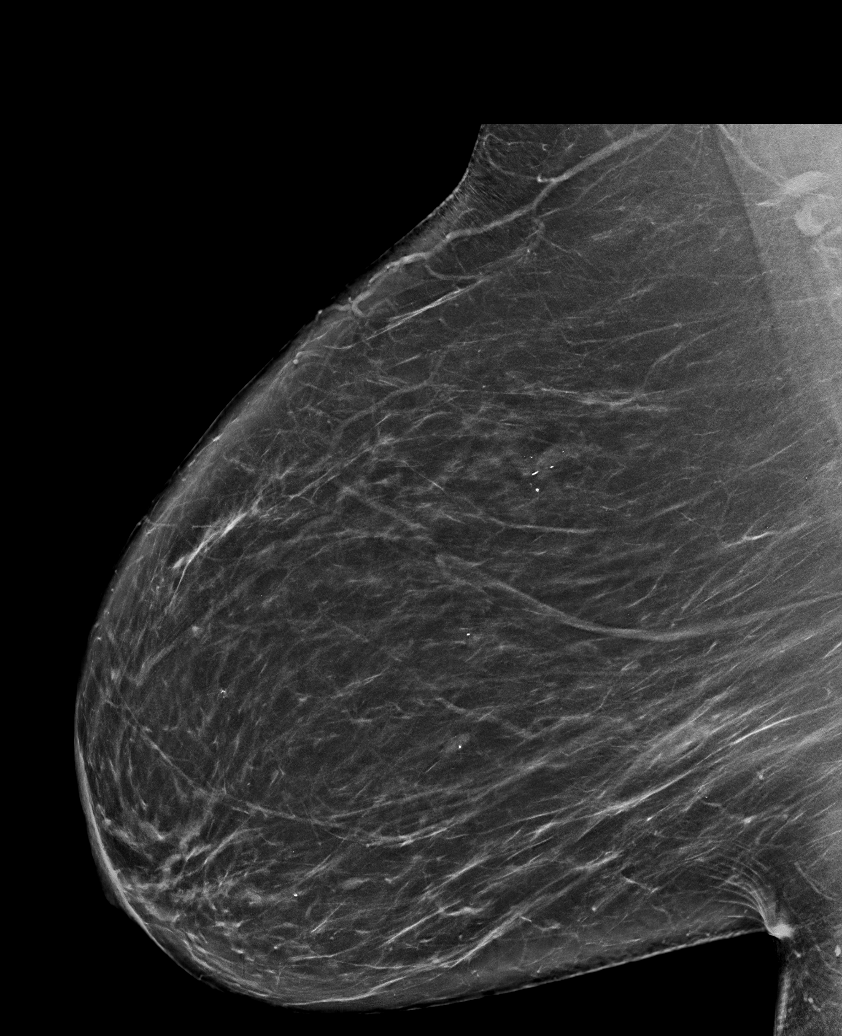

[R CC synth-2D]
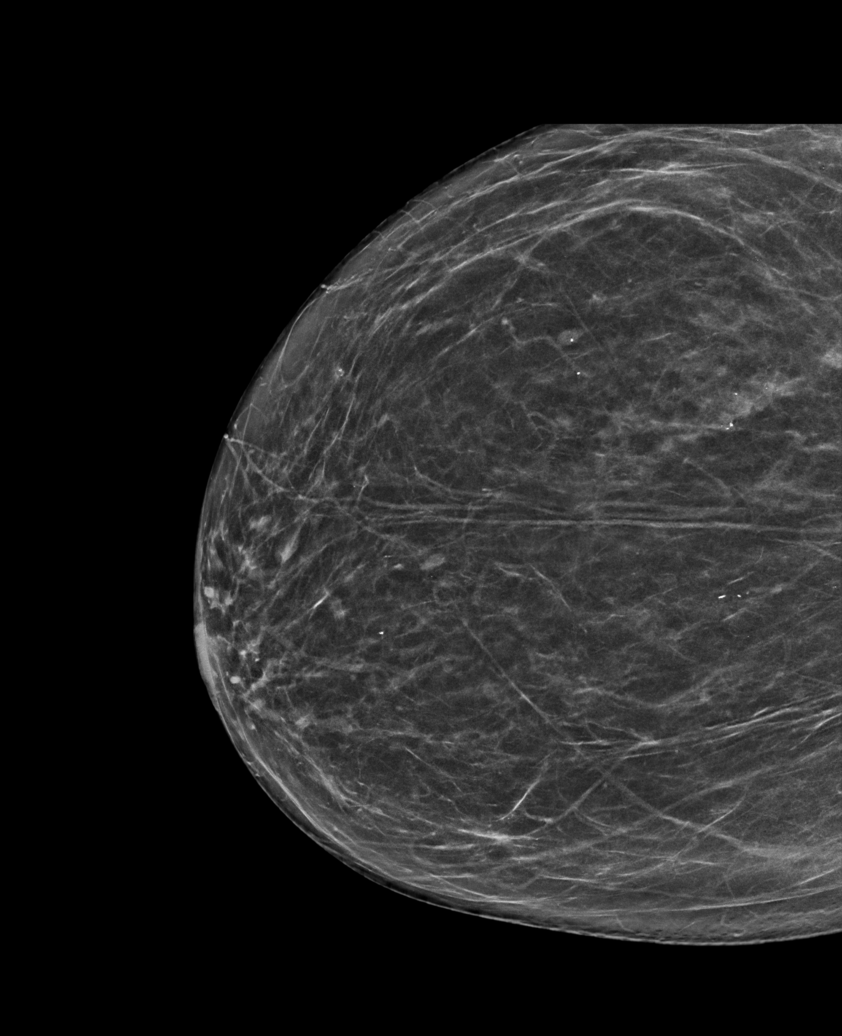

[L MLO tomo · tomo slice 41/82.0]
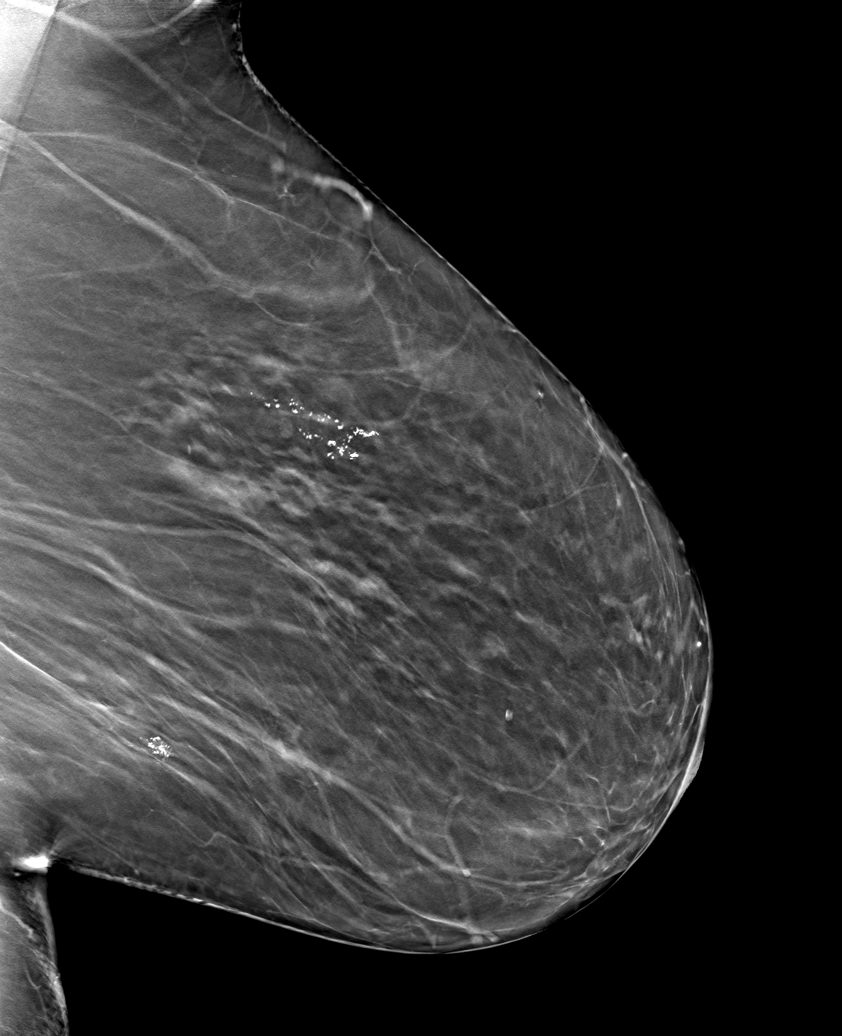

[6 of 30 positions shown; findings below may reference images not displayed]

ACR Breast Density Category b: There are scattered areas of
fibroglandular density.
FINDINGS: There are no findings suspicious for malignancy.
IMPRESSION: No mammographic evidence of malignancy. A result letter of this
screening mammogram will be mailed directly to the patient.

RECOMMENDATION:
Screening mammogram in one year. (Code:51-O-LD2)

BI-RADS CATEGORY  1: Negative.

## 2023-05-07 DIAGNOSIS — R350 Frequency of micturition: Secondary | ICD-10-CM | POA: Diagnosis not present

## 2023-05-07 DIAGNOSIS — Z6826 Body mass index (BMI) 26.0-26.9, adult: Secondary | ICD-10-CM | POA: Diagnosis not present

## 2023-05-07 DIAGNOSIS — I10 Essential (primary) hypertension: Secondary | ICD-10-CM | POA: Diagnosis not present

## 2023-05-07 DIAGNOSIS — N3001 Acute cystitis with hematuria: Secondary | ICD-10-CM | POA: Diagnosis not present

## 2023-05-15 ENCOUNTER — Other Ambulatory Visit: Payer: Self-pay | Admitting: Internal Medicine

## 2023-05-16 ENCOUNTER — Other Ambulatory Visit: Payer: Self-pay

## 2023-05-16 ENCOUNTER — Telehealth: Payer: Self-pay | Admitting: Internal Medicine

## 2023-05-16 MED ORDER — HYOSCYAMINE SULFATE 0.125 MG PO TBDP: 0.1250 mg | ORAL_TABLET | Freq: Four times a day (QID) | ORAL | 0 refills | Status: DC | PRN

## 2023-05-16 NOTE — Telephone Encounter (Signed)
Patient requested a refill of hyoscyamine (ANASPAZ) 0.125 MG TBDP disintergrating tablet from her pharmacy and it was denied. The reason said the request was too soon, but the patient said she had about 10 pills left. She would like to know what Dr. Okey Dupre would like her to do. Best callback is [phone].

## 2023-05-16 NOTE — Telephone Encounter (Signed)
Sent in

## 2023-05-26 DIAGNOSIS — L820 Inflamed seborrheic keratosis: Secondary | ICD-10-CM | POA: Diagnosis not present

## 2023-05-26 DIAGNOSIS — D1801 Hemangioma of skin and subcutaneous tissue: Secondary | ICD-10-CM | POA: Diagnosis not present

## 2023-05-26 DIAGNOSIS — L570 Actinic keratosis: Secondary | ICD-10-CM | POA: Diagnosis not present

## 2023-05-26 DIAGNOSIS — L814 Other melanin hyperpigmentation: Secondary | ICD-10-CM | POA: Diagnosis not present

## 2023-05-26 DIAGNOSIS — D2371 Other benign neoplasm of skin of right lower limb, including hip: Secondary | ICD-10-CM | POA: Diagnosis not present

## 2023-05-26 DIAGNOSIS — L821 Other seborrheic keratosis: Secondary | ICD-10-CM | POA: Diagnosis not present

## 2023-06-11 DIAGNOSIS — Z87898 Personal history of other specified conditions: Secondary | ICD-10-CM | POA: Diagnosis not present

## 2023-06-11 DIAGNOSIS — Z01419 Encounter for gynecological examination (general) (routine) without abnormal findings: Secondary | ICD-10-CM | POA: Diagnosis not present

## 2023-06-11 DIAGNOSIS — R300 Dysuria: Secondary | ICD-10-CM | POA: Diagnosis not present

## 2023-06-11 DIAGNOSIS — Z09 Encounter for follow-up examination after completed treatment for conditions other than malignant neoplasm: Secondary | ICD-10-CM | POA: Diagnosis not present

## 2023-07-15 ENCOUNTER — Encounter: Payer: Self-pay | Admitting: Internal Medicine

## 2023-07-15 ENCOUNTER — Ambulatory Visit (INDEPENDENT_AMBULATORY_CARE_PROVIDER_SITE_OTHER): Payer: BC Managed Care – PPO | Admitting: Internal Medicine

## 2023-07-15 VITALS — BP 124/62 | HR 78 | Temp 98.2°F | Ht 62.0 in | Wt 141.0 lb

## 2023-07-15 DIAGNOSIS — I10 Essential (primary) hypertension: Secondary | ICD-10-CM | POA: Diagnosis not present

## 2023-07-15 DIAGNOSIS — Z23 Encounter for immunization: Secondary | ICD-10-CM

## 2023-07-15 DIAGNOSIS — K581 Irritable bowel syndrome with constipation: Secondary | ICD-10-CM

## 2023-07-15 NOTE — Progress Notes (Signed)
   Subjective:   Patient ID: Brandi Ferguson, female    DOB: 01-Jun-1946, 77 y.o.   MRN: [id number]  HPI The patient is a 77 YO female coming in for IBS flare and some lightheadedness.   Review of Systems  Constitutional: Negative.   HENT: Negative.    Eyes: Negative.   Respiratory:  Negative for cough, chest tightness and shortness of breath.   Cardiovascular:  Negative for chest pain, palpitations and leg swelling.  Gastrointestinal:  Positive for constipation and diarrhea. Negative for abdominal distention, abdominal pain, nausea and vomiting.  Musculoskeletal: Negative.   Skin: Negative.   Neurological:  Positive for light-headedness.  Psychiatric/Behavioral: Negative.      Objective:  Physical Exam Constitutional:      Appearance: She is well-developed.  HENT:     Head: Normocephalic and atraumatic.  Cardiovascular:     Rate and Rhythm: Normal rate and regular rhythm.  Pulmonary:     Effort: Pulmonary effort is normal. No respiratory distress.     Breath sounds: Normal breath sounds. No wheezing or rales.  Abdominal:     General: Bowel sounds are normal. There is no distension.     Palpations: Abdomen is soft.     Tenderness: There is no abdominal tenderness. There is no rebound.  Musculoskeletal:     Cervical back: Normal range of motion.  Skin:    General: Skin is warm and dry.  Neurological:     Mental Status: She is alert and oriented to person, place, and time.     Coordination: Coordination normal.     Vitals:   07/15/23 0819  BP: 124/62  Pulse: 78  Temp: 98.2 F (36.8 C)  TempSrc: Oral  SpO2: 98%  Weight: 141 lb (64 kg)  Height: 5\' 2"  (1.575 m)    Assessment & Plan:  Flu shot given at visit

## 2023-07-15 NOTE — Patient Instructions (Signed)
We will have you cut the hydrochlorothiazide in half (this is the water pill)

## 2023-07-16 ENCOUNTER — Other Ambulatory Visit: Payer: Self-pay | Admitting: Internal Medicine

## 2023-07-18 ENCOUNTER — Encounter: Payer: Self-pay | Admitting: Internal Medicine

## 2023-07-18 NOTE — Assessment & Plan Note (Signed)
Using EMMA dietary supplement and having flare recently due to stress. She would like to return to GI for evaluation.

## 2023-07-18 NOTE — Assessment & Plan Note (Signed)
With low normal BP at home and dizziness. Will reduce hydrochlorothiazide 25 mg to 12.5 mg daily (asked to cut in half). Report BP readings and symptoms in 1-2 weeks. If working well we will change dose.

## 2023-08-11 ENCOUNTER — Other Ambulatory Visit: Payer: Self-pay | Admitting: Internal Medicine

## 2023-08-18 ENCOUNTER — Other Ambulatory Visit: Payer: Self-pay | Admitting: Internal Medicine

## 2023-08-18 DIAGNOSIS — Z1231 Encounter for screening mammogram for malignant neoplasm of breast: Secondary | ICD-10-CM

## 2023-09-04 ENCOUNTER — Ambulatory Visit (INDEPENDENT_AMBULATORY_CARE_PROVIDER_SITE_OTHER): Payer: BC Managed Care – PPO

## 2023-09-04 VITALS — Ht 62.0 in | Wt 138.0 lb

## 2023-09-04 DIAGNOSIS — Z0000 Encounter for general adult medical examination without abnormal findings: Secondary | ICD-10-CM

## 2023-09-04 NOTE — Patient Instructions (Signed)
Ms. Chubbuck , Thank you for taking time to come for your Medicare Wellness Visit. I appreciate your ongoing commitment to your health goals. Please review the following plan we discussed and let me know if I can assist you in the future.   Referrals/Orders/Follow-Ups/Clinician Recommendations: You are due for a Hep C screening.  Keep up the good work.  This is a list of the screening recommended for you and due dates:  Health Maintenance  Topic Date Due   Hepatitis C Screening  09/08/2023*   DTaP/Tdap/Td vaccine (2 - Tdap) 09/03/2024*   COVID-19 Vaccine (6 - 2023-24 season) 09/18/2023   Medicare Annual Wellness Visit  09/03/2024   Colon Cancer Screening  05/15/2025   Pneumonia Vaccine  Completed   Flu Shot  Completed   DEXA scan (bone density measurement)  Completed   HPV Vaccine  Aged Out   Zoster (Shingles) Vaccine  Discontinued  *Topic was postponed. The date shown is not the original due date.    Advanced directives: (Copy Requested) Please bring a copy of your health care power of attorney and living will to the office to be added to your chart at your convenience.  Next Medicare Annual Wellness Visit scheduled for next year: No

## 2023-09-04 NOTE — Progress Notes (Signed)
Subjective:   Brandi Ferguson is a 77 y.o. female who presents for Medicare Annual (Subsequent) preventive examination.  Visit Complete: Virtual I connected with  Heydi D Hovanec on 09/04/23 by a audio enabled telemedicine application and verified that I am speaking with the correct person using two identifiers.  Patient Location: Home  Provider Location: Office/Clinic  I discussed the limitations of evaluation and management by telemedicine. The patient expressed understanding and agreed to proceed.  Vital Signs: Because this visit was a virtual/telehealth visit, some criteria may be missing or patient reported. Any vitals not documented were not able to be obtained and vitals that have been documented are patient reported.   Cardiac Risk Factors include: hypertension;advanced age (>68men, >46 women);dyslipidemia     Objective:    Today's Vitals   09/04/23 0810  Weight: 138 lb (62.6 kg)  Height: 5\' 2"  (1.575 m)   Body mass index is 25.24 kg/m.     09/04/2023    8:24 AM 09/02/2022    8:50 AM 08/30/2021   10:06 AM  Advanced Directives  Does Patient Have a Medical Advance Directive? Yes Yes Yes  Type of Estate agent of [place];Living will Healthcare Power of [place];Living will Living will;Healthcare Power of Attorney  Does patient want to make changes to medical advance directive?   No - Patient declined  Copy of Healthcare Power of Attorney in Chart? No - copy requested No - copy requested No - copy requested    Current Medications (verified) Outpatient Encounter Medications as of 09/04/2023  Medication Sig   ALPRAZolam (XANAX) 0.5 MG tablet TAKE 1 TABLET BY MOUTH 3 TIMES DAILY AS NEEDED.   hydrochlorothiazide (HYDRODIURIL) 25 MG tablet TAKE 1 TABLET BY MOUTH EVERY DAY   hyoscyamine (ANASPAZ) 0.125 MG TBDP disintergrating tablet Place 1 tablet (0.125 mg total) under the tongue every 6 (six) hours as needed.   levothyroxine (SYNTHROID) 88  MCG tablet TAKE 1 TABLET BY MOUTH DAILY BEFORE BREAKFAST. ANNUAL APPT DUE IN NOV MUST SEE PROVIDER FOR REFILLS   lovastatin (MEVACOR) 40 MG tablet TAKE 1 TABLET BY MOUTH EVERYDAY AT BEDTIME   omeprazole (PRILOSEC) 20 MG capsule TAKE 1 CAPSULE BY MOUTH EVERY DAY   No facility-administered encounter medications on file as of 09/04/2023.    Allergies (verified) Patient has no known allergies.   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   ANXIETY DEPRESSION 02/06/2008   Cough 09/27/2008   DIVERTICULOSIS OF COLON 02/06/2008   HYPERLIPIDEMIA 06/27/2008   HYPERTENSION 02/06/2008   HYPOTHYROIDISM 02/06/2008   Irritable bowel syndrome 02/06/2008   Lump or mass in breast 02/06/2008   Overweight(278.02) 06/27/2008   SINUSITIS- ACUTE-NOS 01/04/2010   URI 09/04/2009   Past Surgical History:  Procedure Laterality Date   BREAST EXCISIONAL BIOPSY Right 1984   BREAST SURGERY  1984   Right lumpectomy- Benign   CERVICAL CONE BIOPSY  1977   LAPAROSCOPY  1972   Fertility work-up   SKIN LESION EXCISION  2006   Left breast   TONSILLECTOMY     Family History  Problem Relation Age of Onset   Alzheimer's disease Mother    Alcohol abuse Father    Diabetes Father    Hyperlipidemia Father    Hypertension Father    Coronary artery disease Father    Cancer Sister    Social History   Socioeconomic History   Marital status: Married    Spouse name: Ree Kida   Number of  children: 1   Years of education: 16   Highest education level: Bachelor's degree (e.g., BA, AB, BS)  Occupational History   Occupation: Retired/ Horticulturist, commercial  Tobacco Use   Smoking status: Never   Smokeless tobacco: Never  Vaping Use   Vaping status: Never Used  Substance and Sexual Activity   Alcohol use: No   Drug use: No   Sexual activity: Yes    Partners: Male  Other Topics Concern   Not on file  Social History Narrative   HSG, Completed [employer] BA -ed. Married  "32", 1 adopted daughter "100". 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 Marriage in [place] [facility]. 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 Prolonged artificial hydration or nutrition.    Social Determinants of Health   Financial Resource Strain: Low Risk  (09/04/2023)   Overall Financial Resource Strain (CARDIA)    Difficulty of Paying Living Expenses: Not hard at all  Food Insecurity: No Food Insecurity (09/04/2023)   Hunger Vital Sign    Worried About Running Out of Food in the Last Year: Never true    Ran Out of Food in the Last Year: Never true  Transportation Needs: No Transportation Needs (09/04/2023)   PRAPARE - Administrator, Civil Service (Medical): No    Lack of Transportation (Non-Medical): No  Physical Activity: Insufficiently Active (09/04/2023)   Exercise Vital Sign    Days of Exercise per Week: 2 days    Minutes of Exercise per Session: 30 min  Stress: No Stress Concern Present (09/04/2023)   [employer] of Occupational Health - Occupational Stress Questionnaire    Feeling of Stress : Only a little  Recent Concern: Stress - Stress Concern Present (07/14/2023)   [employer] of Occupational Health - Occupational Stress Questionnaire    Feeling of Stress : To some extent  Social Connections: Moderately Integrated (09/04/2023)   Social Connection and Isolation Panel [NHANES]    Frequency of Communication with Friends and Family: More than three times a week    Frequency of Social Gatherings with Friends and Family: Never    Attends Religious Services: More than 4 times per year    Active Member of [employer] or Organizations: No    Attends Engineer, structural: Never    Marital Status: Married    Tobacco Counseling Counseling given: Not Answered   Clinical Intake:  Pre-visit preparation completed: Yes  Pain : No/denies  pain     BMI - recorded: 25.24 Nutritional Status: BMI 25 -29 Overweight Nutritional Risks: None Diabetes: No  How often do you need to have someone help you when you read instructions, pamphlets, or other written materials from your doctor or pharmacy?: 1 - Never  Interpreter Needed?: No  Information entered by :: Ronda Rajkumar, RMA   Activities of Daily Living    09/04/2023    8:11 AM  In your present state of health, do you have any difficulty performing the following activities:  Hearing? 1  Comment wears hearing aides  Vision? 0  Difficulty concentrating or making decisions? 0  Walking or climbing stairs? 0  Dressing or bathing? 0  Preparing Food and eating ? N  Using the Toilet? N  In the past six months, have you accidently leaked urine? N  Do you have problems with loss of bowel control? N  Managing your Medications? N  Managing your Finances? N  Housekeeping or managing your Housekeeping? N    Patient Care Team: Myrlene Broker, MD as PCP - General (Internal Medicine) Cherlyn Roberts, MD as Consulting Physician (Dermatology) Frederico Hamman, MD (Orthopedic Surgery) Miguel Aschoff, MD (Inactive) as Consulting Physician (Obstetrics and Gynecology) Caro Laroche, MD as Referring Physician (Ophthalmology) Norva Pavlov, OD as Consulting Physician (Optometry)  Indicate any recent Medical Services you may have received from other than [facility] providers in the past year (date may be approximate).     Assessment:   This is a routine wellness examination for [place].  Hearing/Vision screen Hearing Screening - Comments:: Wears hearing aides Vision Screening - Comments:: Wears eyeglasses for reading   Goals Addressed               This Visit's Progress     My goal is to get back to [facility] and increase physical activity. (pt-stated)   On track     Depression Screen    09/04/2023    8:29 AM 07/15/2023    8:23 AM 09/04/2022    8:19 AM 09/02/2022     8:56 AM 08/31/2021    9:01 AM 08/30/2021   10:13 AM 08/29/2020    8:04 AM  PHQ 2/9 Scores  PHQ - 2 Score 1 0 0 0 0 0 0  PHQ- 9 Score 1 0 0  0  0    Fall Risk    09/04/2023    8:24 AM 07/15/2023    8:19 AM 09/04/2022    8:19 AM 09/02/2022    8:52 AM 08/29/2022    9:17 AM  Fall Risk   Falls in the past year? 0 0 0 0 0  Number falls in past yr: 0 1 0 0 0  Injury with Fall? 0 0 0 0 0  Risk for fall due to : No Fall Risks   No Fall Risks   Follow up Falls prevention discussed;Falls evaluation completed Falls evaluation completed Falls evaluation completed Falls prevention discussed     MEDICARE RISK AT HOME: Medicare Risk at Home Any stairs in or around the home?: No Home free of loose throw rugs in walkways, pet beds, electrical cords, etc?: Yes Adequate lighting in your home to reduce risk of falls?: Yes Life alert?: No Use of a cane, walker or w/c?: No Grab bars in the bathroom?: Yes Shower chair or bench in shower?: Yes Elevated toilet seat or a handicapped toilet?: Yes  TIMED UP AND GO:  Was the test performed?  No    Cognitive Function:        09/04/2023    8:25 AM 09/02/2022    9:07 AM  6CIT Screen  What Year? 0 points 0 points  What month? 0 points 0 points  What time? 0 points 0 points  Count back from 20 0 points 0 points  Months in reverse 0 points 0 points  Repeat phrase 0 points 0 points  Total Score 0 points 0 points    Immunizations Immunization History  Administered Date(s) Administered   Fluad Quad(high Dose 65+) 07/06/2019, 08/29/2020, 08/13/2021, 07/02/2022   Fluad Trivalent(High Dose 65+) 07/15/2023   Influenza, High Dose Seasonal PF 07/01/2017, 07/02/2018, 08/13/2021   Moderna Covid-19 Fall Seasonal Vaccine 63yrs & older 07/24/2023   PFIZER(Purple Top)SARS-COV-2 Vaccination 11/28/2019, 12/21/2019, 09/21/2020   PNEUMOCOCCAL CONJUGATE-20 08/31/2021   Td 06/27/2008   Unspecified SARS-COV-2 Vaccination 11/15/2019    TDAP status: Due,  Education has been provided regarding the importance of  this vaccine. Advised may receive this vaccine at local pharmacy or Health Dept. Aware to provide a copy of the vaccination record if obtained from local pharmacy or Health Dept. Verbalized acceptance and understanding.  Flu Vaccine status: Up to date  Pneumococcal vaccine status: Up to date  Covid-19 vaccine status: Completed vaccines  Qualifies for Shingles Vaccine? Yes   Zostavax completed No   Shingrix Completed?: No.    Education has been provided regarding the importance of this vaccine. Patient has been advised to call insurance company to determine out of pocket expense if they have not yet received this vaccine. Advised may also receive vaccine at local pharmacy or Health Dept. Verbalized acceptance and understanding.  Screening Tests Health Maintenance  Topic Date Due   Hepatitis C Screening  09/08/2023 (Originally 08/10/1964)   DTaP/Tdap/Td (2 - Tdap) 09/03/2024 (Originally 06/27/2018)   COVID-19 Vaccine (6 - 2023-24 season) 09/18/2023   Medicare Annual Wellness (AWV)  09/03/2024   Colonoscopy  05/15/2025   Pneumonia Vaccine 23+ Years old  Completed   INFLUENZA VACCINE  Completed   DEXA SCAN  Completed   HPV VACCINES  Aged Out   Zoster Vaccines- Shingrix  Discontinued    Health Maintenance  There are no preventive care reminders to display for this patient.   Colorectal cancer screening: Type of screening: Colonoscopy. Completed 2021. Repeat every 10 years  Mammogram status: Completed 09/20/2022. Repeat every year  Bone Density status: Completed 04/03/2009. Results reflect: Bone density results: NORMAL. Repeat every 2 years.  Lung Cancer Screening: (Low Dose CT Chest recommended if Age 70-80 years, 20 pack-year currently smoking OR have quit w/in 15years.) does not qualify.   Lung Cancer Screening Referral: N/A  Additional Screening:  Hepatitis C Screening: does qualify;   Vision Screening: Recommended  annual ophthalmology exams for early detection of glaucoma and other disorders of the eye. Is the patient up to date with their annual eye exam?  Yes  Who is the provider or what is the name of the office in which the patient attends annual eye exams? Dr. Massie Kluver If pt is not established with a provider, would they like to be referred to a provider to establish care? No .   Dental Screening: Recommended annual dental exams for proper oral hygiene   Community Resource Referral / Chronic Care Management: CRR required this visit?  No   CCM required this visit?  No     Plan:     I have personally reviewed and noted the following in the patient's chart:   Medical and social history Use of alcohol, tobacco or illicit drugs  Current medications and supplements including opioid prescriptions. Patient is not currently taking opioid prescriptions. Functional ability and status Nutritional status Physical activity Advanced directives List of other physicians Hospitalizations, surgeries, and ER visits in previous 12 months Vitals Screenings to include cognitive, depression, and falls Referrals and appointments  In addition, I have reviewed and discussed with patient certain preventive protocols, quality metrics, and best practice recommendations. A written personalized care plan for preventive services as well as general preventive health recommendations were provided to patient.     Braydon Kullman L Shanta Hartner, CMA   09/04/2023   After Visit Summary: (MyChart) Due to this being a telephonic visit, the after visit summary with patients personalized plan was offered to patient via MyChart   Nurse Notes: Patient is due for a hep C screening.  She declines a DEXA at this time.  Patient is up to date with  all other health maintenance.  She has an appointment for a mammogram on 09/22/2023.  Patient had no concerns to address today.  She will wait to schedule next years AWV.

## 2023-09-08 ENCOUNTER — Ambulatory Visit (INDEPENDENT_AMBULATORY_CARE_PROVIDER_SITE_OTHER): Payer: Medicare Other | Admitting: Internal Medicine

## 2023-09-08 ENCOUNTER — Encounter: Payer: Self-pay | Admitting: Internal Medicine

## 2023-09-08 VITALS — BP 122/84 | HR 81 | Temp 98.4°F | Ht 62.0 in | Wt 139.0 lb

## 2023-09-08 DIAGNOSIS — E782 Mixed hyperlipidemia: Secondary | ICD-10-CM | POA: Diagnosis not present

## 2023-09-08 DIAGNOSIS — K582 Mixed irritable bowel syndrome: Secondary | ICD-10-CM

## 2023-09-08 DIAGNOSIS — I10 Essential (primary) hypertension: Secondary | ICD-10-CM

## 2023-09-08 DIAGNOSIS — F3341 Major depressive disorder, recurrent, in partial remission: Secondary | ICD-10-CM

## 2023-09-08 DIAGNOSIS — E039 Hypothyroidism, unspecified: Secondary | ICD-10-CM | POA: Diagnosis not present

## 2023-09-08 DIAGNOSIS — Z0000 Encounter for general adult medical examination without abnormal findings: Secondary | ICD-10-CM

## 2023-09-08 LAB — CBC
HCT: 31.8 % — ABNORMAL LOW (ref 36.0–46.0)
Hemoglobin: 10.3 g/dL — ABNORMAL LOW (ref 12.0–15.0)
MCHC: 32.4 g/dL (ref 30.0–36.0)
MCV: 74.7 fL — ABNORMAL LOW (ref 78.0–100.0)
Platelets: 520 10*3/uL — ABNORMAL HIGH (ref 150.0–400.0)
RBC: 4.26 Mil/uL (ref 3.87–5.11)
RDW: 16.6 % — ABNORMAL HIGH (ref 11.5–15.5)
WBC: 9.7 10*3/uL (ref 4.0–10.5)

## 2023-09-08 LAB — VITAMIN B12: Vitamin B-12: 275 pg/mL (ref 211–911)

## 2023-09-08 LAB — LIPID PANEL
Cholesterol: 103 mg/dL (ref 0–200)
HDL: 34.2 mg/dL — ABNORMAL LOW (ref >39.00)
LDL Cholesterol: 54 mg/dL (ref 0–99)
NonHDL: 69.12
Total CHOL/HDL Ratio: 3
Triglycerides: 75 mg/dL (ref 0.0–149.0)
VLDL: 15 mg/dL (ref 0.0–40.0)

## 2023-09-08 LAB — COMPREHENSIVE METABOLIC PANEL
ALT: 7 U/L (ref 0–35)
AST: 18 U/L (ref 0–37)
Albumin: 3.7 g/dL (ref 3.5–5.2)
Alkaline Phosphatase: 73 U/L (ref 39–117)
BUN: 8 mg/dL (ref 6–23)
CO2: 25 meq/L (ref 19–32)
Calcium: 9.7 mg/dL (ref 8.4–10.5)
Chloride: 106 meq/L (ref 96–112)
Creatinine, Ser: 0.99 mg/dL (ref 0.40–1.20)
GFR: 55.95 mL/min — ABNORMAL LOW (ref >60.00)
Glucose, Bld: 96 mg/dL (ref 70–99)
Potassium: 3.9 meq/L (ref 3.5–5.1)
Sodium: 141 meq/L (ref 135–145)
Total Bilirubin: 0.4 mg/dL (ref 0.2–1.2)
Total Protein: 7 g/dL (ref 6.0–8.3)

## 2023-09-08 LAB — T4, FREE: Free T4: 1.4 ng/dL (ref 0.60–1.60)

## 2023-09-08 LAB — VITAMIN D 25 HYDROXY (VIT D DEFICIENCY, FRACTURES): VITD: 35.1 ng/mL (ref 30.00–100.00)

## 2023-09-08 LAB — TSH: TSH: 0.02 u[IU]/mL — ABNORMAL LOW (ref 0.35–5.50)

## 2023-09-08 MED ORDER — HYDROCHLOROTHIAZIDE 25 MG PO TABS: 12.5000 mg | ORAL_TABLET | Freq: Every day | ORAL | 3 refills | Status: DC

## 2023-09-08 NOTE — Assessment & Plan Note (Signed)
Flu shot complete. Pneumonia complete. Shingrix declines. Tetanus up to date. Colonoscopy up to date. Mammogram up to date, pap smear aged out and dexa up to date. Counseled about sun safety and mole surveillance. Counseled about the dangers of distracted driving. Given 10 year screening recommendations.

## 2023-09-08 NOTE — Assessment & Plan Note (Signed)
BP at goal on 1/2 hydrochlorothiazide (12.5 mg daily dosing). Checking CMP and adjust as needed.

## 2023-09-08 NOTE — Assessment & Plan Note (Signed)
Referral to GI and needs different office she was unaware she was discharged from [facility].

## 2023-09-08 NOTE — Assessment & Plan Note (Signed)
Checking lipid panel and adjust lovastatin 40 mg daily as needed.

## 2023-09-08 NOTE — Assessment & Plan Note (Signed)
Checking TSH and free T4 and adjust as needed levothyroxine 88 mcg daily.

## 2023-09-08 NOTE — Progress Notes (Signed)
   Subjective:   Patient ID: Brandi Ferguson, female    DOB: 02/01/1946, 77 y.o.   MRN: [id number]  HPI The patient is here for physical.  PMH, [facility], social history reviewed and updated  Review of Systems  Constitutional: Negative.   HENT: Negative.    Eyes: Negative.   Respiratory:  Negative for cough, chest tightness and shortness of breath.   Cardiovascular:  Negative for chest pain, palpitations and leg swelling.  Gastrointestinal:  Positive for constipation and diarrhea. Negative for abdominal distention, abdominal pain, nausea and vomiting.  Musculoskeletal: Negative.   Skin: Negative.   Neurological: Negative.   Psychiatric/Behavioral: Negative.      Objective:  Physical Exam Constitutional:      Appearance: She is well-developed.  HENT:     Head: Normocephalic and atraumatic.  Cardiovascular:     Rate and Rhythm: Normal rate and regular rhythm.  Pulmonary:     Effort: Pulmonary effort is normal. No respiratory distress.     Breath sounds: Normal breath sounds. No wheezing or rales.  Abdominal:     General: Bowel sounds are normal. There is no distension.     Palpations: Abdomen is soft.     Tenderness: There is no abdominal tenderness. There is no rebound.  Musculoskeletal:     Cervical back: Normal range of motion.  Skin:    General: Skin is warm and dry.  Neurological:     Mental Status: She is alert and oriented to person, place, and time.     Coordination: Coordination normal.     Vitals:   09/08/23 0941  BP: 122/84  Pulse: 81  Temp: 98.4 F (36.9 C)  TempSrc: Oral  SpO2: 98%  Weight: 139 lb (63 kg)  Height: 5\' 2"  (1.575 m)    Assessment & Plan:

## 2023-09-08 NOTE — Assessment & Plan Note (Signed)
Stable and taking alprazolam 0.5 mg TID. Previously on wellbutrin but did not work well. Continue.

## 2023-09-09 ENCOUNTER — Encounter: Payer: Self-pay | Admitting: Internal Medicine

## 2023-09-09 ENCOUNTER — Telehealth: Payer: Self-pay | Admitting: Internal Medicine

## 2023-09-09 NOTE — Telephone Encounter (Signed)
Patient called and would like someone to call her concerning the results of her recent blood work.  She has some questions concerning some of the numbers.  Please call [phone]

## 2023-09-10 ENCOUNTER — Other Ambulatory Visit: Payer: Self-pay | Admitting: Internal Medicine

## 2023-09-10 ENCOUNTER — Telehealth: Payer: Self-pay | Admitting: Pharmacy Technician

## 2023-09-10 DIAGNOSIS — D509 Iron deficiency anemia, unspecified: Secondary | ICD-10-CM | POA: Insufficient documentation

## 2023-09-10 DIAGNOSIS — D508 Other iron deficiency anemias: Secondary | ICD-10-CM

## 2023-09-10 NOTE — Telephone Encounter (Signed)
Fyi note:  Patient will be scheduled as soon as possible.  Auth Submission: NO AUTH NEEDED Site of care: Site of care: [facility] Payer: BCBS [place] Medication & CPT/J Code(s) submitted: Feraheme (ferumoxytol) [id number] Route of submission (phone, fax, portal):  Phone # Fax # Auth type: Buy/Bill PB Units/visits requested: 2 Reference number:  Approval from: 11/27/24to 10/14/23

## 2023-09-10 NOTE — Telephone Encounter (Signed)
Patient has sent the provider a message

## 2023-09-16 ENCOUNTER — Other Ambulatory Visit: Payer: Self-pay | Admitting: Internal Medicine

## 2023-09-22 ENCOUNTER — Ambulatory Visit
Admission: RE | Admit: 2023-09-22 | Discharge: 2023-09-22 | Disposition: A | Discharge status: Home / Self Care | Payer: Medicare Other | Source: Ambulatory Visit | Attending: Internal Medicine | Admitting: Internal Medicine

## 2023-09-22 DIAGNOSIS — Z1231 Encounter for screening mammogram for malignant neoplasm of breast: Secondary | ICD-10-CM

## 2023-09-25 ENCOUNTER — Ambulatory Visit: Payer: Medicare Other

## 2023-09-25 VITALS — BP 136/74 | HR 72 | Temp 97.6°F | Resp 16 | Ht 62.0 in | Wt 138.8 lb

## 2023-09-25 DIAGNOSIS — D509 Iron deficiency anemia, unspecified: Secondary | ICD-10-CM

## 2023-09-25 DIAGNOSIS — D508 Other iron deficiency anemias: Secondary | ICD-10-CM

## 2023-09-25 MED ORDER — ACETAMINOPHEN 325 MG PO TABS
650.0000 mg | ORAL_TABLET | Freq: Once | ORAL | Status: AC
Start: 2023-09-25 — End: 2023-09-25
  Administered 2023-09-25: 650 mg via ORAL
  Filled 2023-09-25: qty 2

## 2023-09-25 MED ORDER — DIPHENHYDRAMINE HCL 25 MG PO CAPS
25.0000 mg | ORAL_CAPSULE | Freq: Once | ORAL | Status: AC
  Administered 2023-09-25: 25 mg via ORAL
  Filled 2023-09-25: qty 1

## 2023-09-25 MED ORDER — SODIUM CHLORIDE 0.9 % IV SOLN
510.0000 mg | Freq: Once | INTRAVENOUS | Status: AC
  Administered 2023-09-25: 510 mg via INTRAVENOUS
  Filled 2023-09-25: qty 17

## 2023-09-25 NOTE — Progress Notes (Signed)
Diagnosis: Iron Deficiency Anemia  Provider:  Chilton Greathouse MD  Procedure: IV Infusion  IV Type: Peripheral, IV Location: L Forearm  Feraheme (Ferumoxytol), Dose: 510 mg  Infusion Start Time: 1510  Infusion Stop Time: 1527  Post Infusion IV Care: Observation period completed and Peripheral IV Discontinued  Discharge: Condition: Good, Destination: Home . AVS Provided  Performed by:  Rico Ala, LPN

## 2023-09-29 ENCOUNTER — Other Ambulatory Visit: Payer: Self-pay | Admitting: Internal Medicine

## 2023-10-01 ENCOUNTER — Other Ambulatory Visit: Payer: Self-pay

## 2023-10-01 NOTE — Telephone Encounter (Signed)
Copied from CRM [id number]. Topic: Clinical - Medication Refill >> Oct 01, 2023  4:00 PM Fonda Kinder J wrote: Most Recent Primary Care Visit:  Provider: Hillard Danker A  Department: [facility] [place]  Visit Type: PHYSICAL  Date: 09/08/2023  Medication: 1.Hyoscyamine 2. ALPRAZolam  Has the patient contacted their pharmacy? Yes (Agent: If no, request that the patient contact the pharmacy for the refill. If patient does not wish to contact the pharmacy document the reason why and proceed with request.) (Agent: If yes, when and what did the pharmacy advise?) Contact PCP  Is this the correct pharmacy for this prescription? Yes If no, delete pharmacy and type the correct one.  This is the patient's preferred pharmacy:  [id number] [place] [place] - [address] [address] Phone: [phone] Fax: [phone]   Has the prescription been filled recently? No  Is the patient out of the medication? Yes  Has the patient been seen for an appointment in the last year OR does the patient have an upcoming appointment? Yes  Can we respond through MyChart? Yes  Agent: Please be advised that Rx refills may take up to 3 business days. We ask that you follow-up with your pharmacy.

## 2023-10-02 ENCOUNTER — Ambulatory Visit: Payer: Medicare Other | Admitting: *Deleted

## 2023-10-02 ENCOUNTER — Other Ambulatory Visit: Payer: Self-pay

## 2023-10-02 ENCOUNTER — Telehealth: Payer: Self-pay | Admitting: Internal Medicine

## 2023-10-02 VITALS — BP 124/76 | HR 54 | Temp 97.4°F | Resp 16 | Ht 62.0 in | Wt 138.8 lb

## 2023-10-02 DIAGNOSIS — D509 Iron deficiency anemia, unspecified: Secondary | ICD-10-CM | POA: Diagnosis not present

## 2023-10-02 DIAGNOSIS — D508 Other iron deficiency anemias: Secondary | ICD-10-CM

## 2023-10-02 MED ORDER — ACETAMINOPHEN 325 MG PO TABS
650.0000 mg | ORAL_TABLET | Freq: Once | ORAL | Status: AC
  Administered 2023-10-02: 650 mg via ORAL
  Filled 2023-10-02: qty 2

## 2023-10-02 MED ORDER — SODIUM CHLORIDE 0.9 % IV SOLN
510.0000 mg | Freq: Once | INTRAVENOUS | Status: AC
  Administered 2023-10-02: 510 mg via INTRAVENOUS
  Filled 2023-10-02: qty 17

## 2023-10-02 MED ORDER — DIPHENHYDRAMINE HCL 25 MG PO CAPS
25.0000 mg | ORAL_CAPSULE | Freq: Once | ORAL | Status: AC
Start: 2023-10-02 — End: 2023-10-02
  Administered 2023-10-02: 25 mg via ORAL
  Filled 2023-10-02: qty 1

## 2023-10-02 MED ORDER — HYOSCYAMINE SULFATE 0.125 MG PO TBDP: 0.1250 mg | ORAL_TABLET | Freq: Four times a day (QID) | ORAL | 0 refills | Status: DC | PRN

## 2023-10-02 NOTE — Addendum Note (Signed)
Addended by: Primus Bravo E on: 10/02/2023 10:51 AM   Modules accepted: Orders

## 2023-10-02 NOTE — Telephone Encounter (Signed)
Please refill xanax

## 2023-10-02 NOTE — Progress Notes (Signed)
Diagnosis: Iron Deficiency Anemia  Provider:  Chilton Greathouse MD  Procedure: IV Infusion  IV Type: Peripheral, IV Location: L Forearm  Feraheme (Ferumoxytol), Dose: 510 mg  Infusion Start Time: 0923 am  Infusion Stop Time: 0941 am  Post Infusion IV Care: Observation period completed and Peripheral IV Discontinued  Discharge: Condition: Good, Destination: Home . AVS Provided  Performed by:  Forrest Moron, RN

## 2023-10-02 NOTE — Telephone Encounter (Signed)
Prescription Request  10/02/2023  LOV: 09/08/2023  What is the name of the medication or equipment?  ALPRAZolam (XANAX) 0.5 MG tablet  hyoscyamine (ANASPAZ) 0.125 MG TBDP disintergrating tablet   Have you contacted your pharmacy to request a refill? Yes   Which pharmacy would you like this sent to?   [facility] [place], [place] - [address] [address] Phone: [phone] Fax: [phone]    Patient notified that their request is being sent to the clinical staff for review and that they should receive a response within 2 business days.   Please advise at Mobile [phone] (mobile)

## 2023-10-03 MED ORDER — ALPRAZOLAM 0.5 MG PO TABS: 0.5000 mg | ORAL_TABLET | Freq: Three times a day (TID) | ORAL | 5 refills | Status: DC | PRN

## 2023-10-03 NOTE — Telephone Encounter (Signed)
Sent in

## 2023-10-06 ENCOUNTER — Other Ambulatory Visit: Payer: Self-pay

## 2023-10-06 NOTE — Telephone Encounter (Signed)
Please refill for patient.

## 2023-10-17 ENCOUNTER — Other Ambulatory Visit: Payer: Self-pay | Admitting: Internal Medicine

## 2023-10-23 ENCOUNTER — Encounter: Payer: Self-pay | Admitting: Internal Medicine

## 2023-10-23 DIAGNOSIS — D508 Other iron deficiency anemias: Secondary | ICD-10-CM

## 2023-10-27 ENCOUNTER — Other Ambulatory Visit: Payer: Self-pay | Admitting: Internal Medicine

## 2023-10-30 DIAGNOSIS — H5202 Hypermetropia, left eye: Secondary | ICD-10-CM | POA: Diagnosis not present

## 2023-11-19 ENCOUNTER — Other Ambulatory Visit (INDEPENDENT_AMBULATORY_CARE_PROVIDER_SITE_OTHER): Payer: Medicare Other

## 2023-11-19 DIAGNOSIS — D508 Other iron deficiency anemias: Secondary | ICD-10-CM | POA: Diagnosis not present

## 2023-11-19 LAB — CBC
HCT: 40.3 % (ref 36.0–46.0)
Hemoglobin: 13.7 g/dL (ref 12.0–15.0)
MCHC: 34 g/dL (ref 30.0–36.0)
MCV: 86.6 fL (ref 78.0–100.0)
Platelets: 440 10*3/uL — ABNORMAL HIGH (ref 150.0–400.0)
RBC: 4.65 Mil/uL (ref 3.87–5.11)
RDW: 21.6 % — ABNORMAL HIGH (ref 11.5–15.5)
WBC: 8.7 10*3/uL (ref 4.0–10.5)

## 2023-11-19 LAB — FERRITIN: Ferritin: 87.4 ng/mL (ref 10.0–291.0)

## 2023-11-21 ENCOUNTER — Encounter: Payer: Self-pay | Admitting: Internal Medicine

## 2024-01-23 ENCOUNTER — Other Ambulatory Visit: Payer: Self-pay | Admitting: Internal Medicine

## 2024-02-12 DIAGNOSIS — Z860100 Personal history of colon polyps, unspecified: Secondary | ICD-10-CM | POA: Diagnosis not present

## 2024-02-12 DIAGNOSIS — K5900 Constipation, unspecified: Secondary | ICD-10-CM | POA: Diagnosis not present

## 2024-03-03 ENCOUNTER — Other Ambulatory Visit: Payer: Self-pay | Admitting: Internal Medicine

## 2024-03-17 ENCOUNTER — Other Ambulatory Visit: Payer: Self-pay | Admitting: Internal Medicine

## 2024-04-01 ENCOUNTER — Other Ambulatory Visit: Payer: Self-pay | Admitting: Internal Medicine

## 2024-04-05 ENCOUNTER — Telehealth: Payer: Self-pay | Admitting: Internal Medicine

## 2024-04-05 NOTE — Telephone Encounter (Signed)
 Copied from CRM [id number]. Topic: Clinical - Medication Refill >> Apr 05, 2024 10:48 AM Berwyn MATSU wrote: Medication: ALPRAZolam  (XANAX ) 0.5 MG tablet  Has the patient contacted their pharmacy? Yes (Agent: If no, request that the patient contact the pharmacy for the refill. If patient does not wish to contact the pharmacy document the reason why and proceed with request.) (Agent: If yes, when and what did the pharmacy advise?)  This is the patient's preferred pharmacy:  [facility] [place], [place] - [address] [place] [address] Phone: [phone] Fax: [phone]  Is this the correct pharmacy for this prescription? Yes If no, delete pharmacy and type the correct one.   Has the prescription been filled recently? Yes  Is the patient out of the medication? Yes well about to be 1 pill left  Has the patient been seen for an appointment in the last year OR does the patient have an upcoming appointment? Yes  Can we respond through MyChart? Yes  Agent: Please be advised that Rx refills may take up to 3 business days. We ask that you follow-up with your pharmacy.

## 2024-04-06 ENCOUNTER — Encounter: Payer: Self-pay | Admitting: Internal Medicine

## 2024-04-06 NOTE — Telephone Encounter (Signed)
 Copied from CRM [id number]. Topic: Clinical - Prescription Issue >> Apr 06, 2024  9:59 AM Thersia BROCKS wrote: Reason for CRM: Patient called in regarding ALPRAZolam  (XANAX ) 0.5 MG tablet , wanting to know what the status is of the medication, as patient stated she has requested a refill since last Thursday, would like for a nurse to give her a callback regarding this

## 2024-04-07 NOTE — Telephone Encounter (Signed)
 Pt is requesting a a status update about her refill. Let them know PCP was out of office last week which may be a part of the delay.  Please review and send refill ASAP, or please call to update the pt.

## 2024-04-08 ENCOUNTER — Other Ambulatory Visit: Payer: Self-pay | Admitting: Family Medicine

## 2024-04-08 MED ORDER — ALPRAZOLAM 0.5 MG PO TABS
0.5000 mg | ORAL_TABLET | Freq: Three times a day (TID) | ORAL | 0 refills | Status: DC | PRN
Start: 2024-04-08 — End: 2024-04-21

## 2024-04-08 NOTE — Telephone Encounter (Signed)
 Pt has called back requesting a status update. Pt is very upset that this hasn't been refilled and that no one has called her with an update.   Please refill RX ASAP if appropriate.  Forwarding to DOD as PCP is out of office.

## 2024-04-08 NOTE — Telephone Encounter (Signed)
 Copied from CRM [id number]. Topic: Clinical - Medication Question >> Apr 08, 2024 11:29 AM Shereese L wrote: Reason for CRM: patient and husband have been calling in reference to medication refill for ALPRAZolam  (XANAX ) 0.5 MG tablet. As per Rexene at the office she spoke with the husband and adv that the doc was a little behind due to not being in the office and she's working on it. Message was sent to provider and office manager. The husband was advised that Shanon Tudall will be giving them a call to correct the issue

## 2024-04-21 ENCOUNTER — Ambulatory Visit: Admitting: Internal Medicine

## 2024-04-21 ENCOUNTER — Encounter: Payer: Self-pay | Admitting: Internal Medicine

## 2024-04-21 VITALS — BP 120/80 | HR 86 | Temp 98.4°F | Ht 62.0 in | Wt 139.0 lb

## 2024-04-21 DIAGNOSIS — K582 Mixed irritable bowel syndrome: Secondary | ICD-10-CM

## 2024-04-21 DIAGNOSIS — F3341 Major depressive disorder, recurrent, in partial remission: Secondary | ICD-10-CM

## 2024-04-21 MED ORDER — ALPRAZOLAM 0.5 MG PO TABS: 0.5000 mg | ORAL_TABLET | Freq: Three times a day (TID) | ORAL | 5 refills | Status: DC | PRN

## 2024-04-21 MED ORDER — MAGNESIUM CITRATE PO SOLN: 1.0000 | Freq: Once | ORAL | 3 refills | Status: AC

## 2024-04-21 MED ORDER — POLYETHYLENE GLYCOL 3350 17 GM/SCOOP PO POWD: 17.0000 g | Freq: Two times a day (BID) | ORAL | 1 refills | Status: DC | PRN

## 2024-04-21 NOTE — Patient Instructions (Addendum)
 We have sent in miralax  (polyethylene glycol) to take up to 2 scoops a day.

## 2024-04-21 NOTE — Assessment & Plan Note (Signed)
 Alprazolam  refill redone as pharmacy did not get.

## 2024-04-21 NOTE — Assessment & Plan Note (Signed)
 With flare and rx for miralax  (she uses otc and wants rx) as well as magnesium  citrate to take for this flare of constipation.

## 2024-04-21 NOTE — Progress Notes (Signed)
   Subjective:   Patient ID: Brandi Ferguson, female    DOB: [birth year], 78 y.o.   MRN: [id number]  HPI The patient is a 78 YO female coming in for concerns about IBS. She does have IBS with some constipation chronically and last BM 5-6 days ago.   Review of Systems  Constitutional: Negative.   HENT: Negative.    Eyes: Negative.   Respiratory:  Negative for cough, chest tightness and shortness of breath.   Cardiovascular:  Negative for chest pain, palpitations and leg swelling.  Gastrointestinal:  Positive for constipation. Negative for abdominal distention, abdominal pain, diarrhea, nausea and vomiting.  Musculoskeletal: Negative.   Skin: Negative.   Neurological: Negative.   Psychiatric/Behavioral: Negative.      Objective:  Physical Exam Constitutional:      Appearance: She is well-developed.  HENT:     Head: Normocephalic and atraumatic.  Cardiovascular:     Rate and Rhythm: Normal rate and regular rhythm.  Pulmonary:     Effort: Pulmonary effort is normal. No respiratory distress.     Breath sounds: Normal breath sounds. No wheezing or rales.  Abdominal:     General: Bowel sounds are normal. There is no distension.     Palpations: Abdomen is soft.     Tenderness: There is no abdominal tenderness. There is no rebound.  Musculoskeletal:     Cervical back: Normal range of motion.  Skin:    General: Skin is warm and dry.  Neurological:     Mental Status: She is alert and oriented to person, place, and time.     Coordination: Coordination normal.     Vitals:   04/21/24 0919  BP: 120/80  Pulse: 86  Temp: 98.4 F (36.9 C)  TempSrc: Oral  SpO2: 98%  Weight: 139 lb (63 kg)  Height: 5' 2 (1.575 m)    Assessment & Plan:

## 2024-05-26 DIAGNOSIS — L814 Other melanin hyperpigmentation: Secondary | ICD-10-CM | POA: Diagnosis not present

## 2024-05-26 DIAGNOSIS — D225 Melanocytic nevi of trunk: Secondary | ICD-10-CM | POA: Diagnosis not present

## 2024-05-26 DIAGNOSIS — L821 Other seborrheic keratosis: Secondary | ICD-10-CM | POA: Diagnosis not present

## 2024-06-01 ENCOUNTER — Other Ambulatory Visit: Payer: Self-pay | Admitting: Internal Medicine

## 2024-06-02 ENCOUNTER — Other Ambulatory Visit: Payer: Self-pay | Admitting: Internal Medicine

## 2024-06-02 NOTE — Telephone Encounter (Unsigned)
 Copied from CRM [id number]. Topic: Clinical - Medication Refill >> Jun 02, 2024  9:23 AM Berneda FALCON wrote: Medication:  levothyroxine  (SYNTHROID ) 88 MCG tablet-90 day refill   Has the patient contacted their pharmacy? Yes, they tried to call [facility] (Agent: If no, request that the patient contact the pharmacy for the refill. If patient does not wish to contact the pharmacy document the reason why and proceed with request.) (Agent: If yes, when and what did the pharmacy advise?)  This is the patient's preferred pharmacy:  [id number] [address], [place] - [address] [place] [address] Phone: [phone] Fax: [phone]  Is this the correct pharmacy for this prescription? Yes If no, delete pharmacy and type the correct one.   Has the prescription been filled recently? No  Is the patient out of the medication? No, but only has 2 left  Has the patient been seen for an appointment in the last year OR does the patient have an upcoming appointment? Yes  Can we respond through MyChart? Yes  Agent: Please be advised that Rx refills may take up to 3 business days. We ask that you follow-up with your pharmacy.

## 2024-06-03 MED ORDER — LEVOTHYROXINE SODIUM 88 MCG PO TABS: 88.0000 ug | ORAL_TABLET | Freq: Every day | ORAL | 2 refills | Status: DC

## 2024-06-17 DIAGNOSIS — Z01411 Encounter for gynecological examination (general) (routine) with abnormal findings: Secondary | ICD-10-CM | POA: Diagnosis not present

## 2024-06-17 DIAGNOSIS — N891 Moderate vaginal dysplasia: Secondary | ICD-10-CM | POA: Diagnosis not present

## 2024-06-17 DIAGNOSIS — R300 Dysuria: Secondary | ICD-10-CM | POA: Diagnosis not present

## 2024-06-29 ENCOUNTER — Encounter: Payer: Self-pay | Admitting: Internal Medicine

## 2024-07-02 NOTE — Addendum Note (Signed)
 Addended by: ROSALVA LEX RAMAN on: 07/02/2024 04:20 PM   Modules accepted: Orders

## 2024-07-08 DIAGNOSIS — K649 Unspecified hemorrhoids: Secondary | ICD-10-CM | POA: Diagnosis not present

## 2024-07-08 DIAGNOSIS — Z860100 Personal history of colon polyps, unspecified: Secondary | ICD-10-CM | POA: Diagnosis not present

## 2024-07-08 DIAGNOSIS — K5900 Constipation, unspecified: Secondary | ICD-10-CM | POA: Diagnosis not present

## 2024-07-26 DIAGNOSIS — K5900 Constipation, unspecified: Secondary | ICD-10-CM | POA: Diagnosis not present

## 2024-08-03 ENCOUNTER — Ambulatory Visit (INDEPENDENT_AMBULATORY_CARE_PROVIDER_SITE_OTHER): Admitting: Licensed Clinical Social Worker

## 2024-08-03 DIAGNOSIS — F4323 Adjustment disorder with mixed anxiety and depressed mood: Secondary | ICD-10-CM | POA: Diagnosis not present
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
## 2024-08-17 ENCOUNTER — Encounter: Payer: Self-pay | Admitting: Licensed Clinical Social Worker

## 2024-08-17 ENCOUNTER — Ambulatory Visit (INDEPENDENT_AMBULATORY_CARE_PROVIDER_SITE_OTHER): Admitting: Licensed Clinical Social Worker

## 2024-08-17 DIAGNOSIS — F4323 Adjustment disorder with mixed anxiety and depressed mood: Secondary | ICD-10-CM | POA: Diagnosis not present

## 2024-08-17 NOTE — Progress Notes (Signed)
 [facility] Counselor/Therapist Progress Note  Patient ID: Brandi Ferguson, MRN: [id number]    Date: 08/17/24  Time Spent: 10:00  am - 10:53 am : 53 Minutes  Treatment Type: Individual Therapy.  Reported Symptoms: Patient reports low mood, anxiety, and difficulty coping with life transitions related to aging, retirement, and her husband's declining health. She feels worried, sad, and frustrated, with reduced motivation and social withdrawal. Chronic IBS symptoms contribute to fatigue and isolation. Despite these challenges, patient remains motivated to improve coping, reestablish self-care, and enhance emotional well-being.  Mental Status Exam: Appearance:  Well Groomed     Behavior: Appropriate and Sharing  Motor: Normal  Speech/Language:  Normal Rate  Affect: Appropriate  Mood: anxious and irritable  Thought process: normal  Thought content:   WNL  Sensory/Perceptual disturbances:   WNL  Orientation: oriented to person, place, and time/date  Attention: Good  Concentration: Good  Memory: WNL  Fund of knowledge:  Good  Insight:   Good  Judgment:  Good  Impulse Control: Good   Risk Assessment: Danger to Self:  No Self-injurious Behavior: No Danger to Others: No Duty to Warn:no Physical Aggression / Violence:No  Access to Firearms a concern: No  Gang Involvement:No   Subjective:   Brandi Ferguson participated in the session, in person in the office with the therapist, and consented to treatment Brandi Ferguson reviewed the events of the past week. Patient reports ongoing low mood and anxiety related to recent life changes and concerns about her husband's declining health, possibly due to congestive heart failure. Patient states she feels worried and increasingly responsible for her husband's wellbeing. She notes decreased motivation and reduced participation in activities outside the home. Patient expresses desire to improve her own coping and emotional balance  despite these stressors.    We reviewed numerous treatment approaches including CBT and Solution focused therapy. Psych-education regarding the [place]'s diagnosis of Adjustment disorder with mixed anxiety and depressed mood was provided during the session. We discussed Brandi Ferguson's goals treatment goals which include To improve emotional adjustment and coping in response to aging, retirement, and spousal health challenges by increasing engagement in meaningful activities, developing adaptive coping strategies, and enhancing self-care and social connection.. Brandi Ferguson provided verbal approval of the treatment plan.    Interventions: Psycho-education & Goal Setting.   Diagnosis:   Adjustment disorder with mixed anxiety and depressed mood  Psychiatric Treatment: No , N/A  Treatment Plan:  Client Abilities/Strengths [name] is open to sessions. Patient appeared appropriately dressed and groomed, with mood described as "worried" and affect congruent. Thought processes were logical and goal-directed. No evidence of psychosis or suicidal ideation. Patient demonstrated insight into the connection between her stress, physical symptoms, and emotional well-being. Engagement in session was active and cooperative.   Support System: Family and Friends   Client Treatment Preferences In person   Client Statement of Needs Brandi Ferguson would like to begin therapy and develop a plan to increase personal engagement and self care despite current limitations.     Treatment Level Biweekly  Symptoms  hopeful   (Status: improved) optimistic   (Status: improved)  Goals:   Brandi Ferguson agreed to setting goal of To improve emotional adjustment and coping in response to aging, retirement, and spousal health challenges by increasing engagement in meaningful activities, developing adaptive coping strategies, and enhancing self-care and social connection. Patient continues to meet criteria for Adjustment  Disorder with mixed anxiety and depressed mood. Current stressors include spousal illness and social  withdrawal, leading to increased emotional distress and isolation. Patient shows motivation for change and openness to developing coping strategies and structured self-care. Clinical focus remains on supporting adaptive coping, emotion regulation, and gradual re-engagement in meaningful activities.  Collaboratively developed initial 8-week treatment goals focused on emotional regulation, self-care, and coping with spousal health concerns.  Treatment plan signed and available on s-drive:  Yes    Target Date: 10/12/2024 Frequency: Biweekly  Progress: 0 Modality: individual    Therapist will provide referrals for additional resources as appropriate.  Therapist will provide psycho-education regarding Brandi Ferguson's diagnosis and corresponding treatment approaches and interventions. Licensed Clinical Mental Health Counselor, Tawni Louder, [facility], will support the patient's ability to achieve the goals identified. will employ CBT, BA, Problem-solving, Solution Focused, Mindfulness,  coping skills, & other evidenced-based practices will be used to promote progress towards healthy functioning to help manage decrease symptoms associated with her diagnosis.   Reduce overall level, frequency, and intensity of the feelings of depression, anxiety and panic evidenced by decreased anxious thinking from 6 to 7 days/week to 0 to 1 days/week per client report for at least 3 consecutive months. Verbally express understanding of the relationship between feelings of depression, anxiety and their impact on thinking patterns and behaviors. Verbalize an understanding of the role that distorted thinking plays in creating fears, excessive worry, and ruminations.  [name] participated in the creation of the treatment plan)    Tawni Louder, LCMHC

## 2024-08-23 ENCOUNTER — Other Ambulatory Visit: Payer: Self-pay | Admitting: Internal Medicine

## 2024-08-23 DIAGNOSIS — Z1231 Encounter for screening mammogram for malignant neoplasm of breast: Secondary | ICD-10-CM

## 2024-08-26 ENCOUNTER — Ambulatory Visit (INDEPENDENT_AMBULATORY_CARE_PROVIDER_SITE_OTHER)

## 2024-08-26 VITALS — Ht 62.0 in | Wt 133.0 lb

## 2024-08-26 DIAGNOSIS — Z0000 Encounter for general adult medical examination without abnormal findings: Secondary | ICD-10-CM

## 2024-08-26 NOTE — Patient Instructions (Addendum)
 Ms. Soza,  Thank you for taking the time for your Medicare Wellness Visit. I appreciate your continued commitment to your health goals. Please review the care plan we discussed, and feel free to reach out if I can assist you further.  Please note that Annual Wellness Visits do not include a physical exam. Some assessments may be limited, especially if the visit was conducted virtually. If needed, we may recommend an in-person follow-up with your provider.  Ongoing Care Seeing your primary care provider every 3 to 6 months helps us  monitor your health and provide consistent, personalized care.   Referrals If a referral was made during today's visit and you haven't received any updates within two weeks, please contact the referred provider directly to check on the status.  Recommended Screenings:  Health Maintenance  Topic Date Due   Hepatitis C Screening  Never done   COVID-19 Vaccine (5 - 2025-26 season) 06/14/2024   DTaP/Tdap/Td vaccine (2 - Tdap) 09/03/2024*   Medicare Annual Wellness Visit  08/26/2025   Pneumococcal Vaccine for age over 84  Completed   Flu Shot  Completed   DEXA scan (bone density measurement)  Completed   Meningitis B Vaccine  Aged Out   Breast Cancer Screening  Discontinued   Colon Cancer Screening  Discontinued   Zoster (Shingles) Vaccine  Discontinued  *Topic was postponed. The date shown is not the original due date.       08/26/2024    3:17 PM  Advanced Directives  Does Patient Have a Medical Advance Directive? No  Would patient like information on creating a medical advance directive? No - Patient declined    Vision: Annual vision screenings are recommended for early detection of glaucoma, cataracts, and diabetic retinopathy. These exams can also reveal signs of chronic conditions such as diabetes and high blood pressure.  Dental: Annual dental screenings help detect early signs of oral cancer, gum disease, and other conditions linked to overall  health, including heart disease and diabetes.

## 2024-08-26 NOTE — Progress Notes (Cosign Needed Addendum)
 Chief Complaint  Patient presents with   Medicare Wellness     Subjective:  Please attest and cosign this visit due to patients primary care provider not being in the office at the time the visit was completed.  (Pt of Dr Almarie Cleveland)   Brandi Ferguson is a 78 y.o. female who presents for a Medicare Annual Wellness Visit.  I connected with  Brandi Ferguson on 08/26/24 by a audio enabled telemedicine application and verified that I am speaking with the correct person using two identifiers.  Patient Location: Home  Provider Location: Office/Clinic  Persons Participating in Visit: Patient.  I discussed the limitations of evaluation and management by telemedicine. The patient expressed understanding and agreed to proceed.  Vital Signs: Because this visit was a virtual/telehealth visit, some criteria may be missing or patient reported. Any vitals not documented were not able to be obtained and vitals that have been documented are patient reported.  Allergies (verified) Patient has no known allergies.   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   Anxiety 1967   Have had anxiety started in high school   ANXIETY DEPRESSION 02/06/2008   Cataract Surgery 04/2022   Cough 09/27/2008   DIVERTICULOSIS OF COLON 02/06/2008   HYPERLIPIDEMIA 06/27/2008   HYPERTENSION 02/06/2008   HYPOTHYROIDISM 02/06/2008   Irritable bowel syndrome 02/06/2008   Lump or mass in breast 02/06/2008   Overweight(278.02) 06/27/2008   SINUSITIS- ACUTE-NOS 01/04/2010   URI 09/04/2009   Past Surgical History:  Procedure Laterality Date   ABDOMINAL HYSTERECTOMY  May 2011   Total hysterectomy   BREAST EXCISIONAL BIOPSY Right 1984   BREAST SURGERY  1984   Right lumpectomy- Benign   CERVICAL CONE BIOPSY  1977   EYE SURGERY  Biopsy lt eye 2020   Cataract surgery on both eyes 04/2022   LAPAROSCOPY  1972   Fertility work-up   SKIN LESION EXCISION  2006    Left breast   TONSILLECTOMY     Family History  Problem Relation Age of Onset   Alzheimer's disease Mother    Alcohol abuse Father    Diabetes Father    Hyperlipidemia Father    Hypertension Father    Coronary artery disease Father    Cancer Sister    Social History   Occupational History   Occupation: Retired/ Horticulturist, commercial  Tobacco Use   Smoking status: Never   Smokeless tobacco: Never  Vaping Use   Vaping status: Never Used  Substance and Sexual Activity   Alcohol use: No   Drug use: No   Sexual activity: Yes    Partners: Male   Tobacco Counseling Counseling given: Not Answered  SDOH Screenings   Food Insecurity: No Food Insecurity (08/26/2024)  Housing: Unknown (08/26/2024)  Transportation Needs: No Transportation Needs (08/26/2024)  Utilities: Not At Risk (08/26/2024)  Alcohol Screen: Low Risk  (09/04/2023)  Depression (PHQ2-9): Low Risk  (08/26/2024)  Financial Resource Strain: Low Risk  (04/20/2024)  Physical Activity: Insufficiently Active (08/26/2024)  Social Connections: Moderately Integrated (08/26/2024)  Stress: Stress Concern Present (08/26/2024)  Tobacco Use: Low Risk  (08/26/2024)  Health Literacy: Adequate Health Literacy (08/26/2024)   See flowsheets for full screening details  Depression Screen PHQ 2 & 9 Depression Scale- Over the past 2 weeks, how often have you been bothered by any of the following problems? Little interest or pleasure in doing things: 0 Feeling down, depressed, or hopeless (PHQ Adolescent also includes...irritable): 0  PHQ-2 Total Score: 0 Trouble falling or staying asleep, or sleeping too much: 0 Feeling tired or having little energy: 0 Poor appetite or overeating (PHQ Adolescent also includes...weight loss): 0 Feeling bad about yourself - or that you are a failure or have let yourself or your family down: 0 Trouble concentrating on things, such as reading the newspaper or watching television (PHQ  Adolescent also includes...like school work): 0 Moving or speaking so slowly that other people could have noticed. Or the opposite - being so fidgety or restless that you have been moving around a lot more than usual: 0 Thoughts that you would be better off dead, or of hurting yourself in some way: 0 PHQ-9 Total Score: 0 If you checked off any problems, how difficult have these problems made it for you to do your work, take care of things at home, or get along with other people?: Not difficult at all  Depression Treatment Depression Interventions/Treatment : EYV7-0 Score <4 Follow-up Not Indicated     Goals Addressed               This Visit's Progress     Patient Stated (pt-stated)        Patient stated she plans to continue managing her IBS/hernia with Dr Burnette - GI.        Visit info / Clinical Intake: Medicare Wellness Visit Type:: Subsequent Annual Wellness Visit Persons participating in visit:: patient Medicare Wellness Visit Mode:: Telephone If telephone:: video declined Because this visit was a virtual/telehealth visit:: pt reported vitals If Telephone or Video please confirm:: I connected with the patient using audio enabled telemedicine application and verified that I am speaking with the correct person using two identifiers; I discussed the limitations of evaluation and management by telemedicine; The patient expressed understanding and agreed to proceed Patient Location:: Home Provider Location:: Office Information given by:: patient Interpreter Needed?: No Pre-visit prep was completed: yes AWV questionnaire completed by patient prior to visit?: no Living arrangements:: lives with spouse/significant other Patient's Overall Health Status Rating: (!) fair Typical amount of pain: some (Hernia - seeing GI Provider) Does pain affect daily life?: (!) yes Are you currently prescribed opioids?: no  Dietary Habits and Nutritional Risks How many meals a day?: 3 Eats  fruit and vegetables daily?: yes Most meals are obtained by: preparing own meals In the last 2 weeks, have you had any of the following?: none Diabetic:: no  Functional Status Activities of Daily Living (to include ambulation/medication): Independent Ambulation: Independent with device- listed below Home Assistive Devices/Equipment: Eyeglasses (wears eyeglasses for reading only & hearing aids) Medication Administration: Independent Home Management: Independent Manage your own finances?: yes Primary transportation is: driving Concerns about vision?: no *vision screening is required for WTM* Concerns about hearing?: (!) yes Uses hearing aids?: (!) yes Hear whispered voice?: yes  Fall Screening Falls in the past year?: 0 Number of falls in past year: 0 Was there an injury with Fall?: 0 Fall Risk Category Calculator: 0 Patient Fall Risk Level: Low Fall Risk  Fall Risk Patient at Risk for Falls Due to: No Fall Risks Fall risk Follow up: Falls evaluation completed; Falls prevention discussed  Home and Transportation Safety: All rugs have non-skid backing?: yes All stairs or steps have railings?: N/A, no stairs Grab bars in the bathtub or shower?: yes Have non-skid surface in bathtub or shower?: yes Good home lighting?: yes Regular seat belt use?: yes Hospital stays in the last year:: no  Cognitive Assessment Difficulty concentrating,  remembering, or making decisions? : no Will 6CIT or Mini Cog be Completed: yes What year is it?: 0 points What month is it?: 0 points Give patient an address phrase to remember (5 components): [address] [place], [place] About what time is it?: 0 points Count backwards from 20 to 1: 0 points Say the months of the year in reverse: 0 points Repeat the address phrase from earlier: 0 points 6 CIT Score: 0 points  Advance Directives (For Healthcare) Does Patient Have a Medical Advance Directive?: No Type of Advance Directive: Healthcare Power of  [place]; Living will Copy of Healthcare Power of Attorney in Chart?: No - copy requested Copy of Living Will in Chart?: No - copy requested Would patient like information on creating a medical advance directive?: No - Patient declined  Reviewed/Updated  Reviewed/Updated: Reviewed All (Medical, Surgical, Family, Medications, Allergies, Care Teams, Patient Goals)        Objective:    Today's Vitals   08/26/24 1515  Weight: 133 lb (60.3 kg)  Height: 5' 2 (1.575 m)   Body mass index is 24.33 kg/m.  Current Medications (verified) Outpatient Encounter Medications as of 08/26/2024  Medication Sig   ALPRAZolam  (XANAX ) 0.5 MG tablet Take 1 tablet (0.5 mg total) by mouth 3 (three) times daily as needed.   hydrochlorothiazide  (HYDRODIURIL ) 25 MG tablet Take 0.5 tablets (12.5 mg total) by mouth daily.   levothyroxine  (SYNTHROID ) 88 MCG tablet Take 1 tablet (88 mcg total) by mouth daily before breakfast.   lovastatin  (MEVACOR ) 40 MG tablet TAKE 1 TABLET BY MOUTH EVERYDAY AT BEDTIME   omeprazole  (PRILOSEC) 20 MG capsule TAKE 1 CAPSULE BY MOUTH EVERY DAY   polyethylene glycol powder (GLYCOLAX /MIRALAX ) 17 GM/SCOOP powder Take 17 g by mouth 2 (two) times daily as needed for moderate constipation.   No facility-administered encounter medications on file as of 08/26/2024.   Hearing/Vision screen Hearing Screening - Comments:: Wears hearing aids Vision Screening - Comments:: Wears eyeglasses for reading - up to date with routine eye exams with Dr FABIENE Satchel Immunizations and Health Maintenance Health Maintenance  Topic Date Due   Hepatitis C Screening  Never done   COVID-19 Vaccine (5 - 2025-26 season) 06/14/2024   DTaP/Tdap/Td (2 - Tdap) 09/03/2024 (Originally 06/27/2018)   Medicare Annual Wellness (AWV)  08/26/2025   Pneumococcal Vaccine: 50+ Years  Completed   Influenza Vaccine  Completed   DEXA SCAN  Completed   Meningococcal B Vaccine  Aged Out   Mammogram  Discontinued    Colonoscopy  Discontinued   Zoster Vaccines- Shingrix  Discontinued        Assessment/Plan:  This is a routine wellness examination for [place].  Patient Care Team: Rollene Almarie LABOR, MD as PCP - General (Internal Medicine) Ivin Kocher, MD as Consulting Physician (Dermatology) Shari Sieving, MD (Orthopedic Surgery) Okey Arch, MD (Inactive) as Consulting Physician (Obstetrics and Gynecology) Melba Geofm Helling, MD as Referring Physician (Ophthalmology) Satchel Kurtz, OD as Consulting Physician (Optometry)  I have personally reviewed and noted the following in the patient's chart:   Medical and social history Use of alcohol, tobacco or illicit drugs  Current medications and supplements including opioid prescriptions. Functional ability and status Nutritional status Physical activity Advanced directives List of other physicians Hospitalizations, surgeries, and ER visits in previous 12 months Vitals Screenings to include cognitive, depression, and falls Referrals and appointments  No orders of the defined types were placed in this encounter.  In addition, I have reviewed and discussed with patient  certain preventive protocols, quality metrics, and best practice recommendations. A written personalized care plan for preventive services as well as general preventive health recommendations were provided to patient.   Brandi Ferguson, CMA   08/26/2024   Return in 1 year (on 08/26/2025).  After Visit Summary: (MyChart) Due to this being a telephonic visit, the after visit summary with patients personalized plan was offered to patient via MyChart   Nurse Notes: Scheduled CPE w/PCP for 09/2024.    Medical screening examination/treatment/procedure(s) were performed by non-physician practitioner and as supervising physician I was immediately available for consultation/collaboration.  I agree with above. Karlynn Noel, MD

## 2024-08-31 ENCOUNTER — Encounter: Payer: Self-pay | Admitting: Licensed Clinical Social Worker

## 2024-08-31 ENCOUNTER — Ambulatory Visit: Admitting: Licensed Clinical Social Worker

## 2024-08-31 DIAGNOSIS — F4323 Adjustment disorder with mixed anxiety and depressed mood: Secondary | ICD-10-CM

## 2024-08-31 NOTE — Progress Notes (Signed)
 [facility] Counselor/Therapist Progress Note  Patient ID: Brandi Ferguson, MRN: [id number]    Date: 08/31/24  Time Spent: 9:02  am - 10:00 am : 58 Minutes  Treatment Type: Individual Therapy.  Reported Symptoms: Patient appears emotionally distressed but engaged. No acute physical concerns noted during session.   Mental Status Exam: Appearance:  Well Groomed     Behavior: Appropriate and Sharing  Motor: Normal  Speech/Language:  Normal Rate  Affect: Appropriate  Mood: normal  Thought process: normal  Thought content:   WNL  Sensory/Perceptual disturbances:   WNL  Orientation: oriented to person, place, and time/date  Attention: Good  Concentration: Good  Memory: WNL  Fund of knowledge:  Good  Insight:   Good  Judgment:  Good  Impulse Control: Good   Risk Assessment: Danger to Self:  No Self-injurious Behavior: No Danger to Others: No Duty to Warn:no Physical Aggression / Violence:No  Access to Firearms a concern: No  Gang Involvement:No   Subjective:   Brandi Ferguson participated in the session, in person in the office with the therapist, and consented to treatment. Brandi Ferguson reviewed the events of the past week. reports ongoing concern for her husband's health, who has neglected personal health for over 40 years and has severe health issues. She feels frustrated with his functioning and household responsibilities. She maintains gratitude and faith despite her worries. She fears she may die before her husband, a concern stemming from past remarks. Recently, since her birthday, she has experienced increased anxiety about her mortality and ability to participate in upcoming family events, especially given her limited mobility and physical activity.      Interventions: Cognitive Behavioral Therapy and Mindfulness Meditation  Diagnosis:   Adjustment disorder with mixed anxiety and depressed mood   Psychiatric Treatment: No , N/A  Treatment  Plan:  Client Abilities/Strengths Liani is open to sessions.    Support System: Family and Friends   Client Treatment Preferences In person  Client Statement of Needs [name] would like to begin therapy and develop a plan to increase personal engagement and self care despite current limitations.      Treatment Level Biweekly  Symptoms  frustrated   (Status: maintained) angry   (Status: maintained) flared when husband had to be hospitalized last week  Goals:   Brandi Ferguson Anxiety related to husband's health, mortality fears, and recent mobility limitations. Physical health issues (GI, acid reflux) persist but are managed. Emotional impact of caregiving stress and mortality fears evident.  Continue supportive therapy focusing on coping strategies and emotional regulation. Introduce relaxation techniques via YouTube and [employer] [facility]	[place]; therapist will send samples through MyChart. Prepare for in-person session in two weeks. Monitor mood and anxiety levels; consider further interventions if needed. Next appointment: In-person, in two weeks.  Target Date: 10/12/24 Frequency: Biweekly  Progress: 0 Modality: individual    Therapist will provide referrals for additional resources as appropriate.  Therapist will provide psycho-education regarding [name]'s diagnosis and corresponding treatment approaches and interventions. Licensed Clinical Mental Health Counselor, Tawni Louder, [facility] will support the patient's ability to achieve the goals identified. will employ CBT, BA, Problem-solving, Solution Focused, Mindfulness,  coping skills, & other evidenced-based practices will be used to promote progress towards healthy functioning to help manage decrease symptoms associated with her diagnosis.   Reduce overall level, frequency, and intensity of the feelings of depression, anxiety and panic evidenced by decreased anxious thinking and sleep disturbances from 6 to 7 days/week to 0 to 1  days/week per  client report for at least 3 consecutive months. Verbally express understanding of the relationship between feelings of depression, anxiety and their impact on thinking patterns and behaviors. Verbalize an understanding of the role that distorted thinking plays in creating fears, excessive worry, and ruminations.  [name] participated in the creation of the treatment plan)      Tawni Louder, LCMHC

## 2024-09-14 ENCOUNTER — Ambulatory Visit: Admitting: Internal Medicine

## 2024-09-14 ENCOUNTER — Encounter: Payer: Self-pay | Admitting: Internal Medicine

## 2024-09-14 ENCOUNTER — Ambulatory Visit: Admitting: Licensed Clinical Social Worker

## 2024-09-14 VITALS — BP 120/60 | HR 88 | Temp 98.0°F | Ht 62.0 in | Wt 126.8 lb

## 2024-09-14 DIAGNOSIS — R5383 Other fatigue: Secondary | ICD-10-CM

## 2024-09-14 DIAGNOSIS — R531 Weakness: Secondary | ICD-10-CM | POA: Insufficient documentation

## 2024-09-14 DIAGNOSIS — K219 Gastro-esophageal reflux disease without esophagitis: Secondary | ICD-10-CM | POA: Insufficient documentation

## 2024-09-14 DIAGNOSIS — I10 Essential (primary) hypertension: Secondary | ICD-10-CM

## 2024-09-14 DIAGNOSIS — D508 Other iron deficiency anemias: Secondary | ICD-10-CM

## 2024-09-14 DIAGNOSIS — E039 Hypothyroidism, unspecified: Secondary | ICD-10-CM

## 2024-09-14 DIAGNOSIS — K582 Mixed irritable bowel syndrome: Secondary | ICD-10-CM

## 2024-09-14 DIAGNOSIS — F5101 Primary insomnia: Secondary | ICD-10-CM

## 2024-09-14 DIAGNOSIS — G4700 Insomnia, unspecified: Secondary | ICD-10-CM | POA: Insufficient documentation

## 2024-09-14 LAB — CBC
HCT: 29.6 % — ABNORMAL LOW (ref 36.0–46.0)
Hemoglobin: 9.6 g/dL — ABNORMAL LOW (ref 12.0–15.0)
MCHC: 32.4 g/dL (ref 30.0–36.0)
MCV: 70.6 fl — ABNORMAL LOW (ref 78.0–100.0)
Platelets: 674 K/uL — ABNORMAL HIGH (ref 150.0–400.0)
RBC: 4.19 Mil/uL (ref 3.87–5.11)
RDW: 20.1 % — ABNORMAL HIGH (ref 11.5–15.5)
WBC: 12.1 K/uL — ABNORMAL HIGH (ref 4.0–10.5)

## 2024-09-14 LAB — LIPID PANEL
Cholesterol: 109 mg/dL (ref 0–200)
HDL: 26.4 mg/dL — ABNORMAL LOW (ref >39.00)
LDL Cholesterol: 64 mg/dL (ref 0–99)
NonHDL: 82.13
Total CHOL/HDL Ratio: 4
Triglycerides: 92 mg/dL (ref 0.0–149.0)
VLDL: 18.4 mg/dL (ref 0.0–40.0)

## 2024-09-14 LAB — FERRITIN: Ferritin: 14.1 ng/mL (ref 10.0–291.0)

## 2024-09-14 LAB — COMPREHENSIVE METABOLIC PANEL WITH GFR
ALT: 21 U/L (ref 0–35)
AST: 62 U/L — ABNORMAL HIGH (ref 0–37)
Albumin: 3.5 g/dL (ref 3.5–5.2)
Alkaline Phosphatase: 245 U/L — ABNORMAL HIGH (ref 39–117)
BUN: 9 mg/dL (ref 6–23)
CO2: 24 meq/L (ref 19–32)
Calcium: 9.3 mg/dL (ref 8.4–10.5)
Chloride: 103 meq/L (ref 96–112)
Creatinine, Ser: 0.8 mg/dL (ref 0.40–1.20)
GFR: 71.73 mL/min (ref >60.00)
Glucose, Bld: 104 mg/dL — ABNORMAL HIGH (ref 70–99)
Potassium: 3.3 meq/L — ABNORMAL LOW (ref 3.5–5.1)
Sodium: 137 meq/L (ref 135–145)
Total Bilirubin: 1.1 mg/dL (ref 0.2–1.2)
Total Protein: 7.3 g/dL (ref 6.0–8.3)

## 2024-09-14 LAB — VITAMIN B12: Vitamin B-12: 557 pg/mL (ref 211–911)

## 2024-09-14 LAB — VITAMIN D 25 HYDROXY (VIT D DEFICIENCY, FRACTURES): VITD: 42.05 ng/mL (ref 30.00–100.00)

## 2024-09-14 LAB — TSH: TSH: 2.53 u[IU]/mL (ref 0.35–5.50)

## 2024-09-14 LAB — T4, FREE: Free T4: 1.03 ng/dL (ref 0.60–1.60)

## 2024-09-14 MED ORDER — PANTOPRAZOLE SODIUM 40 MG PO TBEC: 40.0000 mg | DELAYED_RELEASE_TABLET | Freq: Every day | ORAL | 3 refills | Status: DC

## 2024-09-14 MED ORDER — TRAZODONE HCL 50 MG PO TABS: 50.0000 mg | ORAL_TABLET | Freq: Every day | ORAL | 0 refills | Status: DC

## 2024-09-14 NOTE — Progress Notes (Signed)
 Subjective:   Patient ID: Brandi Ferguson, female    DOB: Feb 01, 1946, 78 y.o.   MRN: [id number]  Discussed the use of AI scribe software for clinical note transcription with the patient, who gave verbal consent to proceed.  History of Present Illness Brandi Ferguson is a 78 year old female with iron deficiency anemia and gastroesophageal reflux disease who presents with fatigue and worsening acid reflux.  She experiences significant fatigue and lack of energy, needing to sit down frequently after minimal activity. She has a history of low iron levels and received iron infusions last year. Her iron levels were last checked in February 2025. She feels cold easily and experiences body aches. No chest pain, tightness, or heart racing. She attributes her need to sit down to fatigue rather than breathlessness.  She describes worsening acid reflux symptoms, with difficulty swallowing pills as they feel stuck in her throat. She is currently taking omeprazole  20 mg daily, which has been effective for years but is no longer providing relief. She fears eating due to the sensation of food getting stuck.  Regarding her irritable bowel syndrome, she is attempting to manage it with over-the-counter medications after a negative experience with a previous gastroenterologist. She had an abdominal x-ray when constipated. No blood in her stool or black, tarry stools.  She reports significant sleep disturbances over the past eight weeks, with difficulty falling asleep and staying asleep. Her sleep is fragmented, waking after an hour and then intermittently throughout the night. She occasionally takes ibuprofen to help with sleep.  She expresses dissatisfaction with her current gastroenterologist and is seeking alternatives.   Review of Systems  Constitutional:  Positive for activity change and fatigue.  HENT: Negative.    Eyes: Negative.   Respiratory:  Negative for cough, chest tightness and shortness of  breath.   Cardiovascular:  Negative for chest pain, palpitations and leg swelling.  Gastrointestinal:  Negative for abdominal distention, abdominal pain, blood in stool, constipation, diarrhea, nausea and vomiting.  Musculoskeletal: Negative.   Skin: Negative.   Neurological: Negative.   Psychiatric/Behavioral: Negative.      Objective:  Physical Exam Constitutional:      Appearance: Normal appearance.  HENT:     Head: Normocephalic.  Cardiovascular:     Rate and Rhythm: Normal rate and regular rhythm.  Pulmonary:     Effort: Pulmonary effort is normal.  Abdominal:     General: Bowel sounds are normal. There is no distension.     Tenderness: There is no abdominal tenderness.  Musculoskeletal:        General: Normal range of motion.  Skin:    General: Skin is warm and dry.  Neurological:     General: No focal deficit present.     Mental Status: She is alert and oriented to person, place, and time.     Vitals:   09/14/24 0800  BP: 120/60  Pulse: 88  Temp: 98 F (36.7 C)  TempSrc: Oral  SpO2: 99%  Weight: 126 lb 12.8 oz (57.5 kg)  Height: 5' 2 (1.575 m)    Assessment and Plan Assessment & Plan Iron deficiency anemia   Chronic iron deficiency anemia presents with fatigue and possible recurrence. Ordered blood tests for iron, thyroid , and vitamin levels. Plan for iron infusion if iron levels are low.  Fatigue Checking labs including thyroid , vitamins, CBC, CMP, iron and adjust as needed  Hypothyroidism Checking TSH and free T4 with new severe fatigue  Gastroesophageal reflux disease (GERD)  GERD symptoms are worsening, and current omeprazole  treatment is ineffective. Prescribed Protonix  40 mg once daily.   Insomnia   Chronic insomnia with fragmented sleep and frequent awakenings. Prescribed trazodone  50 mg qhs for sleep initiation and maintenance.   Irritable bowel syndrome (IBS)   IBS with constipation and dissatisfaction with previous gastroenterologist  care. Discussed options for finding a new gastroenterologist.

## 2024-09-14 NOTE — Assessment & Plan Note (Signed)
 Checking labs including thyroid , vitamins, CBC, CMP, iron and adjust as needed

## 2024-09-14 NOTE — Assessment & Plan Note (Signed)
 Chronic iron deficiency anemia presents with fatigue and possible recurrence. Ordered blood tests for iron, thyroid , and vitamin levels. Plan for iron infusion if iron levels are low.

## 2024-09-14 NOTE — Assessment & Plan Note (Signed)
 Checking CBC and CMP and adjust as needed. BP at goal.

## 2024-09-14 NOTE — Assessment & Plan Note (Signed)
 Checking TSH and free T4 with new severe fatigue

## 2024-09-14 NOTE — Assessment & Plan Note (Signed)
 Chronic insomnia with fragmented sleep and frequent awakenings. Prescribed trazodone 50 mg qhs for sleep initiation and maintenance.

## 2024-09-14 NOTE — Patient Instructions (Signed)
 We will check the labs.  We have sent in trazodone  for sleep to take about 30 minutes before bed.  We have sent in protonix  to take in the morning to help with heartburn instead of the omeprazole .

## 2024-09-14 NOTE — Assessment & Plan Note (Signed)
 GERD symptoms are worsening, and current omeprazole  treatment is ineffective. Prescribed Protonix 40 mg once daily.

## 2024-09-14 NOTE — Assessment & Plan Note (Signed)
 IBS with constipation and dissatisfaction with previous gastroenterologist care. Discussed options for finding a new gastroenterologist.

## 2024-09-15 ENCOUNTER — Other Ambulatory Visit: Payer: Self-pay | Admitting: Internal Medicine

## 2024-09-15 ENCOUNTER — Ambulatory Visit: Payer: Self-pay | Admitting: Internal Medicine

## 2024-09-16 ENCOUNTER — Other Ambulatory Visit (HOSPITAL_COMMUNITY): Payer: Self-pay | Admitting: Pharmacist

## 2024-09-16 ENCOUNTER — Telehealth: Payer: Self-pay

## 2024-09-16 NOTE — Telephone Encounter (Signed)
 Dr. Rollene, patient will be scheduled as soon as possible.  Auth Submission: NO AUTH NEEDED Site of care: Site of care: [facility] Payer: [employer] state health plan Medication & CPT/J Code(s) submitted: Venofer (Iron Sucrose) J1756 Diagnosis Code:  Route of submission (phone, fax, portal):  Phone # Fax # Auth type: Buy/Bill PB Units/visits requested: 200mg  x 5 doses Reference number:  Approval from: 09/16/24 to 10/13/24

## 2024-09-22 ENCOUNTER — Inpatient Hospital Stay: Admission: RE | Admit: 2024-09-22 | Discharge: 2024-09-22 | Attending: Internal Medicine | Admitting: Internal Medicine

## 2024-09-22 DIAGNOSIS — Z1231 Encounter for screening mammogram for malignant neoplasm of breast: Secondary | ICD-10-CM

## 2024-09-23 ENCOUNTER — Ambulatory Visit

## 2024-09-23 VITALS — BP 133/83 | HR 81 | Temp 98.1°F | Resp 16 | Ht 62.0 in | Wt 127.2 lb

## 2024-09-23 DIAGNOSIS — D508 Other iron deficiency anemias: Secondary | ICD-10-CM

## 2024-09-23 MED ADMIN — Iron Sucrose Inj 20 MG/ML (Fe Equiv): 200 mg | INTRAVENOUS | NDC 99999050113

## 2024-09-23 MED FILL — Iron Sucrose Inj 20 MG/ML (Fe Equiv): 200.0000 mg | Qty: 110 | Status: AC

## 2024-09-23 NOTE — Patient Instructions (Signed)

## 2024-09-23 NOTE — Progress Notes (Signed)
 Diagnosis: Iron Deficiency Anemia  Provider:  Praveen Mannam MD  Procedure: IV Infusion  IV Type: Peripheral, IV Location: L Forearm   Venofer (Iron Sucrose), Dose: 200 mg  Infusion Start Time: 0905  Infusion Stop Time: 0921  Post Infusion IV Care: Observation period completed and Peripheral IV Discontinued  Discharge: Condition: Good, Destination: Home . AVS Provided  Performed by:  Maximiano JONELLE Pouch, LPN

## 2024-09-27 ENCOUNTER — Ambulatory Visit

## 2024-09-28 ENCOUNTER — Ambulatory Visit: Payer: Self-pay | Admitting: Internal Medicine

## 2024-09-29 ENCOUNTER — Ambulatory Visit

## 2024-09-29 VITALS — BP 114/66 | HR 72 | Temp 97.7°F | Resp 18 | Ht 62.0 in | Wt 123.2 lb

## 2024-09-29 DIAGNOSIS — D508 Other iron deficiency anemias: Secondary | ICD-10-CM

## 2024-09-29 MED ORDER — IRON SUCROSE 200 MG IVPB - SIMPLE MED
200.0000 mg | Freq: Once | Status: AC
  Administered 2024-09-29: 09:00:00 200 mg via INTRAVENOUS
  Filled 2024-09-29: qty 110

## 2024-09-29 NOTE — Progress Notes (Signed)
 Diagnosis: , Iron  Deficiency Anemia  Provider:  Praveen Mannam MD  Procedure: IV Infusion  IV Type: Peripheral, IV Location: R Antecubital  , Venofer  (Iron  Sucrose), Dose: 200 mg  Infusion Start Time: 0846  Infusion Stop Time: 0905  Post Infusion IV Care: Observation period completed and Peripheral IV Discontinued  Discharge: Condition: Good, Destination: Home . AVS Declined  Performed by:  Latayvia Mandujano, RN

## 2024-09-30 NOTE — Progress Notes (Signed)
Pt is aware of mammogram results.  

## 2024-10-01 ENCOUNTER — Encounter (HOSPITAL_COMMUNITY): Payer: Self-pay

## 2024-10-01 ENCOUNTER — Observation Stay (HOSPITAL_COMMUNITY)

## 2024-10-01 ENCOUNTER — Ambulatory Visit

## 2024-10-01 ENCOUNTER — Other Ambulatory Visit: Payer: Self-pay

## 2024-10-01 ENCOUNTER — Inpatient Hospital Stay (HOSPITAL_COMMUNITY): Admission: EM | Admit: 2024-10-01 | Discharge: 2024-10-14 | Disposition: E

## 2024-10-01 ENCOUNTER — Emergency Department (HOSPITAL_COMMUNITY)

## 2024-10-01 DIAGNOSIS — E43 Unspecified severe protein-calorie malnutrition: Secondary | ICD-10-CM | POA: Insufficient documentation

## 2024-10-01 DIAGNOSIS — R0603 Acute respiratory distress: Secondary | ICD-10-CM | POA: Diagnosis not present

## 2024-10-01 DIAGNOSIS — E162 Hypoglycemia, unspecified: Secondary | ICD-10-CM | POA: Diagnosis not present

## 2024-10-01 DIAGNOSIS — I468 Cardiac arrest due to other underlying condition: Secondary | ICD-10-CM | POA: Diagnosis not present

## 2024-10-01 DIAGNOSIS — R918 Other nonspecific abnormal finding of lung field: Secondary | ICD-10-CM | POA: Diagnosis present

## 2024-10-01 DIAGNOSIS — I10 Essential (primary) hypertension: Secondary | ICD-10-CM | POA: Diagnosis present

## 2024-10-01 DIAGNOSIS — R6510 Systemic inflammatory response syndrome (SIRS) of non-infectious origin without acute organ dysfunction: Secondary | ICD-10-CM | POA: Diagnosis present

## 2024-10-01 DIAGNOSIS — D509 Iron deficiency anemia, unspecified: Secondary | ICD-10-CM | POA: Diagnosis present

## 2024-10-01 DIAGNOSIS — F419 Anxiety disorder, unspecified: Secondary | ICD-10-CM | POA: Diagnosis present

## 2024-10-01 DIAGNOSIS — R578 Other shock: Secondary | ICD-10-CM | POA: Diagnosis not present

## 2024-10-01 DIAGNOSIS — Z515 Encounter for palliative care: Secondary | ICD-10-CM

## 2024-10-01 DIAGNOSIS — R54 Age-related physical debility: Secondary | ICD-10-CM | POA: Diagnosis present

## 2024-10-01 DIAGNOSIS — Z6822 Body mass index (BMI) 22.0-22.9, adult: Secondary | ICD-10-CM

## 2024-10-01 DIAGNOSIS — C189 Malignant neoplasm of colon, unspecified: Secondary | ICD-10-CM | POA: Diagnosis present

## 2024-10-01 DIAGNOSIS — R634 Abnormal weight loss: Secondary | ICD-10-CM | POA: Diagnosis present

## 2024-10-01 DIAGNOSIS — C787 Secondary malignant neoplasm of liver and intrahepatic bile duct: Principal | ICD-10-CM | POA: Diagnosis present

## 2024-10-01 DIAGNOSIS — E039 Hypothyroidism, unspecified: Secondary | ICD-10-CM | POA: Diagnosis present

## 2024-10-01 DIAGNOSIS — E785 Hyperlipidemia, unspecified: Secondary | ICD-10-CM | POA: Diagnosis present

## 2024-10-01 DIAGNOSIS — F32A Depression, unspecified: Secondary | ICD-10-CM | POA: Diagnosis present

## 2024-10-01 DIAGNOSIS — Z66 Do not resuscitate: Secondary | ICD-10-CM | POA: Diagnosis not present

## 2024-10-01 DIAGNOSIS — Z800 Family history of malignant neoplasm of digestive organs: Secondary | ICD-10-CM

## 2024-10-01 DIAGNOSIS — K580 Irritable bowel syndrome with diarrhea: Secondary | ICD-10-CM | POA: Diagnosis present

## 2024-10-01 DIAGNOSIS — E876 Hypokalemia: Secondary | ICD-10-CM | POA: Diagnosis present

## 2024-10-01 DIAGNOSIS — Z8249 Family history of ischemic heart disease and other diseases of the circulatory system: Secondary | ICD-10-CM

## 2024-10-01 DIAGNOSIS — K922 Gastrointestinal hemorrhage, unspecified: Secondary | ICD-10-CM | POA: Diagnosis not present

## 2024-10-01 DIAGNOSIS — Z79890 Hormone replacement therapy (postmenopausal): Secondary | ICD-10-CM

## 2024-10-01 DIAGNOSIS — Z860101 Personal history of adenomatous and serrated colon polyps: Secondary | ICD-10-CM

## 2024-10-01 DIAGNOSIS — R109 Unspecified abdominal pain: Principal | ICD-10-CM

## 2024-10-01 DIAGNOSIS — N179 Acute kidney failure, unspecified: Secondary | ICD-10-CM | POA: Diagnosis present

## 2024-10-01 DIAGNOSIS — E441 Mild protein-calorie malnutrition: Secondary | ICD-10-CM | POA: Diagnosis present

## 2024-10-01 DIAGNOSIS — G9341 Metabolic encephalopathy: Secondary | ICD-10-CM | POA: Diagnosis not present

## 2024-10-01 DIAGNOSIS — Z90710 Acquired absence of both cervix and uterus: Secondary | ICD-10-CM

## 2024-10-01 DIAGNOSIS — Z79899 Other long term (current) drug therapy: Secondary | ICD-10-CM

## 2024-10-01 DIAGNOSIS — E872 Acidosis, unspecified: Secondary | ICD-10-CM | POA: Diagnosis present

## 2024-10-01 DIAGNOSIS — I471 Supraventricular tachycardia, unspecified: Secondary | ICD-10-CM | POA: Diagnosis present

## 2024-10-01 DIAGNOSIS — R531 Weakness: Secondary | ICD-10-CM

## 2024-10-01 LAB — URINALYSIS, ROUTINE W REFLEX MICROSCOPIC
Bilirubin Urine: NEGATIVE
Glucose, UA: NEGATIVE mg/dL
Hgb urine dipstick: NEGATIVE
Ketones, ur: NEGATIVE mg/dL
Leukocytes,Ua: NEGATIVE
Nitrite: NEGATIVE
Protein, ur: NEGATIVE mg/dL
Specific Gravity, Urine: 1.012 (ref 1.005–1.030)
pH: 5 (ref 5.0–8.0)

## 2024-10-01 LAB — TYPE AND SCREEN
ABO/RH(D): O POS
Antibody Screen: NEGATIVE

## 2024-10-01 LAB — CBC
HCT: 32.1 % — ABNORMAL LOW (ref 36.0–46.0)
Hemoglobin: 10.1 g/dL — ABNORMAL LOW (ref 12.0–15.0)
MCH: 22.6 pg — ABNORMAL LOW (ref 26.0–34.0)
MCHC: 31.5 g/dL (ref 30.0–36.0)
MCV: 72 fL — ABNORMAL LOW (ref 80.0–100.0)
Platelets: 631 K/uL — ABNORMAL HIGH (ref 150–400)
RBC: 4.46 MIL/uL (ref 3.87–5.11)
RDW: 23.5 % — ABNORMAL HIGH (ref 11.5–15.5)
WBC: 18.6 K/uL — ABNORMAL HIGH (ref 4.0–10.5)
nRBC: 0 % (ref 0.0–0.2)

## 2024-10-01 LAB — PROTIME-INR
INR: 1.3 — ABNORMAL HIGH (ref 0.8–1.2)
Prothrombin Time: 17.2 s — ABNORMAL HIGH (ref 11.4–15.2)

## 2024-10-01 LAB — COMPREHENSIVE METABOLIC PANEL WITH GFR
ALT: 30 U/L (ref 0–44)
AST: 112 U/L — ABNORMAL HIGH (ref 15–41)
Albumin: 3.3 g/dL — ABNORMAL LOW (ref 3.5–5.0)
Alkaline Phosphatase: 374 U/L — ABNORMAL HIGH (ref 38–126)
Anion gap: 16 — ABNORMAL HIGH (ref 5–15)
BUN: 12 mg/dL (ref 8–23)
CO2: 20 mmol/L — ABNORMAL LOW (ref 22–32)
Calcium: 9.2 mg/dL (ref 8.9–10.3)
Chloride: 104 mmol/L (ref 98–111)
Creatinine, Ser: 0.89 mg/dL (ref 0.44–1.00)
GFR, Estimated: >60 mL/min
Glucose, Bld: 104 mg/dL — ABNORMAL HIGH (ref 70–99)
Potassium: 2.8 mmol/L — ABNORMAL LOW (ref 3.5–5.1)
Sodium: 140 mmol/L (ref 135–145)
Total Bilirubin: 1.3 mg/dL — ABNORMAL HIGH (ref 0.0–1.2)
Total Protein: 7.1 g/dL (ref 6.5–8.1)

## 2024-10-01 LAB — LIPASE, BLOOD: Lipase: 25 U/L (ref 11–51)

## 2024-10-01 LAB — MAGNESIUM: Magnesium: 1.8 mg/dL (ref 1.7–2.4)

## 2024-10-01 LAB — LACTIC ACID, PLASMA
Lactic Acid, Venous: 2.7 mmol/L (ref 0.5–1.9)
Lactic Acid, Venous: 2.5 mmol/L (ref 0.5–1.9)

## 2024-10-01 LAB — TSH: TSH: 1.11 u[IU]/mL (ref 0.350–4.500)

## 2024-10-01 MED ORDER — PIPERACILLIN-TAZOBACTAM 3.375 G IVPB
3.3750 g | Freq: Three times a day (TID) | INTRAVENOUS | Status: DC
  Administered 2024-10-01 – 2024-10-02 (×3): 3.375 g via INTRAVENOUS
  Filled 2024-10-01 (×3): qty 50

## 2024-10-01 MED ORDER — IOHEXOL 300 MG/ML  SOLN
100.0000 mL | Freq: Once | INTRAMUSCULAR | Status: AC | PRN
  Administered 2024-10-01: 60 mL via INTRAVENOUS

## 2024-10-01 MED ORDER — ONDANSETRON HCL 4 MG/2ML IJ SOLN: 4.0000 mg | Freq: Four times a day (QID) | INTRAMUSCULAR | Status: DC | PRN

## 2024-10-01 MED ORDER — LACTATED RINGERS IV BOLUS
2000.0000 mL | Freq: Once | INTRAVENOUS | Status: AC
  Administered 2024-10-01: 2000 mL via INTRAVENOUS

## 2024-10-01 MED ORDER — LACTATED RINGERS IV SOLN
INTRAVENOUS | Status: DC
  Administered 2024-10-04: 1000 mL via INTRAVENOUS

## 2024-10-01 MED ORDER — TRAZODONE HCL 50 MG PO TABS
25.0000 mg | ORAL_TABLET | Freq: Every evening | ORAL | Status: DC | PRN
  Administered 2024-10-01 – 2024-10-03 (×3): 25 mg via ORAL
  Filled 2024-10-01 (×3): qty 1

## 2024-10-01 MED ORDER — LEVOTHYROXINE SODIUM 88 MCG PO TABS
88.0000 ug | ORAL_TABLET | Freq: Every day | ORAL | Status: DC
  Administered 2024-10-02 – 2024-10-04 (×3): 88 ug via ORAL
  Filled 2024-10-01 (×3): qty 1

## 2024-10-01 MED ORDER — OXYCODONE HCL 5 MG PO TABS
5.0000 mg | ORAL_TABLET | ORAL | Status: DC | PRN
  Administered 2024-10-01 – 2024-10-04 (×8): 5 mg via ORAL
  Filled 2024-10-01 (×8): qty 1

## 2024-10-01 MED ORDER — METOPROLOL TARTRATE 5 MG/5ML IV SOLN
5.0000 mg | Freq: Once | INTRAVENOUS | Status: AC
  Administered 2024-10-01: 5 mg via INTRAVENOUS
  Filled 2024-10-01: qty 5

## 2024-10-01 MED ORDER — ALBUTEROL SULFATE (2.5 MG/3ML) 0.083% IN NEBU: 2.5000 mg | INHALATION_SOLUTION | RESPIRATORY_TRACT | Status: DC | PRN

## 2024-10-01 MED ORDER — FENTANYL CITRATE (PF) 50 MCG/ML IJ SOSY
12.5000 ug | PREFILLED_SYRINGE | INTRAMUSCULAR | Status: DC | PRN
  Administered 2024-10-05 – 2024-10-06 (×4): 50 ug via INTRAVENOUS
  Filled 2024-10-01 (×4): qty 1

## 2024-10-01 MED ORDER — ACETAMINOPHEN 650 MG RE SUPP: 650.0000 mg | Freq: Four times a day (QID) | RECTAL | Status: DC | PRN

## 2024-10-01 MED ORDER — IRON SUCROSE 200 MG IVPB - SIMPLE MED: 200.0000 mg | Freq: Once | Status: AC

## 2024-10-01 MED ORDER — MORPHINE SULFATE (PF) 4 MG/ML IV SOLN
4.0000 mg | Freq: Once | INTRAVENOUS | Status: AC
  Administered 2024-10-01: 4 mg via INTRAVENOUS
  Filled 2024-10-01: qty 1

## 2024-10-01 MED ORDER — IOHEXOL 300 MG/ML  SOLN
100.0000 mL | Freq: Once | INTRAMUSCULAR | Status: AC | PRN
  Administered 2024-10-01: 90 mL via INTRAVENOUS

## 2024-10-01 MED ORDER — ENSURE PLUS HIGH PROTEIN PO LIQD
237.0000 mL | Freq: Two times a day (BID) | ORAL | Status: DC
  Administered 2024-10-03: 237 mL via ORAL

## 2024-10-01 MED ORDER — POTASSIUM CHLORIDE CRYS ER 20 MEQ PO TBCR
40.0000 meq | EXTENDED_RELEASE_TABLET | Freq: Once | ORAL | Status: AC
  Administered 2024-10-01: 40 meq via ORAL
  Filled 2024-10-01: qty 2

## 2024-10-01 MED ORDER — ALPRAZOLAM 0.5 MG PO TABS
0.5000 mg | ORAL_TABLET | Freq: Three times a day (TID) | ORAL | Status: DC | PRN
  Administered 2024-10-01 – 2024-10-03 (×5): 0.5 mg via ORAL
  Filled 2024-10-01 (×5): qty 1

## 2024-10-01 MED ORDER — ONDANSETRON HCL 4 MG PO TABS: 4.0000 mg | ORAL_TABLET | Freq: Four times a day (QID) | ORAL | Status: DC | PRN

## 2024-10-01 MED ORDER — ACETAMINOPHEN 325 MG PO TABS: 650.0000 mg | ORAL_TABLET | Freq: Four times a day (QID) | ORAL | Status: DC | PRN

## 2024-10-01 MED ORDER — ORAL CARE MOUTH RINSE: 15.0000 mL | OROMUCOSAL | Status: DC | PRN

## 2024-10-01 MED ORDER — POTASSIUM CHLORIDE 10 MEQ/100ML IV SOLN
10.0000 meq | Freq: Once | INTRAVENOUS | Status: AC
  Administered 2024-10-01: 10 meq via INTRAVENOUS
  Filled 2024-10-01: qty 100

## 2024-10-01 MED ORDER — PIPERACILLIN-TAZOBACTAM 3.375 G IVPB 30 MIN
3.3750 g | Freq: Once | INTRAVENOUS | Status: AC
  Administered 2024-10-01: 3.375 g via INTRAVENOUS
  Filled 2024-10-01: qty 50

## 2024-10-01 NOTE — Consult Note (Addendum)
 "  Chief Complaint: Abdominal pain, weight loss, weakness, diminished appetite; liver lesions, asymmetric mural thickening of colon; referred for image guided liver lesion biopsy for further evaluation  Referring Provider(s): Ikramullah,M  Supervising Physician: Philip Cornet  Patient Status: [facility] - In-pt  History of Present Illness: Brandi Ferguson is a 78 y.o. female with past medical history significant for colon polyp, anxiety, depression, diverticulosis, hyperlipidemia, hypertension, hypothyroidism, IBS, who was recently admitted to [facility] with persistent abdominal pain, weakness, weight loss, SVT,diminished appetite and imaging findings of liver lesions along with asymmetric mural thickening of colon .  Patient has no known history of cancer but has a sister who died of colon cancer.  CA125, CA 19-9, AFP and C CEA are pending.  Request now received from admitting team for image guided liver lesion biopsy for further evaluation.   Patient is Full Code  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   Anxiety 1967   Have had anxiety started in high school   ANXIETY DEPRESSION 02/06/2008   Cataract Surgery 04/2022   Cough 09/27/2008   DIVERTICULOSIS OF COLON 02/06/2008   HYPERLIPIDEMIA 06/27/2008   HYPERTENSION 02/06/2008   HYPOTHYROIDISM 02/06/2008   Irritable bowel syndrome 02/06/2008   Lump or mass in breast 02/06/2008   Overweight(278.02) 06/27/2008   SINUSITIS- ACUTE-NOS 01/04/2010   URI 09/04/2009    Past Surgical History:  Procedure Laterality Date   ABDOMINAL HYSTERECTOMY  May 2011   Total hysterectomy   BREAST EXCISIONAL BIOPSY Right 1984   BREAST SURGERY  1984   Right lumpectomy- Benign   CERVICAL CONE BIOPSY  1977   EYE SURGERY  Biopsy lt eye 2020   Cataract surgery on both eyes 04/2022   LAPAROSCOPY  1972   Fertility work-up   SKIN LESION EXCISION  2006   Left breast   TONSILLECTOMY      Allergies: Patient has  no known allergies.  Medications: Prior to Admission medications  Medication Sig Start Date End Date Taking? Authorizing Provider  ALPRAZolam  (XANAX ) 0.5 MG tablet Take 1 tablet (0.5 mg total) by mouth 3 (three) times daily as needed. 04/21/24   Rollene Almarie LABOR, MD  hydrochlorothiazide  (HYDRODIURIL ) 25 MG tablet Take 0.5 tablets (12.5 mg total) by mouth daily. 09/08/23   Rollene Almarie LABOR, MD  levothyroxine  (SYNTHROID ) 88 MCG tablet Take 1 tablet (88 mcg total) by mouth daily before breakfast. 06/03/24   Rollene Almarie LABOR, MD  lovastatin  (MEVACOR ) 40 MG tablet TAKE 1 TABLET BY MOUTH EVERYDAY AT BEDTIME 03/17/24   Rollene Almarie LABOR, MD  pantoprazole  (PROTONIX ) 40 MG tablet Take 1 tablet (40 mg total) by mouth daily. 09/14/24   Rollene Almarie LABOR, MD  polyethylene glycol powder (GLYCOLAX /MIRALAX ) 17 GM/SCOOP powder Take 17 g by mouth 2 (two) times daily as needed for moderate constipation. 04/21/24   Rollene Almarie LABOR, MD  traZODone  (DESYREL ) 50 MG tablet Take 1 tablet (50 mg total) by mouth at bedtime. 09/14/24   Rollene Almarie LABOR, MD     Family History  Problem Relation Age of Onset   Alzheimer's disease Mother    Alcohol abuse Father    Diabetes Father    Hyperlipidemia Father    Hypertension Father    Coronary artery disease Father    Cancer Sister     Social History   Socioeconomic History   Marital status: Married    Spouse name: Marinell   Number of children: 1   Years of education:  16   Highest education level: Bachelor's degree (e.g., BA, AB, BS)  Occupational History   Occupation: Retired/ Horticulturist, commercial  Tobacco Use   Smoking status: Never   Smokeless tobacco: Never  Vaping Use   Vaping status: Never Used  Substance and Sexual Activity   Alcohol  use: No   Drug use: No   Sexual activity: Yes    Partners: Male  Other Topics Concern   Not on file  Social History Narrative   HSG, Completed [employer] BA -ed. Married 97, 1 adopted  daughter 22. 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 Marriage in [place]. 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 Prolonged artificial hydration or nutrition.    Social Drivers of Health   Tobacco Use: Low Risk (10/01/2024)   Patient History    Smoking Tobacco Use: Never    Smokeless Tobacco Use: Never    Passive Exposure: Not on file  Financial Resource Strain: Low Risk (09/13/2024)   Overall Financial Resource Strain (CARDIA)    Difficulty of Paying Living Expenses: Not hard at all  Food Insecurity: No Food Insecurity (09/13/2024)   Epic    Worried About Programme Researcher, Broadcasting/film/video in the Last Year: Never true    Ran Out of Food in the Last Year: Never true  Transportation Needs: No Transportation Needs (09/13/2024)   Epic    Lack of Transportation (Medical): No    Lack of Transportation (Non-Medical): No  Physical Activity: Insufficiently Active (09/13/2024)   Exercise Vital Sign    Days of Exercise per Week: 2 days    Minutes of Exercise per Session: 20 min  Stress: Stress Concern Present (09/13/2024)   [employer] of Occupational Health - Occupational Stress Questionnaire    Feeling of Stress: Very much  Social Connections: Unknown (09/13/2024)   Social Connection and Isolation Panel    Frequency of Communication with Friends and Family: Twice a week    Frequency of Social Gatherings with Friends and Family: Once a week    Attends Religious Services: More than 4 times per year    Active Member of Clubs or Organizations: Yes    Attends Banker Meetings: More than 4 times per year    Marital Status: Not on file  Depression (PHQ2-9): Low Risk (08/26/2024)   Depression (PHQ2-9)    PHQ-2 Score: 0  Alcohol  Screen: Low Risk (09/04/2023)   Alcohol  Screen    Last Alcohol  Screening Score (AUDIT): 0  Housing: Low Risk (09/13/2024)   Epic    Unable to  Pay for Housing in the Last Year: No    Number of Times Moved in the Last Year: 0    Homeless in the Last Year: No  Utilities: Not At Risk (08/26/2024)   Epic    Threatened with loss of utilities: No  Health Literacy: Adequate Health Literacy (08/26/2024)   B1300 Health Literacy    Frequency of need for help with medical instructions: Never       Review of Systems: See above.  Currently denies fever, headache, chest pain, cough, back pain, nausea, vomiting or bleeding.  Vital Signs: BP (!) 152/67 (BP Location: Right Arm)   Pulse 86   Temp 97.6 F (36.4 C)   Resp 18   Ht 5' 2 (1.575 m)   Wt 123 lb (55.8 kg)   SpO2 100%   BMI 22.50  kg/m   Advance Care Plan: No documents on file  Physical Exam: Awake, alert.  Chest clear to auscultation bilaterally.  Heart with regular rate and rhythm.  Abdomen soft, positive bowel sounds, some RUQ/epigastric tenderness to palpation.  No lower extremity edema.  Imaging: CT ABDOMEN PELVIS W CONTRAST Addendum Date: 10/01/2024 ADDENDUM REPORT: 10/01/2024 14:17 ADDENDUM: Upon further evaluation, asymmetric mural thickening is seen involving the proximal to mid ascending colon (axial CT images 47 through 56, CT series 2/coronal reformatted images 64 through 95, CT series 7.) These findings, along with the fluid and inflammatory stranding seen within this region may represent a primary malignancy involving the ascending colon. Electronically Signed   By: Suzen Dials M.D.   On: 10/01/2024 14:17   Result Date: 10/01/2024 CLINICAL DATA:  Centralized abdominal pain that moves across her stomach. Patient stated she has lost 10 pounds in the last 7 weeks and feels extremely weak. EXAM: CT ABDOMEN AND PELVIS WITH CONTRAST TECHNIQUE: Multidetector CT imaging of the abdomen and pelvis was performed using the standard protocol following bolus administration of intravenous contrast. RADIATION DOSE REDUCTION: This exam was performed according to the  departmental dose-optimization program which includes automated exposure control, adjustment of the mA and/or kV according to patient size and/or use of iterative reconstruction technique. CONTRAST:  90mL OMNIPAQUE  IOHEXOL  300 MG/ML  SOLN COMPARISON:  None Available. FINDINGS: Lower chest: No acute abnormality. Hepatobiliary: Numerous heterogeneous low-attenuation liver mass is are seen. A small layer of gallstones versus gallbladder sludge is seen within the dependent portion of the gallbladder lumen. There is no evidence of biliary dilatation. Pancreas: Unremarkable. No pancreatic ductal dilatation or surrounding inflammatory changes. Spleen: Normal in size without focal abnormality. Adrenals/Urinary Tract: Adrenal glands are unremarkable. The left kidney is not clearly identified and appears to be markedly atrophic. There is mild compensatory hypertrophy of the right kidney with a subcentimeter simple renal cyst seen within the anterior aspect of the upper pole. No renal calculi or hydronephrosis is identified. The urinary bladder is markedly distended and is otherwise unremarkable. Stomach/Bowel: There is a small hiatal hernia. The appendix is not clearly identified stool is seen throughout the large bowel. No evidence of bowel dilatation. Numerous diverticula are seen throughout the large bowel and distal duodenum. A small amount of Peri cecal fluid is seen with a small amount of peri-intestinal fluid noted within the mid to lower right abdomen. A mild amount of inflammatory fat stranding is seen along the mid to lower paracolic gutter on the right. Vascular/Lymphatic: Aortic atherosclerosis. No enlarged abdominal or pelvic lymph nodes. Reproductive: Status post hysterectomy. No adnexal masses. Other: No abdominal wall hernia or abnormality. No abdominopelvic ascites. Musculoskeletal: Multilevel degenerative changes are seen throughout the lumbar spine. IMPRESSION: 1. Numerous heterogeneous low-attenuation  liver masses, consistent with metastatic disease. 2. Small amount of peri-intestinal fluid within the mid to lower right abdomen with mild right paracolic gutter inflammatory fat stranding which may represent the presence of an underlying inflammatory or infectious process. 3. Small hiatal hernia. 4. Cholelithiasis versus gallbladder sludge. 5. Markedly distended urinary bladder. 6. Colonic and distal duodenal diverticulosis. 7. Markedly atrophic left kidney. 8. Aortic atherosclerosis. Electronically Signed: By: Suzen Dials M.D. On: 10/01/2024 11:33   CT CHEST W CONTRAST Result Date: 10/01/2024 CLINICAL DATA:  Liver mets looking for primary. * Tracking Code: BO * EXAM: CT CHEST WITH CONTRAST TECHNIQUE: Multidetector CT imaging of the chest was performed during intravenous contrast administration. RADIATION DOSE REDUCTION: This exam was performed according to  the departmental dose-optimization program which includes automated exposure control, adjustment of the mA and/or kV according to patient size and/or use of iterative reconstruction technique. CONTRAST:  60mL OMNIPAQUE IOHEXOL 300 MG/ML  SOLN COMPARISON:  None Available. FINDINGS: Cardiovascular: Normal cardiac size. No pericardial effusion. No aortic aneurysm. There are coronary artery calcifications, in keeping with coronary artery disease. There are also mild peripheral atherosclerotic vascular calcifications of thoracic aorta and its major branches. Mediastinum/Nodes: Visualized thyroid  gland appears grossly unremarkable. No solid / cystic mediastinal masses. The esophagus is nondistended precluding optimal assessment. No axillary, mediastinal or hilar lymphadenopathy by size criteria. Lungs/Pleura: The central tracheo-bronchial tree is patent. There are patchy areas of linear, plate-like atelectasis and/or scarring throughout bilateral lungs. Single emphysematous bleb noted in the right lung lower lobe, posterior inferiorly. No mass or  consolidation. No pleural effusion or pneumothorax. There are multiple sub 4 mm solid noncalcified nodules throughout bilateral lungs (marked with electronic arrow sign on series 2004). Please see follow-up recommendations below. Upper Abdomen: Please refer to same-day acquired CT scan abdomen pelvis report for additional details. Musculoskeletal: The visualized soft tissues of the chest wall are grossly unremarkable. No suspicious osseous lesions. There are mild to moderate multilevel degenerative changes in the visualized spine. IMPRESSION: 1. No metastatic disease identified within the chest. 2. Multiple, sub 4 mm, solid noncalcified nodules throughout bilateral lungs. Please see follow-up recommendations below. 3. Multiple other nonacute observations, as described above. Aortic Atherosclerosis (ICD10-I70.0). Pulmonary nodule follow-up recommendation: Solid lung nodules measuring up to 6 mm: - LOW RISK: No routine follow-up. - HIGHER RISK: Optional CT at 12 months. Recommendations for evaluation of incidental nodules derived from guidelines developed by the Fleischner Society ( Radiology 2017; [phone]). These guidelines apply to incidental nodules, which can be managed according to the specific recommendations. These guidelines do not apply to patient's younger than 35 years, immunocompromised patients, or patients with cancer. For lung cancer screening, adherence to the existing [employer] of radiology lung CT Screening Reporting and Data System (lung-RADS) guidelines is recommended. High risk factors include older age, heavy smoking, larger nodule size, irregular or spiculated margins and upper lobe location. Clinical risk factors include smoking, exposure to other carcinogens, emphysema, fibrosis, upper lobe location, family history of lung cancer, age and sex. Electronically Signed   By: Ree Molt M.D.   On: 10/01/2024 13:43   MM 3D SCREENING MAMMOGRAM BILATERAL BREAST Result Date:  09/28/2024 CLINICAL DATA:  Screening. EXAM: DIGITAL SCREENING BILATERAL MAMMOGRAM WITH TOMOSYNTHESIS AND CAD TECHNIQUE: Bilateral screening digital craniocaudal and mediolateral oblique mammograms were obtained. Bilateral screening digital breast tomosynthesis was performed. The images were evaluated with computer-aided detection. COMPARISON:  Previous exam(s). ACR Breast Density Category b: There are scattered areas of fibroglandular density. FINDINGS: There are no findings suspicious for malignancy. IMPRESSION: No mammographic evidence of malignancy. A result letter of this screening mammogram will be mailed directly to the patient. RECOMMENDATION: Screening mammogram in one year. (Code:[id number]-B-01Y) BI-RADS CATEGORY  1: Negative. Electronically Signed   By: Corean Salter M.D.   On: 09/28/2024 08:11    Labs:  CBC: Recent Labs    11/19/23 0948 09/14/24 0825 10/01/24 0840  WBC 8.7 12.1* 18.6*  HGB 13.7 9.6* 10.1*  HCT 40.3 29.6* 32.1*  PLT 440.0* 674.0* 631*    COAGS: No results for input(s): INR, APTT in the last 8760 hours.  BMP: Recent Labs    09/14/24 0825 10/01/24 0840  NA 137 140  K 3.3* 2.8*  CL 103 104  CO2 24 20*  GLUCOSE 104* 104*  BUN 9 12  CALCIUM 9.3 9.2  CREATININE 0.80 0.89  GFRNONAA  --  >60    LIVER FUNCTION TESTS: Recent Labs    09/14/24 0825 10/01/24 0840  BILITOT 1.1 1.3*  AST 62* 112*  ALT 21 30  ALKPHOS 245* 374*  PROT 7.3 7.1  ALBUMIN 3.5 3.3*    TUMOR MARKERS: No results for input(s): AFPTM, CEA, CA199, CHROMGRNA in the last 8760 hours.  Assessment and Plan: 78 y.o. female with past medical history significant for colon polyp, anxiety, depression, diverticulosis, hyperlipidemia, hypertension, hypothyroidism, IBS, who was recently admitted to [facility] with persistent abdominal pain, weakness, weight loss, SVT,diminished appetite and imaging findings of liver lesions along with asymmetric mural thickening of colon .   Patient has no known history of cancer but has a sister who died of colon cancer.  CA125, CA 19-9, AFP and C CEA are pending.  Request now received from admitting team for image guided liver lesion biopsy for further evaluation.  Imaging studies have been reviewed by Dr.Henn.Risks and benefits of procedure was discussed with the patient/spouse including, but not limited to bleeding, infection, damage to adjacent structures or low yield requiring additional tests.  All of the questions were answered and there is agreement to proceed.  Consent signed and in chart.  Procedure tentatively scheduled for 12/22. HOLD ANY PLANNED ANTICOAGULATION UNTIL AFTER LIVER BIOPSY  Thank you for allowing our service to participate in MAELYN BERREY 's care.  Electronically Signed: D. Franky Rakers, PA-C   10/01/2024, 2:38 PM      I spent a total of  40 minutes   in face to face in clinical consultation, greater than 50% of which was counseling/coordinating care for image guided liver lesion biopsy   "

## 2024-10-01 NOTE — Progress Notes (Signed)
" °   10/01/24 1509  TOC Brief Assessment  Insurance and Status Reviewed  Patient has primary care physician Yes Yancy, Almarie LABOR, MD)  Home environment has been reviewed Home with spouse  Prior level of function: Independent  Prior/Current Home Services No current home services  Social Drivers of Health Review SDOH reviewed no interventions necessary  Readmission risk has been reviewed Yes  Transition of care needs no transition of care needs at this time    "

## 2024-10-01 NOTE — ED Triage Notes (Addendum)
 Patient woke up this morning with centralized abdominal pain that moves across her stomach. Stated she lost 10 pounds in the last 7 weeks due to no appetite. No vomiting or diarrhea. No chest or back pain. Feels extremely weak due to low iron  levels. Missed 2 infusions.

## 2024-10-01 NOTE — ED Provider Notes (Signed)
 " [place] EM[facility] DEPARTMENT AT [facility] Provider Note   CSN: [id number] Arrival date & time: 10/01/24  9195     Patient presents with: Abdominal Pain   Brandi Ferguson is a 78 y.o. female.   78 year old female presents with abdominal pain that started in her epigastric and then moved across her left and right side began today.  No fever or vomiting.  Also has endorsed weakness for several days due to known iron  deficiency anemia.  Has been treated for this recently and unfortunately has missed her last 2 iron  transfusions.  Denies any black stools.  No urinary symptoms.       Prior to Admission medications  Medication Sig Start Date End Date Taking? Authorizing Provider  ALPRAZolam  (XANAX ) 0.5 MG tablet Take 1 tablet (0.5 mg total) by mouth 3 (three) times daily as needed. 04/21/24   Rollene Almarie LABOR, MD  hydrochlorothiazide  (HYDRODIURIL ) 25 MG tablet Take 0.5 tablets (12.5 mg total) by mouth daily. 09/08/23   Rollene Almarie LABOR, MD  levothyroxine  (SYNTHROID ) 88 MCG tablet Take 1 tablet (88 mcg total) by mouth daily before breakfast. 06/03/24   Rollene Almarie LABOR, MD  lovastatin  (MEVACOR ) 40 MG tablet TAKE 1 TABLET BY MOUTH EVERYDAY AT BEDTIME 03/17/24   Rollene Almarie LABOR, MD  pantoprazole  (PROTONIX ) 40 MG tablet Take 1 tablet (40 mg total) by mouth daily. 09/14/24   Rollene Almarie LABOR, MD  polyethylene glycol powder (GLYCOLAX /MIRALAX ) 17 GM/SCOOP powder Take 17 g by mouth 2 (two) times daily as needed for moderate constipation. 04/21/24   Rollene Almarie LABOR, MD  traZODone  (DESYREL ) 50 MG tablet Take 1 tablet (50 mg total) by mouth at bedtime. 09/14/24   Rollene Almarie LABOR, MD    Allergies: Patient has no known allergies.    Review of Systems  All other systems reviewed and are negative.   Updated Vital Signs BP 133/78   Pulse (!) 150   Temp 97.7 F (36.5 C) (Oral)   Resp (!) 22   Ht 1.575 m (5' 2)   Wt 55.8 kg   SpO2 100%   BMI 22.50  kg/m   Physical Exam Vitals and nursing note reviewed.  Constitutional:      General: She is not in acute distress.    Appearance: Normal appearance. She is well-developed. She is not toxic-appearing.  HENT:     Head: Normocephalic and atraumatic.  Eyes:     General: Lids are normal.     Conjunctiva/sclera: Conjunctivae normal.     Pupils: Pupils are equal, round, and reactive to light.  Neck:     Thyroid : No thyroid  mass.     Trachea: No tracheal deviation.  Cardiovascular:     Rate and Rhythm: Regular rhythm. Tachycardia present.     Heart sounds: Normal heart sounds. No murmur heard.    No gallop.  Pulmonary:     Effort: Pulmonary effort is normal. No respiratory distress.     Breath sounds: Normal breath sounds. No stridor. No decreased breath sounds, wheezing, rhonchi or rales.  Abdominal:     General: There is no distension.     Palpations: Abdomen is soft.     Tenderness: There is abdominal tenderness in the right upper quadrant, epigastric area and left upper quadrant. There is no rebound.  Musculoskeletal:        General: No tenderness. Normal range of motion.     Cervical back: Normal range of motion and neck supple.  Skin:  General: Skin is warm and dry.     Findings: No abrasion or rash.  Neurological:     Mental Status: She is alert and oriented to person, place, and time. Mental status is at baseline.     GCS: GCS eye subscore is 4. GCS verbal subscore is 5. GCS motor subscore is 6.     Cranial Nerves: No cranial nerve deficit.     Sensory: No sensory deficit.     Motor: Motor function is intact.  Psychiatric:        Attention and Perception: Attention normal.        Speech: Speech normal.        Behavior: Behavior normal.     (all labs ordered are listed, but only abnormal results are displayed) Labs Reviewed  LIPASE, BLOOD  COMPREHENSIVE METABOLIC PANEL WITH GFR  CBC  URINALYSIS, ROUTINE W REFLEX MICROSCOPIC  TYPE AND SCREEN    EKG: EKG  Interpretation Date/Time:  Friday October 01 2024 08:18:09 EST Ventricular Rate:  151 PR Interval:  161 QRS Duration:  99 QT Interval:  318 QTC Calculation: 504 R Axis:   80  Text Interpretation: Supraventricular tachycardia Low voltage, precordial leads Repolarization abnormality, prob rate related Confirmed by Dasie Faden ([id number]) on 10/01/2024 8:22:13 AM  Radiology: No results found.   Procedures   Medications Ordered in the ED  morphine  (PF) 4 MG/ML injection 4 mg (has no administration in time range)                                    Medical Decision Making Amount and/or Complexity of Data Reviewed Labs: ordered. Radiology: ordered.  Risk Prescription drug management.   Patient is EKG shows sinus tachycardia.  Does note some abdominal discomfort.  Patient noted to be profoundly tachycardic and given IV fluids.  White count elevated 18.6 thousand.  Also has hypokalemia which was treated with oral as well as IV potassium.  Has elevation of her transaminases and alk phos.  Abdominal CT concerning for possible metastatic disease and possible intra-abdominal infection.  Patient was started on empiric antibiotics.  Will require admission and will consult hospitalist team     Final diagnoses:  None    ED Discharge Orders     None          Dasie Faden, MD 10/01/24 1157  "

## 2024-10-01 NOTE — Plan of Care (Signed)

## 2024-10-01 NOTE — H&P (Addendum)
 " History and Physical  NAHOMI Ferguson FMW:[id number] DOB: 1945-11-04 DOA: 10/01/2024  PCP: Rollene Almarie LABOR, MD   Chief Complaint: Abdominal pain  HPI: Brandi Ferguson is a 78 y.o. female with medical history significant for anxiety, hypertension, hyperlipidemia, hypothyroidism who is being admitted to the hospital with abdominal pain found to have new liver lesions and complicated by SVT.  She denies any personal history of cancer, she has a sister who died of colon cancer and so she has been diligent about having appropriate cancer screening including colonoscopies and mammography.  These have all been unremarkable, per the patient.  Starting about 5 or 6 weeks ago, per the patient as well as her husband at the bedside, she has had complete lack of appetite, weight loss of about 10 pounds and intermittent abdominal pain.  Early this morning about 4 AM she had sudden onset of central abdominal pain radiating to her bilateral flanks.  She came to the ER for evaluation, where unfortunately CT scan shows evidence of multiple liver lesions concerning for metastatic disease of unclear primary.  Review of Systems: Please see HPI for pertinent positives and negatives. A complete 10 system review of systems are otherwise negative.  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   Anxiety 1967   Have had anxiety started in high school   ANXIETY DEPRESSION 02/06/2008   Cataract Surgery 04/2022   Cough 09/27/2008   DIVERTICULOSIS OF COLON 02/06/2008   HYPERLIPIDEMIA 06/27/2008   HYPERTENSION 02/06/2008   HYPOTHYROIDISM 02/06/2008   Irritable bowel syndrome 02/06/2008   Lump or mass in breast 02/06/2008   Overweight(278.02) 06/27/2008   SINUSITIS- ACUTE-NOS 01/04/2010   URI 09/04/2009   Past Surgical History:  Procedure Laterality Date   ABDOMINAL HYSTERECTOMY  May 2011   Total hysterectomy   BREAST EXCISIONAL BIOPSY Right 1984   BREAST SURGERY  1984    Right lumpectomy- Benign   CERVICAL CONE BIOPSY  1977   EYE SURGERY  Biopsy lt eye 2020   Cataract surgery on both eyes 04/2022   LAPAROSCOPY  1972   Fertility work-up   SKIN LESION EXCISION  2006   Left breast   TONSILLECTOMY     Social History:  reports that she has never smoked. She has never used smokeless tobacco. She reports that she does not drink alcohol  and does not use drugs.  Allergies[1]  Family History  Problem Relation Age of Onset   Alzheimer's disease Mother    Alcohol  abuse Father    Diabetes Father    Hyperlipidemia Father    Hypertension Father    Coronary artery disease Father    Cancer Sister      Prior to Admission medications  Medication Sig Start Date End Date Taking? Authorizing Provider  ALPRAZolam  (XANAX ) 0.5 MG tablet Take 1 tablet (0.5 mg total) by mouth 3 (three) times daily as needed. 04/21/24   Rollene Almarie LABOR, MD  hydrochlorothiazide  (HYDRODIURIL ) 25 MG tablet Take 0.5 tablets (12.5 mg total) by mouth daily. 09/08/23   Rollene Almarie LABOR, MD  levothyroxine  (SYNTHROID ) 88 MCG tablet Take 1 tablet (88 mcg total) by mouth daily before breakfast. 06/03/24   Rollene Almarie LABOR, MD  lovastatin  (MEVACOR ) 40 MG tablet TAKE 1 TABLET BY MOUTH EVERYDAY AT BEDTIME 03/17/24   Rollene Almarie LABOR, MD  pantoprazole  (PROTONIX ) 40 MG tablet Take 1 tablet (40 mg total) by mouth daily. 09/14/24   Rollene Almarie LABOR, MD  polyethylene glycol powder (GLYCOLAX /MIRALAX )  17 GM/SCOOP powder Take 17 g by mouth 2 (two) times daily as needed for moderate constipation. 04/21/24   Rollene Almarie LABOR, MD  traZODone  (DESYREL ) 50 MG tablet Take 1 tablet (50 mg total) by mouth at bedtime. 09/14/24   Rollene Almarie LABOR, MD    Physical Exam: BP 135/72   Pulse (!) 144   Temp 97.7 F (36.5 C) (Oral)   Resp (!) 27   Ht 5' 2 (1.575 m)   Wt 55.8 kg   SpO2 99%   BMI 22.50 kg/m  General:  Alert, oriented, anxious, in no acute distress, husband at the bedside, she is  comfortable on room air. Eyes: EOMI, clear conjuctivae, white sclerea Neck: supple, no masses, trachea mildline  Cardiovascular: Tachycardic and regular, no murmurs or rubs, no peripheral edema  Respiratory: clear to auscultation bilaterally, no wheezes, no crackles  Abdomen: soft, diffusely tender with voluntary guarding, nondistended, normal bowel tones heard  Skin: dry, no rashes  Musculoskeletal: no joint effusions, normal range of motion  Psychiatric: appropriate affect, normal speech  Neurologic: extraocular muscles intact, clear speech, moving all extremities with intact sensorium         Labs on Admission:  Basic Metabolic Panel: Recent Labs  Lab 10/01/24 0840  NA 140  K 2.8*  CL 104  CO2 20*  GLUCOSE 104*  BUN 12  CREATININE 0.89  CALCIUM 9.2   Liver Function Tests: Recent Labs  Lab 10/01/24 0840  AST 112*  ALT 30  ALKPHOS 374*  BILITOT 1.3*  PROT 7.1  ALBUMIN 3.3*   Recent Labs  Lab 10/01/24 0840  LIPASE 25   No results for input(s): AMMONIA in the last 168 hours. CBC: Recent Labs  Lab 10/01/24 0840  WBC 18.6*  HGB 10.1*  HCT 32.1*  MCV 72.0*  PLT 631*   Cardiac Enzymes: No results for input(s): CKTOTAL, CKMB, CKMBINDEX, TROPONINI in the last 168 hours. BNP (last 3 results) No results for input(s): BNP in the last 8760 hours.  ProBNP (last 3 results) No results for input(s): PROBNP in the last 8760 hours.  CBG: No results for input(s): GLUCAP in the last 168 hours.  Radiological Exams on Admission: CT ABDOMEN PELVIS W CONTRAST Result Date: 10/01/2024 CLINICAL DATA:  Centralized abdominal pain that moves across her stomach. Patient stated she has lost 10 pounds in the last 7 weeks and feels extremely weak. EXAM: CT ABDOMEN AND PELVIS WITH CONTRAST TECHNIQUE: Multidetector CT imaging of the abdomen and pelvis was performed using the standard protocol following bolus administration of intravenous contrast. RADIATION DOSE  REDUCTION: This exam was performed according to the departmental dose-optimization program which includes automated exposure control, adjustment of the mA and/or kV according to patient size and/or use of iterative reconstruction technique. CONTRAST:  90mL OMNIPAQUE  IOHEXOL  300 MG/ML  SOLN COMPARISON:  None Available. FINDINGS: Lower chest: No acute abnormality. Hepatobiliary: Numerous heterogeneous low-attenuation liver mass is are seen. A small layer of gallstones versus gallbladder sludge is seen within the dependent portion of the gallbladder lumen. There is no evidence of biliary dilatation. Pancreas: Unremarkable. No pancreatic ductal dilatation or surrounding inflammatory changes. Spleen: Normal in size without focal abnormality. Adrenals/Urinary Tract: Adrenal glands are unremarkable. The left kidney is not clearly identified and appears to be markedly atrophic. There is mild compensatory hypertrophy of the right kidney with a subcentimeter simple renal cyst seen within the anterior aspect of the upper pole. No renal calculi or hydronephrosis is identified. The urinary bladder is markedly distended  and is otherwise unremarkable. Stomach/Bowel: There is a small hiatal hernia. The appendix is not clearly identified stool is seen throughout the large bowel. No evidence of bowel dilatation. Numerous diverticula are seen throughout the large bowel and distal duodenum. A small amount of Peri cecal fluid is seen with a small amount of peri-intestinal fluid noted within the mid to lower right abdomen. A mild amount of inflammatory fat stranding is seen along the mid to lower paracolic gutter on the right. Vascular/Lymphatic: Aortic atherosclerosis. No enlarged abdominal or pelvic lymph nodes. Reproductive: Status post hysterectomy. No adnexal masses. Other: No abdominal wall hernia or abnormality. No abdominopelvic ascites. Musculoskeletal: Multilevel degenerative changes are seen throughout the lumbar spine.  IMPRESSION: 1. Numerous heterogeneous low-attenuation liver masses, consistent with metastatic disease. 2. Small amount of peri-intestinal fluid within the mid to lower right abdomen with mild right paracolic gutter inflammatory fat stranding which may represent the presence of an underlying inflammatory or infectious process. 3. Small hiatal hernia. 4. Cholelithiasis versus gallbladder sludge. 5. Markedly distended urinary bladder. 6. Colonic and distal duodenal diverticulosis. 7. Markedly atrophic left kidney. 8. Aortic atherosclerosis. Electronically Signed   By: Suzen Dials M.D.   On: 10/01/2024 11:33   Assessment/Plan LAURIANA DENES is a 78 y.o. female with medical history significant for anxiety, hypertension, hyperlipidemia, hypothyroidism who is being admitted to the hospital with abdominal pain found to have new liver lesions and complicated by SVT.  Numerous liver masses-concerning for metastatic disease, with unknown primary.  Would explain her abdominal pain, anorexia and weight loss. -Observation admission -Pain control -Stat CT chest -IR consult for CT-guided liver biopsy, barring no advantageous target found on CT chest -Discussed with oncology Dr. Onesimo, who will arrange outpatient follow-up -Check AFP, CA 19-9, CA125, CEA with morning labs  SVT-unclear etiology, patient with no prior history.  Possibly tachycardic due to anxiety as well, possibly due to sepsis though I have low suspicion for this. -IV metoprolol  as needed  SIRS criteria-meeting with tachycardia and leukocytosis, though at this time I have low suspicion for acute bacterial infection as the patient has no fever, diarrhea.  CT findings could easily be from underlying inflammatory process.  She received a dose of IV Zosyn  in the emergency department. -Check lactic acid -Will empirically continue Zosyn  for the time being, though suspicion for bacterial infection is low  Abnormal LFTs-presumably due to her  liver lesions-Will trend with daily labs  Hypothyroidism-update TSH in the setting of her tachycardia, continue Synthroid   Anxiety-Xanax  3 times daily as needed  DVT prophylaxis: Lovenox     Code Status: Full Code  Consults called: None  Admission status: Observation  Time spent: 48 minutes  Wood Novacek CHRISTELLA Gail MD [facility] Pager [phone]  If 7PM-7AM, please contact night-coverage [email or web address] Password [email or web address]  10/01/2024, 12:58 PM      [1] No Known Allergies  "

## 2024-10-01 NOTE — ED Notes (Signed)
 Patient transported to CT

## 2024-10-01 NOTE — Progress Notes (Signed)
 Patient admitted with abdominal pain, hypokalemia and anemia, reports loss of appetite x 5-6 weeks, as well as losing 10 lbs, NSR on tele, skin intact, fluids infusing as ordered, oriented to the unit and call light, in bed resting, call light in reach

## 2024-10-02 DIAGNOSIS — I471 Supraventricular tachycardia, unspecified: Secondary | ICD-10-CM | POA: Insufficient documentation

## 2024-10-02 DIAGNOSIS — R6510 Systemic inflammatory response syndrome (SIRS) of non-infectious origin without acute organ dysfunction: Secondary | ICD-10-CM | POA: Diagnosis present

## 2024-10-02 DIAGNOSIS — N179 Acute kidney failure, unspecified: Secondary | ICD-10-CM | POA: Insufficient documentation

## 2024-10-02 DIAGNOSIS — F419 Anxiety disorder, unspecified: Secondary | ICD-10-CM | POA: Insufficient documentation

## 2024-10-02 LAB — COMPREHENSIVE METABOLIC PANEL WITH GFR
ALT: 19 U/L (ref 0–44)
AST: 61 U/L — ABNORMAL HIGH (ref 15–41)
Albumin: 2.5 g/dL — ABNORMAL LOW (ref 3.5–5.0)
Alkaline Phosphatase: 220 U/L — ABNORMAL HIGH (ref 38–126)
Anion gap: 11 (ref 5–15)
BUN: 12 mg/dL (ref 8–23)
CO2: 23 mmol/L (ref 22–32)
Calcium: 8.5 mg/dL — ABNORMAL LOW (ref 8.9–10.3)
Chloride: 104 mmol/L (ref 98–111)
Creatinine, Ser: 1.12 mg/dL — ABNORMAL HIGH (ref 0.44–1.00)
GFR, Estimated: 51 mL/min — ABNORMAL LOW
Glucose, Bld: 100 mg/dL — ABNORMAL HIGH (ref 70–99)
Potassium: 3.6 mmol/L (ref 3.5–5.1)
Sodium: 138 mmol/L (ref 135–145)
Total Bilirubin: 1.4 mg/dL — ABNORMAL HIGH (ref 0.0–1.2)
Total Protein: 5.6 g/dL — ABNORMAL LOW (ref 6.5–8.1)

## 2024-10-02 LAB — CBC
HCT: 27.6 % — ABNORMAL LOW (ref 36.0–46.0)
Hemoglobin: 8.6 g/dL — ABNORMAL LOW (ref 12.0–15.0)
MCH: 23 pg — ABNORMAL LOW (ref 26.0–34.0)
MCHC: 31.2 g/dL (ref 30.0–36.0)
MCV: 73.8 fL — ABNORMAL LOW (ref 80.0–100.0)
Platelets: 509 K/uL — ABNORMAL HIGH (ref 150–400)
RBC: 3.74 MIL/uL — ABNORMAL LOW (ref 3.87–5.11)
RDW: 23.9 % — ABNORMAL HIGH (ref 11.5–15.5)
WBC: 18.9 K/uL — ABNORMAL HIGH (ref 4.0–10.5)
nRBC: 0 % (ref 0.0–0.2)

## 2024-10-02 MED ORDER — METOPROLOL TARTRATE 5 MG/5ML IV SOLN
5.0000 mg | Freq: Three times a day (TID) | INTRAVENOUS | Status: DC | PRN
  Administered 2024-10-02 – 2024-10-05 (×5): 5 mg via INTRAVENOUS
  Filled 2024-10-02 (×7): qty 5

## 2024-10-02 NOTE — Assessment & Plan Note (Addendum)
 Normal TSH.  Continue Synthroid ?

## 2024-10-02 NOTE — Assessment & Plan Note (Addendum)
 Continue prn Xanax , trazodone  qhs

## 2024-10-02 NOTE — Assessment & Plan Note (Addendum)
 Tachycardia and leukocytosis on presentation Low suspicion for acute bacterial infection as the patient has no fever, diarrhea CT findings could easily be from underlying inflammatory process She received a dose of IV Zosyn  in the emergency department. No obvious need for ongoing antibiotics at this time

## 2024-10-02 NOTE — Progress Notes (Signed)
 Pt HR increased to 140- sustained at rest. On call provider notified, Lopressor  ordered. Pt denies any chest pain, palpitations, or generalized pain. Pt informed and educated on new medication, verbalized understanding.     10/02/24 0410  Assess: MEWS Score  Temp 99.1 F (37.3 C)  BP 121/64  MAP (mmHg) 81  Pulse Rate (!) 140  Level of Consciousness Alert  SpO2 97 %  O2 Device Room Air  Assess: MEWS Score  MEWS Temp 0  MEWS Systolic 0  MEWS Pulse 3  MEWS RR 0  MEWS LOC 0  MEWS Score 3  MEWS Score Color Yellow  Assess: if the MEWS score is Yellow or Red  Were vital signs accurate and taken at a resting state? Yes  MEWS guidelines implemented  Yes, yellow  Treat  MEWS Interventions Considered administering scheduled or prn medications/treatments as ordered  Take Vital Signs  Increase Vital Sign Frequency  Yellow: Q2hr x1, continue Q4hrs until patient remains green for 12hrs  Escalate  MEWS: Escalate Yellow: Discuss with charge nurse and consider notifying provider and/or RRT  Notify: Charge Nurse/RN  Name of Charge Nurse/RN Notified self  Provider Notification  Provider Name/Title Lavanda Horns NP  Date Provider Notified 10/02/24  Time Provider Notified [phone]  Method of Notification Page (secured chat)  Notification Reason Other (Comment) (yellow MEWS)  Provider response See new orders  Date of Provider Response 10/02/24  Assess: SIRS CRITERIA  SIRS Temperature  0  SIRS Respirations  0  SIRS Pulse 1  SIRS WBC 0  SIRS Score Sum  1

## 2024-10-02 NOTE — Care Management Obs Status (Signed)
 MEDICARE OBSERVATION STATUS NOTIFICATION   Patient Details  Name: LASHANTA ELBE MRN: [id number] Date of Birth: 1945-11-10   Medicare Observation Status Notification Given:  Yes    Anahid Eskelson LILLETTE Fenton, LCSW 10/02/2024, 3:35 PM

## 2024-10-02 NOTE — Plan of Care (Signed)

## 2024-10-02 NOTE — Assessment & Plan Note (Signed)
 -  Hold HCTZ

## 2024-10-02 NOTE — Progress Notes (Signed)
 Went to administer Lopressor  for HR of 139, patient's HR is now 99 sustained. Will return medication.

## 2024-10-02 NOTE — Assessment & Plan Note (Addendum)
 Unclear etiology, patient with no prior history Possibly tachycardic due to anxiety and dehydrtion Low suspicion for sepsis IV metoprolol  as needed

## 2024-10-02 NOTE — Assessment & Plan Note (Addendum)
 Presented with abdominal pain, anorexia, and weight loss CT with numerous liver masses-concerning for metastatic disease, with unknown primary CT addendum with possible colon mass She has a very strong FH of colon CA Observation for now Pain control IR consult for CT-guided liver biopsy on 12/22 Discussed with oncology Dr. Onesimo, who will arrange outpatient follow-up Check AFP, CA 19-9, CA125, CEA with morning labs Abnormal LFTs-presumably due to her liver lesions-Will trend with daily labs

## 2024-10-02 NOTE — Progress Notes (Signed)
 " Progress Note   Patient: Brandi Ferguson FMW:[id number] DOB: 1946/01/31 DOA: 10/01/2024     0 DOS: the patient was seen and examined on 10/02/2024   Brief hospital course: 78yo with h/o HTN, anxiety, HLD, and hypothyroidism who presented on 12/19 with abdominal pain  Imaging showed new liver lesions concerning for metastatic disease, possible colon mass.  CT chest without metastatic disease but did show pulmonary nodules.  She is planned for an US -guided liver lesion biopsy. She also developed SVT which was treated with prn IV metoprolol .  Oncology will arrange for outpatient f/u.    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 Onesimo, who will arrange outpatient follow-up Check AFP, CA 19-9, CA125, CEA with morning labs Abnormal LFTs-presumably due to her liver lesions-Will trend with daily labs  SIRS (systemic inflammatory response syndrome) (HCC) Tachycardia and leukocytosis on presentation Low suspicion for acute bacterial infection as the patient has no fever, diarrhea CT findings could easily be from underlying inflammatory process She received a dose of IV Zosyn  in the emergency department. No obvious need for ongoing antibiotics at this time AKI (acute kidney injury) Normal baseline creatinine, up to 1.2 this AM Suspect mild volume depletion in the setting of SVT Given IVF SVT (supraventricular tachycardia) Unclear etiology, patient with no prior history Possibly tachycardic due to anxiety and dehydrtion Low suspicion for sepsis IV metoprolol  as needed Hypothyroidism Normal TSH Continue Synthroid   Hyperlipidemia Hold lovastatin  in the  setting of elevated LFTs Essential hypertension Hold HCTZ Anxiety Continue prn Xanax , trazodone  qhs      Consultants: IR Oncology - telephone only  Procedures: US -guided liver biopsy  Antibiotics: Zosyn  12/19-20      Subjective: No specific complaints.  Reports last C-scope was in 2021.  Has [facility], thinking about changing to another group.  Understands plan for biopsy Monday.   Objective: Vitals:   10/02/24 0441 10/02/24 0757  BP: 133/62 (!) 113/56  Pulse: 98   Resp:    Temp: 99 F (37.2 C)   SpO2: 99%     Intake/Output Summary (Last 24 hours) at 10/02/2024 0833 Last data filed at 10/02/2024 0351 Gross per 24 hour  Intake 3117.44 ml  Output --  Net 3117.44 ml   Filed Weights   10/01/24 0813  Weight: 55.8 kg    Exam:  General:  Appears calm and comfortable and is in NAD Eyes:  normal lids, iris ENT:  grossly normal hearing, lips & tongue, mmm Cardiovascular:  RRR. No LE edema.  Respiratory:   CTA bilaterally with no wheezes/rales/rhonchi.  Normal respiratory effort. Abdomen:  soft, NT, ND Skin:  no rash or induration seen on limited exam Musculoskeletal:  grossly normal tone BUE/BLE, good ROM, no bony abnormality Psychiatric:  grossly normal mood and affect, speech fluent and appropriate, AOx3 Neurologic:  CN 2-12 grossly intact, moves all extremities in coordinated fashion  Data Reviewed: I have reviewed the patient's lab results since admission.  Pertinent labs for today include:   BUN 12/Creatinine 1.12/GFR 51 Lactate 2.7, 2.5 WBC 18.9 Hgb 8.6 Platelets 509     Family Communication: None present     Code Status: Full Code   Disposition: Status is:  Observation The patient remains OBS appropriate and will d/c before 2 midnights.     Time spent: 50 minutes  Unresulted Labs (From admission, onward)     Start     Ordered   10/04/24 0500  CBC with Differential/Platelet  Tomorrow morning,   R       Question:   Specimen collection method  Answer:  Lab=Lab collect   10/01/24 1457   10/04/24 0500  Protime-INR  Once,   R       Question:  Specimen collection method  Answer:  Lab=Lab collect   10/01/24 1457   10/04/24 0500  Comprehensive metabolic panel  Once,   R       Question:  Specimen collection method  Answer:  Lab=Lab collect   10/01/24 1457   10/02/24 0500  CEA  Tomorrow morning,   R        10/01/24 1400   10/02/24 0500  Cancer antigen 19-9  Tomorrow morning,   R        10/01/24 1400   10/02/24 0500  AFP tumor marker  Tomorrow morning,   R        10/01/24 1400   10/02/24 0500  CA 125  Tomorrow morning,   R        10/01/24 1400             Author: Delon Herald, MD 10/02/2024 8:33 AM  For on call review [email or web address].            "

## 2024-10-02 NOTE — Assessment & Plan Note (Signed)
 Hold lovastatin  in the setting of elevated LFTs

## 2024-10-02 NOTE — Assessment & Plan Note (Addendum)
 Normal baseline creatinine, up to 1.2 this AM Suspect mild volume depletion in the setting of SVT Given IVF

## 2024-10-02 NOTE — Plan of Care (Signed)

## 2024-10-02 NOTE — Hospital Course (Signed)
 78yo with h/o HTN, anxiety, HLD, and hypothyroidism who presented on 12/19 with abdominal pain  Imaging showed new liver lesions concerning for metastatic disease, possible colon mass.  CT chest without metastatic disease but did show pulmonary nodules.  She is planned for an US -guided liver lesion biopsy. She also developed SVT which was treated with prn IV metoprolol .  Oncology will arrange for outpatient f/u.

## 2024-10-03 DIAGNOSIS — E441 Mild protein-calorie malnutrition: Secondary | ICD-10-CM | POA: Diagnosis present

## 2024-10-03 DIAGNOSIS — C787 Secondary malignant neoplasm of liver and intrahepatic bile duct: Secondary | ICD-10-CM

## 2024-10-03 DIAGNOSIS — I468 Cardiac arrest due to other underlying condition: Secondary | ICD-10-CM | POA: Diagnosis not present

## 2024-10-03 DIAGNOSIS — D509 Iron deficiency anemia, unspecified: Secondary | ICD-10-CM | POA: Diagnosis present

## 2024-10-03 DIAGNOSIS — I10 Essential (primary) hypertension: Secondary | ICD-10-CM | POA: Diagnosis present

## 2024-10-03 DIAGNOSIS — R0603 Acute respiratory distress: Secondary | ICD-10-CM | POA: Diagnosis not present

## 2024-10-03 DIAGNOSIS — E039 Hypothyroidism, unspecified: Secondary | ICD-10-CM | POA: Diagnosis present

## 2024-10-03 DIAGNOSIS — N179 Acute kidney failure, unspecified: Secondary | ICD-10-CM | POA: Diagnosis present

## 2024-10-03 DIAGNOSIS — C189 Malignant neoplasm of colon, unspecified: Secondary | ICD-10-CM | POA: Diagnosis present

## 2024-10-03 DIAGNOSIS — I471 Supraventricular tachycardia, unspecified: Secondary | ICD-10-CM | POA: Diagnosis present

## 2024-10-03 DIAGNOSIS — R6510 Systemic inflammatory response syndrome (SIRS) of non-infectious origin without acute organ dysfunction: Secondary | ICD-10-CM | POA: Diagnosis present

## 2024-10-03 DIAGNOSIS — K922 Gastrointestinal hemorrhage, unspecified: Secondary | ICD-10-CM | POA: Diagnosis not present

## 2024-10-03 DIAGNOSIS — Z515 Encounter for palliative care: Secondary | ICD-10-CM | POA: Diagnosis not present

## 2024-10-03 DIAGNOSIS — G9341 Metabolic encephalopathy: Secondary | ICD-10-CM | POA: Diagnosis not present

## 2024-10-03 DIAGNOSIS — E876 Hypokalemia: Secondary | ICD-10-CM | POA: Diagnosis present

## 2024-10-03 DIAGNOSIS — Z66 Do not resuscitate: Secondary | ICD-10-CM | POA: Diagnosis not present

## 2024-10-03 DIAGNOSIS — E872 Acidosis, unspecified: Secondary | ICD-10-CM | POA: Diagnosis present

## 2024-10-03 DIAGNOSIS — E162 Hypoglycemia, unspecified: Secondary | ICD-10-CM | POA: Diagnosis not present

## 2024-10-03 DIAGNOSIS — F32A Depression, unspecified: Secondary | ICD-10-CM | POA: Diagnosis present

## 2024-10-03 DIAGNOSIS — R578 Other shock: Secondary | ICD-10-CM | POA: Diagnosis not present

## 2024-10-03 DIAGNOSIS — R634 Abnormal weight loss: Secondary | ICD-10-CM | POA: Diagnosis present

## 2024-10-03 DIAGNOSIS — C785 Secondary malignant neoplasm of large intestine and rectum: Secondary | ICD-10-CM | POA: Diagnosis not present

## 2024-10-03 DIAGNOSIS — E785 Hyperlipidemia, unspecified: Secondary | ICD-10-CM | POA: Diagnosis present

## 2024-10-03 DIAGNOSIS — R918 Other nonspecific abnormal finding of lung field: Secondary | ICD-10-CM | POA: Diagnosis present

## 2024-10-03 DIAGNOSIS — K580 Irritable bowel syndrome with diarrhea: Secondary | ICD-10-CM | POA: Diagnosis present

## 2024-10-03 DIAGNOSIS — I469 Cardiac arrest, cause unspecified: Secondary | ICD-10-CM | POA: Diagnosis not present

## 2024-10-03 LAB — AFP TUMOR MARKER: AFP, Serum, Tumor Marker: <1.8 ng/mL (ref 0.0–9.2)

## 2024-10-03 LAB — CANCER ANTIGEN 19-9: CA 19-9: 7509 U/mL — ABNORMAL HIGH (ref 0–35)

## 2024-10-03 LAB — CEA: CEA: 1937 ng/mL — ABNORMAL HIGH (ref 0.0–4.7)

## 2024-10-03 LAB — MAGNESIUM: Magnesium: 1.7 mg/dL (ref 1.7–2.4)

## 2024-10-03 LAB — CA 125: Cancer Antigen (CA) 125: 17.9 U/mL (ref 0.0–38.1)

## 2024-10-03 MED ORDER — METOPROLOL TARTRATE 25 MG PO TABS
25.0000 mg | ORAL_TABLET | Freq: Two times a day (BID) | ORAL | Status: DC
  Administered 2024-10-03 – 2024-10-05 (×5): 25 mg via ORAL
  Filled 2024-10-03 (×6): qty 1

## 2024-10-03 NOTE — Plan of Care (Signed)
  Problem: Education: Goal: Knowledge of General Education information will improve Description: Including pain rating scale, medication(s)/side effects and non-pharmacologic comfort measures Outcome: Progressing   Problem: Activity: Goal: Risk for activity intolerance will decrease Outcome: Progressing   Problem: Nutrition: Goal: Adequate nutrition will be maintained Outcome: Progressing   Problem: Elimination: Goal: Will not experience complications related to bowel motility Outcome: Progressing   Problem: Pain Managment: Goal: General experience of comfort will improve and/or be controlled Outcome: Progressing   Problem: Safety: Goal: Ability to remain free from injury will improve Outcome: Progressing

## 2024-10-03 NOTE — Progress Notes (Signed)
 " Progress Note   Patient: Brandi Ferguson FMW:[id number] DOB: 01-16-1946 DOA: 10/01/2024     0 DOS: the patient was seen and examined on 10/03/2024   Brief hospital course: 78yo with h/o HTN, anxiety, HLD, and hypothyroidism who presented on 12/19 with abdominal pain  Imaging showed new liver lesions concerning for metastatic disease, possible colon mass.  CT chest without metastatic disease but did show pulmonary nodules.  She is planned for an US -guided liver lesion biopsy. She also developed SVT which was treated with prn IV metoprolol .  Oncology will arrange for outpatient f/u.     Assessment & Plan Metastatic cancer to liver [facility]) Presented with abdominal pain, anorexia, and weight loss CT with numerous liver masses-concerning for metastatic disease, with unknown primary CT addendum with possible ascending colon mass as primary source She has a very strong FH of colon CA Observation for now Pain control IR consult for CT-guided liver biopsy on 12/22 Discussed with oncology Dr. Onesimo, who will arrange outpatient follow-up CA 19-9 7509, CEA 1937 AFP <1.8, CA 125 17.9 Abnormal LFTs-presumably due to her liver lesions SIRS (systemic inflammatory response syndrome) (HCC) Tachycardia and leukocytosis on presentation Low suspicion for acute bacterial infection as the patient has no fever, diarrhea CT findings could easily be from underlying inflammatory process She received a dose of IV Zosyn  in the emergency department. No obvious need for ongoing antibiotics at this time AKI (acute kidney injury) Normal baseline creatinine, up to 1.2 this AM Suspect mild volume depletion in the setting of SVT Given IVF SVT (supraventricular tachycardia) Unclear etiology, patient with no prior history Possibly related to anxiety and dehydration - but not improving with IVF so dehydration is less likely Low suspicion for sepsis Start metoprolol  IV metoprolol  as needed EKG with sinus  tachycardia and PVCs (not afib) Hypothyroidism Normal TSH Continue Synthroid   Hyperlipidemia Hold lovastatin  in the setting of elevated LFTs Essential hypertension Hold HCTZ Anxiety Continue prn Xanax , trazodone  qhs Generalized weakness Likely related to underlying condition PT/OT consulted Nutrition consult      Consultants: IR Oncology - telephone only GI - telephone only PT OT   Procedures: US -guided liver biopsy 12/22   Antibiotics: Zosyn  12/19-20        Subjective: Feeling ok - pain is improved.  Eager for biopsy tomorrow.   Objective: Vitals:   10/03/24 0231 10/03/24 0437  BP: (!) 153/77 122/71  Pulse: (!) 150 (!) 134  Resp:  16  Temp: 98.6 F (37 C) 97.7 F (36.5 C)  SpO2:  97%    Intake/Output Summary (Last 24 hours) at 10/03/2024 0816 Last data filed at 10/03/2024 0400 Gross per 24 hour  Intake 580 ml  Output 5 ml  Net 575 ml   Filed Weights   10/01/24 0813  Weight: 55.8 kg    Exam:  General:  Appears calm and comfortable and is in NAD, frail Eyes:  normal lids, iris ENT:  grossly normal hearing, lips & tongue, mmm Cardiovascular:  RRR. No LE edema.  Respiratory:   CTA bilaterally with no wheezes/rales/rhonchi.  Normal respiratory effort. Abdomen:  soft, diffusely tender along primarily upper areas Skin:  no rash or induration seen on limited exam Musculoskeletal:  grossly normal tone BUE/BLE, good ROM, no bony abnormality Psychiatric:  blunted mood and affect, speech fluent and appropriate, AOx3 Neurologic:  CN 2-12 grossly intact, moves all extremities in coordinated fashion  Data Reviewed: I have reviewed the patient's lab results since admission.  Pertinent labs for today include:  None today     Family Communication: None present; she declined to have me contact her husband  Mobility: PT/OT Consulted     Code Status: Full Code     Disposition: Status is: Observation The patient remains OBS appropriate and  will d/c before 2 midnights.     Time spent: 50 minutes  Unresulted Labs (From admission, onward)     Start     Ordered   10/04/24 0500  CBC with Differential/Platelet  Tomorrow morning,   R       Question:  Specimen collection method  Answer:  Lab=Lab collect   10/01/24 1457   10/04/24 0500  Protime-INR  Once,   R       Question:  Specimen collection method  Answer:  Lab=Lab collect   10/01/24 1457   10/04/24 0500  Comprehensive metabolic panel  Once,   R       Question:  Specimen collection method  Answer:  Lab=Lab collect   10/01/24 1457             Author: Delon Herald, MD 10/03/2024 8:16 AM  For on call review [email or web address].            "

## 2024-10-03 NOTE — Plan of Care (Signed)

## 2024-10-03 NOTE — Assessment & Plan Note (Addendum)
 Unclear etiology, patient with no prior history Possibly related to anxiety and dehydration - but not improving with IVF so dehydration is less likely Low suspicion for sepsis Start metoprolol  IV metoprolol  as needed EKG with sinus tachycardia and PVCs (not afib)

## 2024-10-03 NOTE — Assessment & Plan Note (Addendum)
 Normal baseline creatinine, up to 1.2 this AM Suspect mild volume depletion in the setting of SVT Given IVF

## 2024-10-03 NOTE — Assessment & Plan Note (Addendum)
 -  Hold HCTZ

## 2024-10-03 NOTE — Progress Notes (Signed)
" °   10/03/24 0231  Assess: MEWS Score  Temp 98.6 F (37 C)  BP (!) 153/77  MAP (mmHg) 91  Pulse Rate (!) 150  Level of Consciousness Alert  Assess: MEWS Score  MEWS Temp 0  MEWS Systolic 0  MEWS Pulse 3  MEWS RR 0  MEWS LOC 0  MEWS Score 3  MEWS Score Color Yellow  Notify: Charge Nurse/RN  Name of Charge Nurse/RN Notified self  Provider Notification  Provider Name/Title Tamsen Horns, NP  Date Provider Notified 10/03/24  Time Provider Notified [phone]  Method of Notification Page (secured chat)  Notification Reason Other (Comment) (Yellow MEWS)  Date of Provider Response 10/03/24  Assess: SIRS CRITERIA  SIRS Temperature  0  SIRS Respirations  0  SIRS Pulse 1  SIRS WBC 0  SIRS Score Sum  1   Pt's HR 150 and sustained. PRN IV Metoprolol  administered per order. HR currently 135. Hospitalist notified and stated to inform her if HR continues to be elevated. Pt has no complaints at this time. Yellow MEWS protocol continued.  "

## 2024-10-03 NOTE — Assessment & Plan Note (Addendum)
 Continue prn Xanax , trazodone  qhs

## 2024-10-03 NOTE — Assessment & Plan Note (Addendum)
 Hold lovastatin  in the setting of elevated LFTs

## 2024-10-03 NOTE — Assessment & Plan Note (Addendum)
 Normal TSH.  Continue Synthroid ?

## 2024-10-03 NOTE — Assessment & Plan Note (Addendum)
 Presented with abdominal pain, anorexia, and weight loss CT with numerous liver masses-concerning for metastatic disease, with unknown primary CT addendum with possible ascending colon mass as primary source She has a very strong FH of colon CA Observation for now Pain control IR consult for CT-guided liver biopsy on 12/22 Discussed with oncology Dr. Onesimo, who will arrange outpatient follow-up CA 19-9 7509, CEA 1937 AFP <1.8, CA 125 17.9 Abnormal LFTs-presumably due to her liver lesions

## 2024-10-03 NOTE — Assessment & Plan Note (Addendum)
 Tachycardia and leukocytosis on presentation Low suspicion for acute bacterial infection as the patient has no fever, diarrhea CT findings could easily be from underlying inflammatory process She received a dose of IV Zosyn  in the emergency department. No obvious need for ongoing antibiotics at this time

## 2024-10-03 NOTE — Assessment & Plan Note (Signed)
 Likely related to underlying condition PT/OT consulted Nutrition consult

## 2024-10-04 ENCOUNTER — Ambulatory Visit

## 2024-10-04 ENCOUNTER — Inpatient Hospital Stay (HOSPITAL_COMMUNITY)

## 2024-10-04 DIAGNOSIS — E43 Unspecified severe protein-calorie malnutrition: Secondary | ICD-10-CM | POA: Insufficient documentation

## 2024-10-04 DIAGNOSIS — G9341 Metabolic encephalopathy: Secondary | ICD-10-CM | POA: Insufficient documentation

## 2024-10-04 DIAGNOSIS — C787 Secondary malignant neoplasm of liver and intrahepatic bile duct: Secondary | ICD-10-CM | POA: Diagnosis not present

## 2024-10-04 LAB — CBC WITH DIFFERENTIAL/PLATELET
Abs Immature Granulocytes: 0.13 K/uL — ABNORMAL HIGH (ref 0.00–0.07)
Basophils Absolute: 0.1 K/uL (ref 0.0–0.1)
Basophils Relative: 0 %
Eosinophils Absolute: 0.1 K/uL (ref 0.0–0.5)
Eosinophils Relative: 0 %
HCT: 30.3 % — ABNORMAL LOW (ref 36.0–46.0)
Hemoglobin: 9.6 g/dL — ABNORMAL LOW (ref 12.0–15.0)
Immature Granulocytes: 1 %
Lymphocytes Relative: 5 %
Lymphs Abs: 1.3 K/uL (ref 0.7–4.0)
MCH: 23 pg — ABNORMAL LOW (ref 26.0–34.0)
MCHC: 31.7 g/dL (ref 30.0–36.0)
MCV: 72.5 fL — ABNORMAL LOW (ref 80.0–100.0)
Monocytes Absolute: 1.1 K/uL — ABNORMAL HIGH (ref 0.1–1.0)
Monocytes Relative: 5 %
Neutro Abs: 21.4 K/uL — ABNORMAL HIGH (ref 1.7–7.7)
Neutrophils Relative %: 89 %
Platelets: 523 K/uL — ABNORMAL HIGH (ref 150–400)
RBC: 4.18 MIL/uL (ref 3.87–5.11)
RDW: 25 % — ABNORMAL HIGH (ref 11.5–15.5)
Smear Review: NORMAL
WBC: 24 K/uL — ABNORMAL HIGH (ref 4.0–10.5)
nRBC: 0 % (ref 0.0–0.2)

## 2024-10-04 LAB — COMPREHENSIVE METABOLIC PANEL WITH GFR
ALT: 15 U/L (ref 0–44)
AST: 68 U/L — ABNORMAL HIGH (ref 15–41)
Albumin: 2.1 g/dL — ABNORMAL LOW (ref 3.5–5.0)
Alkaline Phosphatase: 285 U/L — ABNORMAL HIGH (ref 38–126)
Anion gap: 10 (ref 5–15)
BUN: 20 mg/dL (ref 8–23)
CO2: 23 mmol/L (ref 22–32)
Calcium: 9 mg/dL (ref 8.9–10.3)
Chloride: 103 mmol/L (ref 98–111)
Creatinine, Ser: 0.72 mg/dL (ref 0.44–1.00)
GFR, Estimated: >60 mL/min
Glucose, Bld: 84 mg/dL (ref 70–99)
Potassium: 3.7 mmol/L (ref 3.5–5.1)
Sodium: 136 mmol/L (ref 135–145)
Total Bilirubin: 1.4 mg/dL — ABNORMAL HIGH (ref 0.0–1.2)
Total Protein: 5.3 g/dL — ABNORMAL LOW (ref 6.5–8.1)

## 2024-10-04 LAB — PROTIME-INR
INR: 1.7 — ABNORMAL HIGH (ref 0.8–1.2)
Prothrombin Time: 20.4 s — ABNORMAL HIGH (ref 11.4–15.2)

## 2024-10-04 MED ORDER — MIDAZOLAM HCL (PF) 2 MG/2ML IJ SOLN
INTRAMUSCULAR | Status: AC | PRN
  Administered 2024-10-04: 1 mg via INTRAVENOUS

## 2024-10-04 MED ORDER — HYDROCODONE-ACETAMINOPHEN 5-325 MG PO TABS: 1.0000 | ORAL_TABLET | ORAL | Status: DC | PRN

## 2024-10-04 MED ORDER — NA SULFATE-K SULFATE-MG SULF 17.5-3.13-1.6 GM/177ML PO SOLN
0.5000 | Freq: Once | ORAL | Status: AC
  Administered 2024-10-04: 177 mL via ORAL
  Filled 2024-10-04: qty 1

## 2024-10-04 MED ORDER — FENTANYL CITRATE (PF) 100 MCG/2ML IJ SOLN
INTRAMUSCULAR | Status: AC
  Filled 2024-10-04: qty 2

## 2024-10-04 MED ORDER — SODIUM CHLORIDE 0.9 % IV SOLN: INTRAVENOUS | Status: AC

## 2024-10-04 MED ORDER — QUETIAPINE FUMARATE 25 MG PO TABS
25.0000 mg | ORAL_TABLET | Freq: Every day | ORAL | Status: DC
  Administered 2024-10-04: 25 mg via ORAL
  Filled 2024-10-04: qty 1

## 2024-10-04 MED ORDER — PEG 3350-KCL-NA BICARB-NACL 420 G PO SOLR
2000.0000 mL | Freq: Once | ORAL | Status: DC
  Filled 2024-10-04: qty 4000

## 2024-10-04 MED ORDER — NA SULFATE-K SULFATE-MG SULF 17.5-3.13-1.6 GM/177ML PO SOLN: 0.5000 | Freq: Once | ORAL | Status: DC

## 2024-10-04 MED ORDER — FENTANYL CITRATE (PF) 100 MCG/2ML IJ SOLN
INTRAMUSCULAR | Status: AC | PRN
  Administered 2024-10-04 (×2): 50 ug via INTRAVENOUS

## 2024-10-04 MED ORDER — SODIUM CHLORIDE 0.9 % IV SOLN: INTRAVENOUS | Status: DC

## 2024-10-04 MED ORDER — MIDAZOLAM HCL 2 MG/2ML IJ SOLN
INTRAMUSCULAR | Status: AC
  Filled 2024-10-04: qty 2

## 2024-10-04 MED ORDER — PEG 3350-KCL-NA BICARB-NACL 420 G PO SOLR
2000.0000 mL | Freq: Once | ORAL | Status: AC
  Administered 2024-10-04: 2000 mL via ORAL
  Filled 2024-10-04: qty 4000

## 2024-10-04 MED ORDER — BISACODYL 5 MG PO TBEC
10.0000 mg | DELAYED_RELEASE_TABLET | Freq: Once | ORAL | Status: AC
  Administered 2024-10-04: 10 mg via ORAL
  Filled 2024-10-04: qty 2

## 2024-10-04 MED ORDER — LIDOCAINE HCL 1 % IJ SOLN
INTRAMUSCULAR | Status: AC
  Filled 2024-10-04: qty 20

## 2024-10-04 NOTE — Assessment & Plan Note (Addendum)
 Hold lovastatin  in the setting of elevated LFTs

## 2024-10-04 NOTE — Assessment & Plan Note (Addendum)
 -  Hold HCTZ

## 2024-10-04 NOTE — Evaluation (Signed)
 Occupational Therapy Evaluation Patient Details Name: Brandi Ferguson MRN: [id number] DOB: 27-Aug-1946 Today's Date: 10/04/2024   History of Present Illness   28 female presents to therapy following hospital admission on 10/01/2024 due to abdominal pain and poor po intake. Pt found to have liver masses and concerning for metastatic dz. Pt also found to have possible mass in colon and lung nodules as well as SIRS, AKI and SVT. Pt PMH includes but is not limited to: HLD, anxiety, hypothyroidism,  diverticulosis, HTN, and  IBS.     Clinical Impressions Pt lethargic, husband present during session. Pt was able to state and spell her name, when asking other orientation questions Pt from then on only spelled out T-O-U-G-H, stating that was her last name, month, year, location, etc. Pt with no facial drooping, no one-sided weakness or neglect. Pt became mildly more alert and was able to state what month it was, not able to state location, follow instructions inconsistently, but she spoke in tangents and couldn't figure out how to get back into bed. RN and MD informed. Pt's husband states he thinks it was from the liver biopsy anesthesia she had earlier. Pt lives at home with husband who can assist as needed. PLOF independent, no AD. Pt currently mod A overall for all ADL's due to lethargy and confusion. Pt was able to don/doff socks after several attempts, was able to stand and take side steps, etc., but with max cueing and multiple attempts. Pt would benefit from continued acute OT to maximize functional strength and participation with ADLs, expecting to improve quickly, HHOT follow up recommended. DME to be determined upon level of function closer to DC.      If plan is discharge home, recommend the following:   A lot of help with walking and/or transfers;A lot of help with bathing/dressing/bathroom;Assistance with cooking/housework;Assist for transportation;Help with stairs or ramp for entrance      Functional Status Assessment   Patient has had a recent decline in their functional status and demonstrates the ability to make significant improvements in function in a reasonable and predictable amount of time.     Equipment Recommendations   Other (comment) (TBD)     Recommendations for Other Services         Precautions/Restrictions   Precautions Precautions: Fall Recall of Precautions/Restrictions: Impaired Restrictions Weight Bearing Restrictions Per Provider Order: No     Mobility Bed Mobility Overal bed mobility: Needs Assistance Bed Mobility: Supine to Sit, Sit to Supine     Supine to sit: Mod assist, HOB elevated Sit to supine: Mod assist, HOB elevated   General bed mobility comments: mod A in/out of bed    Transfers Overall transfer level: Needs assistance Equipment used: Rolling walker (2 wheels) Transfers: Sit to/from Stand Sit to Stand: Mod assist           General transfer comment: mod A, able to take side steps, poor activity tolerance      Balance Overall balance assessment: Needs assistance Sitting-balance support: No upper extremity supported, Feet supported Sitting balance-Leahy Scale: Fair     Standing balance support: Bilateral upper extremity supported, During functional activity Standing balance-Leahy Scale: Poor                             ADL either performed or assessed with clinical judgement   ADL Overall ADL's : Needs assistance/impaired Eating/Feeding: Set up;Cueing for sequencing   Grooming: Minimal assistance  Upper Body Bathing: Moderate assistance   Lower Body Bathing: Moderate assistance   Upper Body Dressing : Moderate assistance   Lower Body Dressing: Moderate assistance   Toilet Transfer: Moderate assistance;Rolling walker (2 wheels);BSC/3in1   Toileting- Clothing Manipulation and Hygiene: Moderate assistance;Sitting/lateral lean         General ADL Comments: Pt overall mod A  for dressing/bathing due to lethargy and decreased activity tolerance, fair overall ROM     Vision Baseline Vision/History: 1 Wears glasses Ability to See in Adequate Light: 0 Adequate Patient Visual Report: No change from baseline       Perception         Praxis         Pertinent Vitals/Pain       Extremity/Trunk Assessment Upper Extremity Assessment Upper Extremity Assessment: Generalized weakness   Lower Extremity Assessment Lower Extremity Assessment: Defer to PT evaluation       Communication Communication Communication: Impaired Factors Affecting Communication: Hearing impaired;Reduced clarity of speech   Cognition Arousal: Lethargic Behavior During Therapy: WFL for tasks assessed/performed Cognition: Cognition impaired   Orientation impairments: Place, Time, Situation         OT - Cognition Comments: Pt lethartgic, difficulty keeping eyes open, aphasic at one point, continued to repeat T-O-U-G-H for all her answers. Became mildly more alert during end of session, able to take a couple steps, but still confused.                 Following commands: Impaired Following commands impaired: Follows one step commands inconsistently, Follows one step commands with increased time     Cueing  General Comments   Cueing Techniques: Verbal cues;Gestural cues;Tactile cues;Visual cues      Exercises     Shoulder Instructions      Home Living Family/patient expects to be discharged to:: Private residence Living Arrangements: Spouse/significant other Available Help at Discharge: Family;Available 24 hours/day Type of Home: House Home Access: Level entry     Home Layout: One level     Bathroom Shower/Tub: Walk-in shower;Tub/shower unit         Home Equipment: Grab bars - tub/shower   Additional Comments: Pt lives with husband who is available as needed,      Prior Functioning/Environment Prior Level of Function : Independent/Modified  Independent             Mobility Comments: ind, no falls ADLs Comments: ind    OT Problem List: Decreased strength;Decreased range of motion;Decreased activity tolerance;Impaired balance (sitting and/or standing);Decreased cognition;Decreased safety awareness   OT Treatment/Interventions: Self-care/ADL training;Therapeutic exercise;Energy conservation;DME and/or AE instruction;Therapeutic activities;Patient/family education;Balance training      OT Goals(Current goals can be found in the care plan section)   Acute Rehab OT Goals Patient Stated Goal: unable to participate in setting goals OT Goal Formulation: With family Time For Goal Achievement: 10/18/24 Potential to Achieve Goals: Good   OT Frequency:  Min 2X/week    Co-evaluation              AM-PAC OT 6 Clicks Daily Activity     Outcome Measure Help from another person eating meals?: A Little Help from another person taking care of personal grooming?: A Little Help from another person toileting, which includes using toliet, bedpan, or urinal?: A Lot Help from another person bathing (including washing, rinsing, drying)?: A Lot Help from another person to put on and taking off regular upper body clothing?: A Lot Help from another person to put on and  taking off regular lower body clothing?: A Lot 6 Click Score: 14   End of Session Equipment Utilized During Treatment: Gait belt;Rolling walker (2 wheels) Nurse Communication: Mobility status;Other (comment) (Pt aphasic, speaking in tangents, and lethargic)  Activity Tolerance: Patient limited by lethargy Patient left: in bed;with call bell/phone within reach;with family/visitor present  OT Visit Diagnosis: Unsteadiness on feet (R26.81);Other abnormalities of gait and mobility (R26.89);Muscle weakness (generalized) (M62.81);Other symptoms and signs involving cognitive function                Time: 8477-8451 OT Time Calculation (min): 26 min Charges:  OT General  Charges $OT Visit: 1 Visit OT Evaluation $OT Eval Moderate Complexity: 1 Mod OT Treatments $Self Care/Home Management : 8-22 mins  [address] OTR/L   Elouise JONELLE Bott 10/04/2024, 4:06 PM

## 2024-10-04 NOTE — Assessment & Plan Note (Signed)
 Nutrition Problem: Severe Malnutrition Etiology: acute illness Signs/Symptoms: mild fat depletion, moderate muscle depletion, energy intake < or equal to 50% for > or equal to 5 days, percent weight loss (7.5% in 5 weeks) Percent weight loss: 7.5 % (in 5 weeks) Interventions: Refer to RD note for recommendations, Boost Breeze, Magic cup

## 2024-10-04 NOTE — Progress Notes (Addendum)
 Initial Nutrition Assessment  DOCUMENTATION CODES:   Severe malnutrition in context of acute illness/injury  INTERVENTION:  - Clear Liquid diet per MD. - Once able to add supplements, recommend:  - Boost Breeze po BID, each supplement provides 250 kcal and 9 grams of protein  - Magic cup BID with meals, each supplement provides 290 kcal and 9 grams of protein - Monitor weight trends.  NUTRITION DIAGNOSIS:   Severe Malnutrition related to acute illness as evidenced by mild fat depletion, moderate muscle depletion, energy intake < or equal to 50% for > or equal to 5 days, percent weight loss (7.5% in 5 weeks).  GOAL:   Patient will meet greater than or equal to 90% of their needs  MONITOR:   PO intake, Supplement acceptance, Diet advancement, Weight trends  REASON FOR ASSESSMENT:   Consult Assessment of nutrition requirement/status  ASSESSMENT:   78 y.o. female with PMH HTN, anxiety, HLD, and hypothyroidism who presented with abdominal pain. Imaging showed new numerous liver lesions concerning for metastatic disease, possible colon mass.    Patient in bed at time of visit, husband at bedside. Patient somewhat confused but with help of husband able to obtain nutrition related history.  Per EMR, patient was weighed at 133# in November and now weighed at 123#. This is a 10# or 7.5% weight loss in 5 weeks, which is significant for the time frame. Patient and husband confirm this is accurate.  Husband reports that over the past 1 month patient has only been eating very soft foods (mashed potatoes for example) and liquids. Over the past week husband has been trying to get her to drink Ensure but she doesn't really like them.   Previously on a regular diet and consuming 20-100% of meals. Was ordered Ensure BID but not accepting. Patient is currently on a clear liquid diet but plan for NPO tomorrow for colonoscopy. Patient agreeable to try Boost Breeze and [employer] (any flavor) once  diet advanced.     Medications reviewed and include: -  Labs reviewed:  -  NUTRITION - FOCUSED PHYSICAL EXAM:  Flowsheet Row Most Recent Value  Orbital Region Moderate depletion  Upper Arm Region No depletion  Thoracic and Lumbar Region No depletion  Buccal Region Mild depletion  Temple Region Mild depletion  Clavicle Bone Region Moderate depletion  Clavicle and Acromion Bone Region Severe depletion  Scapular Bone Region Unable to assess  Dorsal Hand Mild depletion  Patellar Region No depletion  Anterior Thigh Region No depletion  Posterior Calf Region No depletion  Edema (RD Assessment) None  Hair Reviewed  Eyes Reviewed  Mouth Reviewed  Skin Reviewed  Nails Reviewed    Diet Order:   Diet Order             Diet clear liquid Room service appropriate? Yes; Fluid consistency: Thin  Diet effective now                   EDUCATION NEEDS:  Education needs have been addressed  Skin:  Skin Assessment: Reviewed RN Assessment  Last BM:  12/20  Height:  Ht Readings from Last 1 Encounters:  10/01/24 5' 2 (1.575 m)   Weight:  Wt Readings from Last 1 Encounters:  10/01/24 55.8 kg   BMI:  Body mass index is 22.5 kg/m.  Estimated Nutritional Needs:  Kcal:  1700-1950 kcals Protein:  75-85 grams Fluid:  >/= 1.7L    Trude Ned RD, LDN Contact via Secure Chat.

## 2024-10-04 NOTE — Assessment & Plan Note (Addendum)
 Unclear etiology, patient with no prior history Possibly related to anxiety and dehydration - but not improving with IVF so dehydration is less likely Low suspicion for sepsis Start metoprolol  IV metoprolol  as needed EKG with sinus tachycardia and PVCs (not afib) HR is down to low 100s at this time

## 2024-10-04 NOTE — Assessment & Plan Note (Addendum)
 Tachycardia and leukocytosis on presentation Low suspicion for acute bacterial infection as the patient has no fever, diarrhea CT findings could easily be from underlying inflammatory process She received a dose of IV Zosyn  in the emergency department. No obvious need for ongoing antibiotics at this time

## 2024-10-04 NOTE — Progress Notes (Addendum)
 " Progress Note   Patient: Brandi Ferguson FMW:[id number] DOB: 10-Jan-1946 DOA: 10/01/2024     1 DOS: the patient was seen and examined on 10/04/2024   Brief hospital course: 78yo with h/o HTN, anxiety, HLD, and hypothyroidism who presented on 12/19 with abdominal pain  Imaging showed new liver lesions concerning for metastatic disease, possible colon mass.  CT chest without metastatic disease but did show pulmonary nodules.  She is planned for an US -guided liver lesion biopsy. She also developed SVT which was treated with prn IV metoprolol .  Oncology will arrange for outpatient f/u.     Assessment & Plan Metastatic cancer to liver [facility]) Presented with abdominal pain, anorexia, and weight loss CT with numerous liver masses-concerning for metastatic disease, with unknown primary CT addendum with possible ascending colon mass as primary source She has a very strong FH of colon CA Observation for now Pain control IR consult for CT-guided liver biopsy on 12/22 Discussed with oncology Dr. Onesimo, who will arrange outpatient follow-up CA 19-9 7509, CEA 1937 AFP <1.8, CA 125 17.9 Abnormal LFTs-presumably due to her liver lesions SIRS (systemic inflammatory response syndrome) (HCC) Tachycardia and leukocytosis on presentation Low suspicion for acute bacterial infection as the patient has no fever, diarrhea CT findings could easily be from underlying inflammatory process She received a dose of IV Zosyn  in the emergency department. No obvious need for ongoing antibiotics at this time AKI (acute kidney injury) Normal baseline creatinine, up to 1.2 this AM Suspect mild volume depletion in the setting of SVT Given IVF Acute metabolic encephalopathy Acute onset of AMS on 12/22 It was present in the AM, worse after biopsy Patient reported not sleeping well last night Husband reports periodic confusion at home Likely hospital-associated delirium, exacerbated by medication given today during  biopsy However, brain mets are a consideration and imaging may need to be performed if her MS does not clear soon Will give Seroquel  SVT (supraventricular tachycardia) Unclear etiology, patient with no prior history Possibly related to anxiety and dehydration - but not improving with IVF so dehydration is less likely Low suspicion for sepsis Start metoprolol  IV metoprolol  as needed EKG with sinus tachycardia and PVCs (not afib) HR is down to low 100s at this time Hypothyroidism Normal TSH Continue Synthroid   Hyperlipidemia Hold lovastatin  in the setting of elevated LFTs Essential hypertension Hold HCTZ Anxiety Continue prn Xanax , trazodone  qhs Generalized weakness Likely related to underlying condition PT/OT consulted Nutrition consult Protein-calorie malnutrition, severe Nutrition Problem: Severe Malnutrition Etiology: acute illness Signs/Symptoms: mild fat depletion, moderate muscle depletion, energy intake < or equal to 50% for > or equal to 5 days, percent weight loss (7.5% in 5 weeks) Percent weight loss: 7.5 % (in 5 weeks) Interventions: Refer to RD note for recommendations, Boost Breeze, Magic cup       Consultants: IR Oncology - telephone only GI - telephone only PT OT   Procedures: US -guided liver biopsy 12/22   Antibiotics: Zosyn  12/19-20  30 Day Unplanned Readmission Risk Score    Flowsheet Row ED to Hosp-Admission (Current) from 10/01/2024 in [facility]  30 Day Unplanned Readmission Risk Score (%) 11.76 Filed at 10/04/2024 0801    This score is the patient's risk of an unplanned readmission within 30 days of being discharged (0 -100%). The score is based on dignosis, age, lab data, medications, orders, and past utilization.   Low:  0-14.9   Medium: 15-21.9   High: 22-29.9   Extreme: 30 and above  Subjective: She is somewhat confused today.  She reports having been up since 4am, awaiting biopsy.  I spoke with her  husband,.  She does have periodic confusion occasionally at home.   She is still loopy and she did some better as OT progressed.  Colonoscopy tomorrow AM.  She is not doing well with her prep.  She may require NG tube placement for the prep; Dr. Saintclair is in agreement.  Will also give Seroquel .   Objective: Vitals:   10/03/24 2015 10/04/24 0524  BP: (!) 149/75 136/68  Pulse: (!) 105 (!) 102  Resp: 18 18  Temp: 98.3 F (36.8 C) 98.1 F (36.7 C)  SpO2: 96% 96%   No intake or output data in the 24 hours ending 10/04/24 0903 Filed Weights   10/01/24 0813  Weight: 55.8 kg    Exam:  General:  Appears calm and comfortable and is in NAD Eyes:  normal lids, iris ENT:  grossly normal hearing, lips & tongue, mmm Cardiovascular:  RRR. No LE edema.  Respiratory:   CTA bilaterally with no wheezes/rales/rhonchi.  Normal respiratory effort. Abdomen:  soft, mild diffuse TTP that is improved, ND Skin:  no rash or induration seen on limited exam Musculoskeletal:  grossly normal tone BUE/BLE, good ROM, no bony abnormality Psychiatric:  mildly confused mood and affect, speech fluent and mildly inappropriate, AOx3 Neurologic:  CN 2-12 grossly intact, moves all extremities in coordinated fashion  Data Reviewed: I have reviewed the patient's lab results since admission.  Pertinent labs for today include:   Normal BMP AP 285 Albumin 2.1 AST 68/ALT 15/Bili 1.4 WBC 24 Hgb 9.6 Platelets 523 INR 1.7    Family Communication: None present; I spoke with her husband by telephone      Code Status: Full Code  Disposition: Status is: Inpatient Remains inpatient appropriate because: ongoing monitoring     Time spent: 50 minutes  Unresulted Labs (From admission, onward)     Start     Ordered   10/05/24 0500  CBC  Tomorrow morning,   R       Question:  Specimen collection method  Answer:  Lab=Lab collect   10/04/24 0903   10/05/24 0500  Comprehensive metabolic panel with GFR  Tomorrow  morning,   R       Question:  Specimen collection method  Answer:  Lab=Lab collect   10/04/24 9096             Author: Delon Herald, MD 10/04/2024 9:03 AM  For on call review [email or web address].            "

## 2024-10-04 NOTE — Assessment & Plan Note (Addendum)
 Likely related to underlying condition PT/OT consulted Nutrition consult

## 2024-10-04 NOTE — Procedures (Signed)
" °  Procedure:  US  core biopsy R liver lesion 18g x3 Preprocedure diagnosis: The encounter diagnosis was Abdominal pain, unspecified abdominal location. Postprocedure diagnosis: same EBL:    minimal Complications:   none immediate  See full dictation in [employer].  CHARM Toribio Faes MD Main # [phone] Pager  [phone] Mobile [phone]    "

## 2024-10-04 NOTE — Progress Notes (Signed)
 Started pts new bowel prep. Pt only taking sips and placing the cup on her table.This nurse has re-educated pt multiple times not to place the cup on the table. Pt has verbalized understanding but nods off to sleep. This nurse is concerned that bowel prep will not get completed in a timely manner. Providers aware and care continues.

## 2024-10-04 NOTE — Progress Notes (Signed)
 Pt unable to tolerate bowel prep. Providers notified and order for NG place.This nurse attempted to insert NG and was unsuccessful. Pt pulling the tube out as this nurse was advancing tube. 2nd nurse at the bedside and NG insertion unsuccessful as well. Pt refused NG tube. Providers aware and see orders

## 2024-10-04 NOTE — Plan of Care (Signed)
  Problem: Education: Goal: Knowledge of General Education information will improve Description: Including pain rating scale, medication(s)/side effects and non-pharmacologic comfort measures Outcome: Progressing   Problem: Activity: Goal: Risk for activity intolerance will decrease Outcome: Progressing   Problem: Elimination: Goal: Will not experience complications related to bowel motility Outcome: Progressing   Problem: Pain Managment: Goal: General experience of comfort will improve and/or be controlled Outcome: Progressing   Problem: Skin Integrity: Goal: Risk for impaired skin integrity will decrease Outcome: Progressing

## 2024-10-04 NOTE — Assessment & Plan Note (Addendum)
 Normal baseline creatinine, up to 1.2 this AM Suspect mild volume depletion in the setting of SVT Given IVF

## 2024-10-04 NOTE — Assessment & Plan Note (Addendum)
 Normal TSH.  Continue Synthroid ?

## 2024-10-04 NOTE — Assessment & Plan Note (Addendum)
 Presented with abdominal pain, anorexia, and weight loss CT with numerous liver masses-concerning for metastatic disease, with unknown primary CT addendum with possible ascending colon mass as primary source She has a very strong FH of colon CA Observation for now Pain control IR consult for CT-guided liver biopsy on 12/22 Discussed with oncology Dr. Onesimo, who will arrange outpatient follow-up CA 19-9 7509, CEA 1937 AFP <1.8, CA 125 17.9 Abnormal LFTs-presumably due to her liver lesions

## 2024-10-04 NOTE — Consult Note (Signed)
 [facility] Gastroenterology Consult  Referring Provider: No ref. provider found Primary Care Physician:  Rollene Almarie LABOR, MD Primary Gastroenterologist: Dr. Mancel GI  Reason for Consultation: Suspected colon mass  HPI: Brandi Ferguson is a 78 y.o. female who was admitted on 10/01/2024 with abdominal pain.  She has a past medical history of hypertension, dyslipidemia, hypothyroidism, anxiety. Her last office visit with Dr. Burnette on 07/08/2024 for constipation. She has family history of colon cancer in her sister at age 104, also has family history of colon cancer in paternal aunt and paternal grandmother. Patient states that for the past 5 to 6 weeks she has been feeling unusually tired, has had 10 pound unintentional weight loss. She has been suffering from intermittent constipation for which she has been taking laxatives and when she gets diarrhea she has been taking antidiarrheals.  Her husband present at bedside states she suffers from IBS, has diffuse abdominal cramping and has been having issues with her hemorrhoids described as pain and burning, not improving with Preparation H. Patient states she has required several IV iron  infusions.  Previous GI workup: Colonoscopy 07/14/2020, Dr. Burnette: Lipomatous ileocecal valve, tortuous colon, 3 tubular adenomas removed from transverse colon, diverticulosis, repeat recommended in 5 years  Colonoscopy, 05/2015, Dr. Dyane: Sigmoid diverticulosis, repeat recommended in 5 years  Colonoscopy, 05/11/2010, Dr. 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   Anxiety 1967   Have had anxiety started in high school   ANXIETY DEPRESSION 02/06/2008   Cataract Surgery 04/2022   Cough 09/27/2008   DIVERTICULOSIS OF COLON 02/06/2008   HYPERLIPIDEMIA 06/27/2008   HYPERTENSION 02/06/2008   HYPOTHYROIDISM 02/06/2008   Irritable bowel  syndrome 02/06/2008   Lump or mass in breast 02/06/2008   Overweight(278.02) 06/27/2008   SINUSITIS- ACUTE-NOS 01/04/2010   URI 09/04/2009    Past Surgical History:  Procedure Laterality Date   ABDOMINAL HYSTERECTOMY  May 2011   Total hysterectomy   BREAST EXCISIONAL BIOPSY Right 1984   BREAST SURGERY  1984   Right lumpectomy- Benign   CERVICAL CONE BIOPSY  1977   EYE SURGERY  Biopsy lt eye 2020   Cataract surgery on both eyes 04/2022   LAPAROSCOPY  1972   Fertility work-up   SKIN LESION EXCISION  2006   Left breast   TONSILLECTOMY      Prior to Admission medications  Medication Sig Start Date End Date Taking? Authorizing Provider  ALPRAZolam  (XANAX ) 0.5 MG tablet Take 1 tablet (0.5 mg total) by mouth 3 (three) times daily as needed. Patient taking differently: Take 0.5 mg by mouth in the morning, at noon, and at bedtime. 04/21/24  Yes Rollene Almarie LABOR, MD  hydrochlorothiazide  (HYDRODIURIL ) 25 MG tablet Take 0.5 tablets (12.5 mg total) by mouth daily. 09/08/23  Yes Rollene Almarie LABOR, MD  levothyroxine  (SYNTHROID ) 88 MCG tablet Take 1 tablet (88 mcg total) by mouth daily before breakfast. 06/03/24  Yes Rollene Almarie LABOR, MD  lovastatin  (MEVACOR ) 40 MG tablet TAKE 1 TABLET BY MOUTH EVERYDAY AT BEDTIME Patient taking differently: Take 40 mg by mouth at bedtime. 03/17/24  Yes Rollene Almarie LABOR, MD  pantoprazole  (PROTONIX ) 40 MG tablet Take 1 tablet (40 mg total) by mouth daily. 09/14/24  Yes Rollene Almarie LABOR, MD  polyethylene glycol powder (GLYCOLAX /MIRALAX ) 17 GM/SCOOP powder Take 17 g by mouth 2 (two) times daily as needed for moderate constipation. 04/21/24  Yes Rollene Almarie LABOR, MD  traZODone  (DESYREL ) 50 MG tablet Take 1 tablet (50 mg total) by mouth at bedtime. 09/14/24  Yes Rollene Almarie LABOR, MD    Current Facility-Administered Medications  Medication Dose Route Frequency Provider Last Rate Last Admin   acetaminophen  (TYLENOL ) tablet 650 mg  650 mg Oral  Q6H PRN Zella, Mir M, MD       Or   acetaminophen  (TYLENOL ) suppository 650 mg  650 mg Rectal Q6H PRN Zella, Mir M, MD       albuterol  (PROVENTIL ) (2.5 MG/3ML) 0.083% nebulizer solution 2.5 mg  2.5 mg Nebulization Q2H PRN Zella, Mir M, MD       ALPRAZolam  (XANAX ) tablet 0.5 mg  0.5 mg Oral TID PRN Zella, Mir M, MD   0.5 mg at 10/03/24 2056   feeding supplement (ENSURE PLUS HIGH PROTEIN) liquid 237 mL  237 mL Oral BID BM Zella, Mir M, MD   237 mL at 10/03/24 1116   fentaNYL  (SUBLIMAZE ) injection 12.5-50 mcg  12.5-50 mcg Intravenous Q2H PRN Zella Katha HERO, MD       lactated ringers  infusion   Intravenous Continuous Barbarann Nest, MD 75 mL/hr at 10/04/24 0331 New Bag at 10/04/24 0331   levothyroxine  (SYNTHROID ) tablet 88 mcg  88 mcg Oral QAC breakfast Zella, Mir M, MD   88 mcg at 10/04/24 0532   metoprolol  tartrate (LOPRESSOR ) injection 5 mg  5 mg Intravenous Q8H PRN Andrez Chroman, NP   5 mg at 10/03/24 9767   metoprolol  tartrate (LOPRESSOR ) tablet 25 mg  25 mg Oral BID Barbarann Nest, MD   25 mg at 10/04/24 0932   ondansetron  (ZOFRAN ) tablet 4 mg  4 mg Oral Q6H PRN Zella Katha HERO, MD       Or   ondansetron  (ZOFRAN ) injection 4 mg  4 mg Intravenous Q6H PRN Zella Katha HERO, MD       Oral care mouth rinse  15 mL Mouth Rinse PRN Zella, Mir M, MD       oxyCODONE  (Oxy IR/ROXICODONE ) immediate release tablet 5 mg  5 mg Oral Q4H PRN Zella, Mir M, MD   5 mg at 10/04/24 0932   traZODone  (DESYREL ) tablet 25 mg  25 mg Oral QHS PRN Zella Katha HERO, MD   25 mg at 10/03/24 2056   Facility-Administered Medications Ordered in Other Encounters  Medication Dose Route Frequency Provider Last Rate Last Admin   iron  sucrose (VENOFER ) 200 mg in sodium chloride  0.9 % 100 mL IVPB  200 mg Intravenous Once Rollene Almarie LABOR, MD        Allergies as of 10/01/2024   (No Known Allergies)    Family History  Problem Relation Age of Onset   Alzheimer's disease  Mother    Alcohol  abuse Father    Diabetes Father    Hyperlipidemia Father    Hypertension Father    Coronary artery disease Father    Cancer Sister     Social History   Socioeconomic History   Marital status: Married    Spouse name: Marinell   Number of children: 1   Years of education: 16   Highest education level: Bachelor's degree (e.g., BA, AB, BS)  Occupational History   Occupation: Retired/ Horticulturist, commercial  Tobacco Use   Smoking status: Never   Smokeless tobacco: Never  Vaping Use   Vaping status: Never Used  Substance and Sexual Activity   Alcohol  use: No   Drug use: No   Sexual activity: Yes    Partners: Male  Other Topics Concern   Not on file  Social History Narrative   HSG, Completed [employer] BA -ed. Married 74, 1 adopted daughter 52. 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 Marriage in [place]. 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 Prolonged artificial hydration or nutrition.    Social Drivers of Health   Tobacco Use: Low Risk (10/01/2024)   Patient History    Smoking Tobacco Use: Never    Smokeless Tobacco Use: Never    Passive Exposure: Not on file  Financial Resource Strain: Low Risk (09/13/2024)   Overall Financial Resource Strain (CARDIA)    Difficulty of Paying Living Expenses: Not hard at all  Food Insecurity: No Food Insecurity (10/01/2024)   Epic    Worried About Programme Researcher, Broadcasting/film/video in the Last Year: Never true    Ran Out of Food in the Last Year: Never true  Transportation Needs: No Transportation Needs (10/01/2024)   Epic    Lack of Transportation (Medical): No    Lack of Transportation (Non-Medical): No  Physical Activity: Insufficiently Active (09/13/2024)   Exercise Vital Sign    Days of Exercise per Week: 2 days    Minutes of Exercise per Session: 20 min  Stress: Stress Concern Present  (09/13/2024)   [employer] of Occupational Health - Occupational Stress Questionnaire    Feeling of Stress: Very much  Social Connections: Socially Integrated (10/01/2024)   Social Connection and Isolation Panel    Frequency of Communication with Friends and Family: Twice a week    Frequency of Social Gatherings with Friends and Family: Once a week    Attends Religious Services: More than 4 times per year    Active Member of Clubs or Organizations: Yes    Attends Banker Meetings: More than 4 times per year    Marital Status: Married  Catering Manager Violence: Not At Risk (10/01/2024)   Epic    Fear of Current or Ex-Partner: No    Emotionally Abused: No    Physically Abused: No    Sexually Abused: No  Depression (PHQ2-9): Low Risk (08/26/2024)   Depression (PHQ2-9)    PHQ-2 Score: 0  Alcohol  Screen: Low Risk (09/04/2023)   Alcohol  Screen    Last Alcohol  Screening Score (AUDIT): 0  Housing: Low Risk (10/01/2024)   Epic    Unable to Pay for Housing in the Last Year: No    Number of Times Moved in the Last Year: 0    Homeless in the Last Year: No  Utilities: Not At Risk (10/01/2024)   Epic    Threatened with loss of utilities: No  Health Literacy: Adequate Health Literacy (08/26/2024)   B1300 Health Literacy    Frequency of need for help with medical instructions: Never    Review of Systems: As per HPI.  Physical Exam: Vital signs in last 24 hours: Temp:  [97.4 F (36.3 C)-98.3 F (36.8 C)] 98.1 F (36.7 C) (12/22 0524) Pulse Rate:  [95-105] 96 (12/22 0932) Resp:  [16-18] 18 (12/22 0524) BP: (132-149)/(67-76) 141/71 (12/22 0932) SpO2:  [95 %-97 %] 96 % (12/22 0524) Last BM Date : 10/02/24  General:   Alert,  Well-developed, well-nourished, pleasant and cooperative in NAD Head:  Normocephalic and atraumatic. Eyes:  Sclera clear, no icterus.   Conjunctiva pink. Ears:  Normal auditory acuity. Nose:  No deformity, discharge,  or lesions.  Mouth:  No  deformity or lesions.  Oropharynx pink & moist. Neck:  Supple; no masses or thyromegaly. Lungs:  Clear throughout to auscultation.   No wheezes, crackles, or rhonchi. No acute distress. Heart:  Regular rate and rhythm; no murmurs, clicks, rubs,  or gallops. Extremities:  Without clubbing or edema. Neurologic:  Alert and  oriented x4;  grossly normal neurologically. Skin:  Intact without significant lesions or rashes. Psych:  Alert and cooperative. Normal mood and affect. Abdomen:  Soft, nontender and nondistended. No masses, hepatosplenomegaly or hernias noted. Normal bowel sounds, without guarding, and without rebound.         Lab Results: Recent Labs    10/02/24 0607 10/04/24 0531  WBC 18.9* 24.0*  HGB 8.6* 9.6*  HCT 27.6* 30.3*  PLT 509* 523*   BMET Recent Labs    10/02/24 0607 10/04/24 0531  NA 138 136  K 3.6 3.7  CL 104 103  CO2 23 23  GLUCOSE 100* 84  BUN 12 20  CREATININE 1.12* 0.72  CALCIUM 8.5* 9.0   LFT Recent Labs    10/04/24 0531  PROT 5.3*  ALBUMIN 2.1*  AST 68*  ALT 15  ALKPHOS 285*  BILITOT 1.4*   PT/INR Recent Labs    10/01/24 1416 10/04/24 0531  LABPROT 17.2* 20.4*  INR 1.3* 1.7*    Studies/Results: No results found.  Impression: Abdominal pain, weight loss, iron  deficiency, decreased appetite with abnormal CAT scan concerning for colon mass with metastases  CT abdomen pelvis: Possible primary malignancy involving ascending colon consistent with metastatic disease Fluid in mid to lower right abdomen and mild right paracolic gutter inflammatory fat stranding, wall hiatal hernia, lithiasis versus gallbladder sludge, distended urinary bladder, monic and distal duodenal diverticulosis, actively atrophic left kidney Numerous liver masses   Lab abnormalities: CA 19-9 elevated 7509 CEA elevated 1937  Anemia, hemoglobin 9.6, MCV 72.5, reactive thrombocytosis 523 Leukocytosis WBC 24 Lactic acidosis, 2.5 Mild malnutrition, albumin  2.1, total protein 5.3 Abnormal liver enzymes, ALP 285, AST 68, LA 1.4 Mild coagulopathy, PT 20.4, INR 1.7  Plan: Had liver biopsy performed today. Plan diagnostic colonoscopy tomorrow. Discussed about risk and benefits of the procedure with the patient and husband at bedside. Will keep patient n.p.o. postmidnight, on clear liquid diet and start colonic prep today, split colonic prep dose to be given.   LOS: 1 day   Estelita Manas, MD  10/04/2024, 10:49 AM

## 2024-10-04 NOTE — Assessment & Plan Note (Addendum)
 Continue prn Xanax , trazodone  qhs

## 2024-10-04 NOTE — Assessment & Plan Note (Signed)
 Acute onset of AMS on 12/22 It was present in the AM, worse after biopsy Patient reported not sleeping well last night Husband reports periodic confusion at home Likely hospital-associated delirium, exacerbated by medication given today during biopsy However, brain mets are a consideration and imaging may need to be performed if her MS does not clear soon Will give Seroquel 

## 2024-10-05 ENCOUNTER — Inpatient Hospital Stay (HOSPITAL_COMMUNITY)

## 2024-10-05 ENCOUNTER — Encounter: Payer: Self-pay | Admitting: Cardiovascular Disease

## 2024-10-05 DIAGNOSIS — C787 Secondary malignant neoplasm of liver and intrahepatic bile duct: Secondary | ICD-10-CM | POA: Diagnosis not present

## 2024-10-05 DIAGNOSIS — I471 Supraventricular tachycardia, unspecified: Secondary | ICD-10-CM | POA: Diagnosis not present

## 2024-10-05 LAB — COMPREHENSIVE METABOLIC PANEL WITH GFR
ALT: 12 U/L (ref 0–44)
AST: 56 U/L — ABNORMAL HIGH (ref 15–41)
Albumin: 2 g/dL — ABNORMAL LOW (ref 3.5–5.0)
Alkaline Phosphatase: 220 U/L — ABNORMAL HIGH (ref 38–126)
Anion gap: 15 (ref 5–15)
BUN: 26 mg/dL — ABNORMAL HIGH (ref 8–23)
CO2: 21 mmol/L — ABNORMAL LOW (ref 22–32)
Calcium: 8.9 mg/dL (ref 8.9–10.3)
Chloride: 103 mmol/L (ref 98–111)
Creatinine, Ser: 0.83 mg/dL (ref 0.44–1.00)
GFR, Estimated: >60 mL/min
Glucose, Bld: 56 mg/dL — ABNORMAL LOW (ref 70–99)
Potassium: 3.9 mmol/L (ref 3.5–5.1)
Sodium: 139 mmol/L (ref 135–145)
Total Bilirubin: 1.9 mg/dL — ABNORMAL HIGH (ref 0.0–1.2)
Total Protein: 5.2 g/dL — ABNORMAL LOW (ref 6.5–8.1)

## 2024-10-05 LAB — CBC
HCT: 32.1 % — ABNORMAL LOW (ref 36.0–46.0)
Hemoglobin: 10.2 g/dL — ABNORMAL LOW (ref 12.0–15.0)
MCH: 22.9 pg — ABNORMAL LOW (ref 26.0–34.0)
MCHC: 31.8 g/dL (ref 30.0–36.0)
MCV: 72.1 fL — ABNORMAL LOW (ref 80.0–100.0)
Platelets: 510 K/uL — ABNORMAL HIGH (ref 150–400)
RBC: 4.45 MIL/uL (ref 3.87–5.11)
RDW: 24.9 % — ABNORMAL HIGH (ref 11.5–15.5)
WBC: 22.9 K/uL — ABNORMAL HIGH (ref 4.0–10.5)
nRBC: 0 % (ref 0.0–0.2)

## 2024-10-05 LAB — FOLATE: Folate: 4.5 ng/mL — ABNORMAL LOW

## 2024-10-05 LAB — GLUCOSE, CAPILLARY: Glucose-Capillary: 80 mg/dL (ref 70–99)

## 2024-10-05 LAB — TSH: TSH: 0.568 u[IU]/mL (ref 0.350–4.500)

## 2024-10-05 LAB — VITAMIN B12: Vitamin B-12: 1177 pg/mL — ABNORMAL HIGH (ref 180–914)

## 2024-10-05 LAB — PROCALCITONIN: Procalcitonin: 2.44 ng/mL

## 2024-10-05 LAB — AMMONIA: Ammonia: 70 umol/L — ABNORMAL HIGH (ref 9–35)

## 2024-10-05 MED ORDER — LORAZEPAM 2 MG/ML IJ SOLN
0.5000 mg | INTRAMUSCULAR | Status: DC | PRN
  Administered 2024-10-05 (×2): 0.5 mg via INTRAVENOUS
  Filled 2024-10-05 (×2): qty 1

## 2024-10-05 MED ORDER — AMIODARONE LOAD VIA INFUSION: 150.0000 mg | Freq: Once | INTRAVENOUS | Status: DC

## 2024-10-05 MED ORDER — SODIUM CHLORIDE 0.9% FLUSH: 10.0000 mL | INTRAVENOUS | Status: DC | PRN

## 2024-10-05 MED ORDER — POLYETHYLENE GLYCOL 3350 17 GM/SCOOP PO POWD
238.0000 g | Freq: Once | ORAL | Status: DC
  Filled 2024-10-05: qty 238

## 2024-10-05 MED ORDER — SODIUM CHLORIDE 0.9% FLUSH
10.0000 mL | Freq: Two times a day (BID) | INTRAVENOUS | Status: DC
  Administered 2024-10-05: 30 mL

## 2024-10-05 MED ORDER — PIPERACILLIN-TAZOBACTAM 3.375 G IVPB 30 MIN
3.3750 g | Freq: Three times a day (TID) | INTRAVENOUS | Status: DC
  Administered 2024-10-05 – 2024-10-06 (×2): 3.375 g via INTRAVENOUS
  Filled 2024-10-05 (×2): qty 50

## 2024-10-05 MED ORDER — BISACODYL 5 MG PO TBEC
10.0000 mg | DELAYED_RELEASE_TABLET | Freq: Once | ORAL | Status: AC
  Administered 2024-10-05: 10 mg via ORAL
  Filled 2024-10-05: qty 2

## 2024-10-05 MED ORDER — SODIUM CHLORIDE 0.9 % IV SOLN: INTRAVENOUS | Status: AC

## 2024-10-05 MED ORDER — GADOBUTROL 1 MMOL/ML IV SOLN
6.0000 mL | Freq: Once | INTRAVENOUS | Status: AC | PRN
  Administered 2024-10-05: 6 mL via INTRAVENOUS

## 2024-10-05 MED ORDER — DILTIAZEM LOAD VIA INFUSION
10.0000 mg | Freq: Once | INTRAVENOUS | Status: AC
  Administered 2024-10-05: 10 mg via INTRAVENOUS
  Filled 2024-10-05: qty 10

## 2024-10-05 MED ORDER — AMIODARONE IV BOLUS ONLY 150 MG/100ML
150.0000 mg | Freq: Once | INTRAVENOUS | Status: AC
  Administered 2024-10-05: 150 mg via INTRAVENOUS
  Filled 2024-10-05: qty 100

## 2024-10-05 MED ORDER — ORAL CARE MOUTH RINSE
15.0000 mL | OROMUCOSAL | Status: DC
  Administered 2024-10-05: 15 mL via OROMUCOSAL

## 2024-10-05 MED ORDER — CHLORHEXIDINE GLUCONATE CLOTH 2 % EX PADS: 6.0000 | MEDICATED_PAD | Freq: Every day | CUTANEOUS | Status: DC

## 2024-10-05 MED ORDER — ORAL CARE MOUTH RINSE: 15.0000 mL | OROMUCOSAL | Status: DC | PRN

## 2024-10-05 MED ORDER — DILTIAZEM HCL-DEXTROSE 125-5 MG/125ML-% IV SOLN (PREMIX)
5.0000 mg/h | INTRAVENOUS | Status: DC
  Administered 2024-10-05: 5 mg/h via INTRAVENOUS
  Administered 2024-10-05: 15 mg/h via INTRAVENOUS
  Filled 2024-10-05 (×3): qty 125

## 2024-10-05 NOTE — Plan of Care (Addendum)
 Patient's HR is sustaining 140's-150's. RN gave metoprolol . Pt very lethargic, confused, and urine retention noted. Pt straight cathed this morning by night RN. Pt refusing to drink bowel prep for colonoscopy at this time, will continue to educate and encourage oral intake. MD Tat notified and at the bedside, pt transferring to SD 1229-Report given to [place], RN.  MEWS Progress Note  Patient Details Name: Brandi Ferguson MRN: [id number] DOB: 1946-02-22 Today's Date: 10/05/2024   MEWS Flowsheet Documentation:  Assess: MEWS Score Temp: 98.6 F (37 C) BP: 129/71 MAP (mmHg): 87 Pulse Rate: (!) 110 ECG Heart Rate: 97 Resp: (!) 28 Level of Consciousness: Responds to Voice SpO2: 97 % O2 Device: Room Air O2 Flow Rate (L/min): 2 L/min FiO2 (%): 21 % Assess: MEWS Score MEWS Temp: 0 MEWS Systolic: 0 MEWS Pulse: 1 MEWS RR: 2 MEWS LOC: 1 MEWS Score: 4 MEWS Score Color: Red Assess: SIRS CRITERIA SIRS Temperature : 0 SIRS Respirations : 1 SIRS Pulse: 1 SIRS WBC: 0 SIRS Score Sum : 2     MD Tat notified   Chenay Nesmith Alondra  Mozqueda Jaramillo 10/05/2024, 11:21 AM    Problem: Education: Goal: Knowledge of General Education information will improve Description: Including pain rating scale, medication(s)/side effects and non-pharmacologic comfort measures Outcome: Not Progressing   Problem: Health Behavior/Discharge Planning: Goal: Ability to manage health-related needs will improve Outcome: Not Progressing   Problem: Clinical Measurements: Goal: Ability to maintain clinical measurements within normal limits will improve Outcome: Not Progressing Goal: Will remain free from infection Outcome: Not Progressing Goal: Diagnostic test results will improve Outcome: Not Progressing Goal: Respiratory complications will improve Outcome: Not Progressing Goal: Cardiovascular complication will be avoided Outcome: Not Progressing   Problem: Activity: Goal: Risk for  activity intolerance will decrease Outcome: Not Progressing   Problem: Nutrition: Goal: Adequate nutrition will be maintained Outcome: Not Progressing   Problem: Coping: Goal: Level of anxiety will decrease Outcome: Not Progressing   Problem: Elimination: Goal: Will not experience complications related to bowel motility Outcome: Not Progressing Goal: Will not experience complications related to urinary retention Outcome: Not Progressing   Problem: Pain Managment: Goal: General experience of comfort will improve and/or be controlled Outcome: Not Progressing   Problem: Safety: Goal: Ability to remain free from injury will improve Outcome: Not Progressing   Problem: Skin Integrity: Goal: Risk for impaired skin integrity will decrease Outcome: Not Progressing

## 2024-10-05 NOTE — Progress Notes (Signed)
 PT Cancellation Note  Patient Details Name: Brandi Ferguson MRN: [id number] DOB: 09/04/1946   Cancelled Treatment:    Reason Eval/Treat Not Completed: Medical issues which prohibited therapy. Pt transferred to ICU due to tachycardia with pt HR 140-150's, pt lethargic and confused, urinary retention and pt declining PO intake including bowel prep. PT hold today. PT to continue to follow acutely.   [place], PT Acute Rehab   [name] 10/05/2024, 12:31 PM

## 2024-10-05 NOTE — Progress Notes (Signed)
 This nurse informed providers of pt increase in confusion. This nurse informed provider that during morning assessment pt was a&ox4 with some forgetfulness. After pts liver biopsy pt disoriented, confusion noted and VSS. Provider notified and questioned if pt has possible brain mets per provider It is possible but less likely with colon cancer. More likely hospital delirium + meds from today. If not clearing, we can certainly order imaging. No additional orders at this time. Pt in the bed & in the lowest position, wheels locked, bed alarm on, call light within reach, rise and fall of chest observed and care continues.

## 2024-10-05 NOTE — Progress Notes (Signed)

## 2024-10-05 NOTE — Progress Notes (Signed)
 This RN resumed care of this patient around 0115 received handoff report and reviewed secure chat messaging between provider and off going nurses. Per off going RN and provider communication; patient consumed minimal bowel prep despite education; large volume of the prep remains. Two prior NG tube attempts were made by day shift RNs and were unsuccessful. On call NP advised NG tube placement may be attempted once again if unsuccessful notify GI team if patient unable to finish first and second prep.   This RN went in to assess patient, patient is very drowsy, patient opened eyes and mumbled a few words and then drifted back off to sleep. Unable to get her to drink prep at this time Seroquel  25mg  charted given at 1703 and may contribute to patient drowsiness, unable to safely attempt ngt placement at this time. Care plan continued.

## 2024-10-05 NOTE — Progress Notes (Addendum)
 "          PROGRESS NOTE  Brandi Ferguson FMW:[id number] DOB: 1946-08-12 DOA: 10/01/2024 PCP: Rollene Almarie LABOR, MD  Brief History:  78yo with h/o HTN, anxiety, HLD, and hypothyroidism who presented on 12/19 with abdominal pain  Imaging showed new liver lesions concerning for metastatic disease, possible colon mass.  CT chest without metastatic disease but did show pulmonary nodules.  She is planned for an US -guided liver lesion biopsy. She also developed SVT which was treated with prn IV metoprolol .  Oncology will arrange for outpatient f/u.   Assessment/Plan:  Metastatic cancer to liver [facility]) Presented with abdominal pain, anorexia, and weight loss CT with numerous liver masses-concerning for metastatic disease, with unknown primary CT addendum with possible ascending colon mass as primary source She has a very strong FH of colon CA Observation for now Pain control IR consult for CT-guided liver biopsy on 12/22 Discussed with oncology Dr. Onesimo, who will arrange outpatient follow-up CA 19-9 [id number], CEA 1937 AFP <1.8, CA 125 17.9 Abnormal LFTs-presumably due to her liver lesions -GI to perform colonoscopy 12/23  SIRS (systemic inflammatory response syndrome) (HCC) Tachycardia and leukocytosis on presentation Low suspicion for acute bacterial infection as the patient has no fever, diarrhea CT findings could easily be from underlying inflammatory process She received a dose of IV Zosyn  in the emergency department. No obvious need for ongoing antibiotics at this time --obtain blood cultures x 2 -UA -follow up colonoscopy--if significant inflammation>>start abx  AKI (acute kidney injury) Normal baseline creatinine, up to 1.2 this AM Suspect mild volume depletion in the setting of SVT Given IVF>>improved  Acute metabolic encephalopathy Acute onset of AMS on 12/22 It was present in the AM, worse after biopsy Patient reported not sleeping well last night Husband reports  periodic confusion at home Likely hospital-associated delirium, exacerbated by medication given today during biopsy However, brain mets are a consideration and imaging may need to be performed if her MS does not clear soon Hold Seroquel  and trazodone  due to somnolence -UA -B12 -TSH -ammonia  SVT (supraventricular tachycardia) Unclear etiology, patient with no prior history Possibly related to anxiety and dehydration - but not improving with IVF so dehydration is less likely Low suspicion for sepsis Start metoprolol  IV metoprolol  as needed 12/23 personally reviewed EKG--SVT, nonspecific ST change -start diltiazem  drip -consult cardiology  Hypothyroidism Normal TSH Continue Synthroid    Hyperlipidemia Hold lovastatin  in the setting of elevated LFTs  Essential hypertension Hold hydrochlorothiazide   Anxiety Continue prn Xanax  at bedtime  Generalized weakness  Likely related to underlying condition PT/OT consulted Nutrition consult  Protein-calorie malnutrition, severe Nutrition Problem: Severe Malnutrition Etiology: acute illness Signs/Symptoms: mild fat depletion, moderate muscle depletion, energy intake < or equal to 50% for > or equal to 5 days, percent weight loss (7.5% in 5 weeks) Percent weight loss: 7.5 % (in 5 weeks) Interventions: Refer to RD note for recommendations, Boost Breeze, Magic cup          Consultants: IR Oncology - telephone only GI  PT Cardiology   Procedures: US -guided liver biopsy 12/22   Antibiotics: Zosyn  12/19-20        Family Communication:   no Family at bedside   Code Status:  FULL       Subjective:  Pt is somnolent.  Wakes up easily.  Denies cp, sob, headache.  ROS limited due to somnolence. Objective: Vitals:   10/04/24 2112 10/04/24 2333 10/05/24 0329 10/05/24 0746  BP: (!) 143/89 (!) 136/90 ROLLEN)  143/67 128/76  Pulse: (!) 156 (!) 144 97 88  Resp: 18 18 20  (!) 22  Temp: 98.3 F (36.8 C) 97.6 F (36.4 C)   98.3 F (36.8 C)  TempSrc:   Axillary Oral  SpO2: 98% 95% 95% 98%  Weight:      Height:        Intake/Output Summary (Last 24 hours) at 10/05/2024 0948 Last data filed at 10/05/2024 0615 Gross per 24 hour  Intake --  Output 800 ml  Net -800 ml   Weight change:  Exam:  General:  Pt is alert, follows commands appropriately, not in acute distress HEENT: No icterus, No thrush, No neck mass, [place]/AT Cardiovascular: RRR, S1/S2, no rubs, no gallops Respiratory: CTA bilaterally, no wheezing, no crackles, no rhonchi Abdomen: Soft/+BS, mild diffuse tender, non distended, no guarding Extremities: No edema, No lymphangitis, No petechiae, No rashes, no synovitis   Data Reviewed: I have personally reviewed following labs and imaging studies Basic Metabolic Panel: Recent Labs  Lab 10/01/24 0840 10/02/24 0607 10/03/24 0639 10/04/24 0531 10/05/24 0611  NA 140 138  --  136 139  K 2.8* 3.6  --  3.7 3.9  CL 104 104  --  103 103  CO2 20* 23  --  23 21*  GLUCOSE 104* 100*  --  84 56*  BUN 12 12  --  20 26*  CREATININE 0.89 1.12*  --  0.72 0.83  CALCIUM 9.2 8.5*  --  9.0 8.9  MG 1.8  --  1.7  --   --    Liver Function Tests: Recent Labs  Lab 10/01/24 0840 10/02/24 0607 10/04/24 0531 10/05/24 0611  AST 112* 61* 68* 56*  ALT 30 19 15 12   ALKPHOS 374* 220* 285* 220*  BILITOT 1.3* 1.4* 1.4* 1.9*  PROT 7.1 5.6* 5.3* 5.2*  ALBUMIN 3.3* 2.5* 2.1* 2.0*   Recent Labs  Lab 10/01/24 0840  LIPASE 25   No results for input(s): AMMONIA in the last 168 hours. Coagulation Profile: Recent Labs  Lab 10/01/24 1416 10/04/24 0531  INR 1.3* 1.7*   CBC: Recent Labs  Lab 10/01/24 0840 10/02/24 0607 10/04/24 0531 10/05/24 0611  WBC 18.6* 18.9* 24.0* 22.9*  NEUTROABS  --   --  21.4*  --   HGB 10.1* 8.6* 9.6* 10.2*  HCT 32.1* 27.6* 30.3* 32.1*  MCV 72.0* 73.8* 72.5* 72.1*  PLT 631* 509* 523* 510*   Cardiac Enzymes: No results for input(s): CKTOTAL, CKMB, CKMBINDEX,  TROPONINI in the last 168 hours. BNP: Invalid input(s): POCBNP CBG: No results for input(s): GLUCAP in the last 168 hours. HbA1C: No results for input(s): HGBA1C in the last 72 hours. Urine analysis:    Component Value Date/Time   COLORURINE AMBER (A) 10/01/2024 1108   APPEARANCEUR CLEAR 10/01/2024 1108   LABSPEC 1.012 10/01/2024 1108   PHURINE 5.0 10/01/2024 1108   GLUCOSEU NEGATIVE 10/01/2024 1108   GLUCOSEU NEGATIVE 03/28/2011 0810   HGBUR NEGATIVE 10/01/2024 1108   BILIRUBINUR NEGATIVE 10/01/2024 1108   KETONESUR NEGATIVE 10/01/2024 1108   PROTEINUR NEGATIVE 10/01/2024 1108   UROBILINOGEN 0.2 03/28/2011 0810   NITRITE NEGATIVE 10/01/2024 1108   LEUKOCYTESUR NEGATIVE 10/01/2024 1108   Sepsis Labs: @LABRCNTIP (procalcitonin:4,lacticidven:4) )No results found for this or any previous visit (from the past 240 hours).   Scheduled Meds:  levothyroxine   88 mcg Oral QAC breakfast   metoprolol  tartrate  25 mg Oral BID   Na Sulfate-K Sulfate-Mg Sulfate concentrate  0.5 kit Oral Once  QUEtiapine   25 mg Oral QHS   Continuous Infusions:  sodium chloride      sodium chloride      lactated ringers  75 mL/hr at 10/05/24 9363    Procedures/Studies: US  BIOPSY (LIVER) Result Date: 10/04/2024 EXAM: Ultrasound guided liver lesion biopsy COMPARISON: CT 10/01/2024 CLINICAL HISTORY: Indication / Diagnosis: Liver lesions. Additional clinical history: [id number]. Previous biopsy of same target: No. Complications: No immediate complications. Conscious Sedation: 1 mg Versed  IV, 100 mcg fentanyl  IV. Sedation Technique: Under physician supervision, Versed  and fentanyl  were administered IV for moderate sedation. Pulse oximetry, heart rate, and blood pressure were continuously monitored with an independent trained observer present. Physician face-to-face sedation time: 10 minutes. Radiation dose reduction techniques were utilized as appropriate. Discussion: Time out performed including verification  of patient, date of birth, procedure, and target as appropriate. Informed written consent was obtained. The site was prepared and draped using maximal sterile barrier technique including cutaneous antisepsis. The patient was positioned supine. Initial imaging was performed. Biopsy target: Organ or target location: Liver lesion. Laterality: Right hepatic lobe. Maximal diameter: 3.4 cm. Other findings: Subcapsular, representative lesion. Local anesthesia was administered. Under Ultrasound guidance, the biopsy needle was advanced to the target and biopsy was performed. Biopsy details: Biopsy type: Core Coaxial needle: 17g Core needle device: Biopince Core needle size: 18g Number of core specimens: 3 Fine needle aspiration performed: No On-site assessment of biopsy adequacy: No Preliminary assessment of sample adequacy: Adequate The biopsy needle was removed and a sterile dressing was applied. Tract closure: None Post-procedure imaging: Immediate post-biopsy imaging: US  Post-biopsy imaging findings: No hemorrhage or other immediate complication Contrast: Contrast agent: None. Contrast volume: 0 mL. Estimated blood loss: Less than 10 mL. Specimens sent for evaluation. IMPRESSION: 1. Technically successful biopsy of the 3.4 cm subcapsular right hepatic lobe lesion. 2. No immediate complications. Electronically signed by: Katheleen Faes MD 10/04/2024 04:30 PM EST RP Workstation: [id number]   CT ABDOMEN PELVIS W CONTRAST Addendum Date: 10/01/2024 ADDENDUM REPORT: 10/01/2024 14:17 ADDENDUM: Upon further evaluation, asymmetric mural thickening is seen involving the proximal to mid ascending colon (axial CT images 47 through 56, CT series 2/coronal reformatted images 64 through 95, CT series 7.) These findings, along with the fluid and inflammatory stranding seen within this region may represent a primary malignancy involving the ascending colon. Electronically Signed   By: Suzen Dials M.D.   On: 10/01/2024 14:17    Result Date: 10/01/2024 CLINICAL DATA:  Centralized abdominal pain that moves across her stomach. Patient stated she has lost 10 pounds in the last 7 weeks and feels extremely weak. EXAM: CT ABDOMEN AND PELVIS WITH CONTRAST TECHNIQUE: Multidetector CT imaging of the abdomen and pelvis was performed using the standard protocol following bolus administration of intravenous contrast. RADIATION DOSE REDUCTION: This exam was performed according to the departmental dose-optimization program which includes automated exposure control, adjustment of the mA and/or kV according to patient size and/or use of iterative reconstruction technique. CONTRAST:  90mL OMNIPAQUE  IOHEXOL  300 MG/ML  SOLN COMPARISON:  None Available. FINDINGS: Lower chest: No acute abnormality. Hepatobiliary: Numerous heterogeneous low-attenuation liver mass is are seen. A small layer of gallstones versus gallbladder sludge is seen within the dependent portion of the gallbladder lumen. There is no evidence of biliary dilatation. Pancreas: Unremarkable. No pancreatic ductal dilatation or surrounding inflammatory changes. Spleen: Normal in size without focal abnormality. Adrenals/Urinary Tract: Adrenal glands are unremarkable. The left kidney is not clearly identified and appears to be markedly atrophic. There is mild compensatory hypertrophy of the  right kidney with a subcentimeter simple renal cyst seen within the anterior aspect of the upper pole. No renal calculi or hydronephrosis is identified. The urinary bladder is markedly distended and is otherwise unremarkable. Stomach/Bowel: There is a small hiatal hernia. The appendix is not clearly identified stool is seen throughout the large bowel. No evidence of bowel dilatation. Numerous diverticula are seen throughout the large bowel and distal duodenum. A small amount of Peri cecal fluid is seen with a small amount of peri-intestinal fluid noted within the mid to lower right abdomen. A mild amount of  inflammatory fat stranding is seen along the mid to lower paracolic gutter on the right. Vascular/Lymphatic: Aortic atherosclerosis. No enlarged abdominal or pelvic lymph nodes. Reproductive: Status post hysterectomy. No adnexal masses. Other: No abdominal wall hernia or abnormality. No abdominopelvic ascites. Musculoskeletal: Multilevel degenerative changes are seen throughout the lumbar spine. IMPRESSION: 1. Numerous heterogeneous low-attenuation liver masses, consistent with metastatic disease. 2. Small amount of peri-intestinal fluid within the mid to lower right abdomen with mild right paracolic gutter inflammatory fat stranding which may represent the presence of an underlying inflammatory or infectious process. 3. Small hiatal hernia. 4. Cholelithiasis versus gallbladder sludge. 5. Markedly distended urinary bladder. 6. Colonic and distal duodenal diverticulosis. 7. Markedly atrophic left kidney. 8. Aortic atherosclerosis. Electronically Signed: By: Suzen Dials M.D. On: 10/01/2024 11:33   CT CHEST W CONTRAST Result Date: 10/01/2024 CLINICAL DATA:  Liver mets looking for primary. * Tracking Code: BO * EXAM: CT CHEST WITH CONTRAST TECHNIQUE: Multidetector CT imaging of the chest was performed during intravenous contrast administration. RADIATION DOSE REDUCTION: This exam was performed according to the departmental dose-optimization program which includes automated exposure control, adjustment of the mA and/or kV according to patient size and/or use of iterative reconstruction technique. CONTRAST:  60mL OMNIPAQUE  IOHEXOL  300 MG/ML  SOLN COMPARISON:  None Available. FINDINGS: Cardiovascular: Normal cardiac size. No pericardial effusion. No aortic aneurysm. There are coronary artery calcifications, in keeping with coronary artery disease. There are also mild peripheral atherosclerotic vascular calcifications of thoracic aorta and its major branches. Mediastinum/Nodes: Visualized thyroid  gland appears  grossly unremarkable. No solid / cystic mediastinal masses. The esophagus is nondistended precluding optimal assessment. No axillary, mediastinal or hilar lymphadenopathy by size criteria. Lungs/Pleura: The central tracheo-bronchial tree is patent. There are patchy areas of linear, plate-like atelectasis and/or scarring throughout bilateral lungs. Single emphysematous bleb noted in the right lung lower lobe, posterior inferiorly. No mass or consolidation. No pleural effusion or pneumothorax. There are multiple sub 4 mm solid noncalcified nodules throughout bilateral lungs (marked with electronic arrow sign on series 2004). Please see follow-up recommendations below. Upper Abdomen: Please refer to same-day acquired CT scan abdomen pelvis report for additional details. Musculoskeletal: The visualized soft tissues of the chest wall are grossly unremarkable. No suspicious osseous lesions. There are mild to moderate multilevel degenerative changes in the visualized spine. IMPRESSION: 1. No metastatic disease identified within the chest. 2. Multiple, sub 4 mm, solid noncalcified nodules throughout bilateral lungs. Please see follow-up recommendations below. 3. Multiple other nonacute observations, as described above. Aortic Atherosclerosis (ICD10-I70.0). Pulmonary nodule follow-up recommendation: Solid lung nodules measuring up to 6 mm: - LOW RISK: No routine follow-up. - HIGHER RISK: Optional CT at 12 months. Recommendations for evaluation of incidental nodules derived from guidelines developed by the Fleischner Society ( Radiology 2017; [id number]). These guidelines apply to incidental nodules, which can be managed according to the specific recommendations. These guidelines do not apply to patient's younger than  35 years, immunocompromised patients, or patients with cancer. For lung cancer screening, adherence to the existing [employer] of radiology lung CT Screening Reporting and Data System (lung-RADS)  guidelines is recommended. High risk factors include older age, heavy smoking, larger nodule size, irregular or spiculated margins and upper lobe location. Clinical risk factors include smoking, exposure to other carcinogens, emphysema, fibrosis, upper lobe location, family history of lung cancer, age and sex. Electronically Signed   By: Ree Molt M.D.   On: 10/01/2024 13:43   MM 3D SCREENING MAMMOGRAM BILATERAL BREAST Result Date: 09/28/2024 CLINICAL DATA:  Screening. EXAM: DIGITAL SCREENING BILATERAL MAMMOGRAM WITH TOMOSYNTHESIS AND CAD TECHNIQUE: Bilateral screening digital craniocaudal and mediolateral oblique mammograms were obtained. Bilateral screening digital breast tomosynthesis was performed. The images were evaluated with computer-aided detection. COMPARISON:  Previous exam(s). ACR Breast Density Category b: There are scattered areas of fibroglandular density. FINDINGS: There are no findings suspicious for malignancy. IMPRESSION: No mammographic evidence of malignancy. A result letter of this screening mammogram will be mailed directly to the patient. RECOMMENDATION: Screening mammogram in one year. (Code:SM-B-01Y) BI-RADS CATEGORY  1: Negative. Electronically Signed   By: Corean Salter M.D.   On: 09/28/2024 08:11    Alm Schneider, DO  [facility]  If 7PM-7AM, please contact night-coverage [email or web address] Password [email or web address] 10/05/2024, 9:48 AM   LOS: 2 days   "

## 2024-10-05 NOTE — Plan of Care (Signed)

## 2024-10-05 NOTE — Progress Notes (Signed)
 This RN gave pt's earrings to husband in a labeled denture cup, secured in a biohazard bag.

## 2024-10-05 NOTE — Consult Note (Signed)
 CARDIOLOGY CONSULT NOTE       Patient ID: TANGIA PINARD MRN: [id number] DOB/AGE: [birth year] 78 y.o.  Admit date: 10/01/2024 Referring Physician: Tat Primary Physician: Rollene Almarie LABOR, MD Primary Cardiologist: New Reason for Consultation: SVT  Principal Problem:   Metastatic cancer to liver [facility]) Active Problems:   Hypothyroidism   Hyperlipidemia   Essential hypertension   Generalized weakness   SVT (supraventricular tachycardia)   SIRS (systemic inflammatory response syndrome) (HCC)   Anxiety   AKI (acute kidney injury)   Protein-calorie malnutrition, severe   Acute metabolic encephalopathy   HPI:  78 y.o. admitted to hospital with abdominal pain and change in MS. CT abdomen note likely liver mets with distended bladder She had liver biopsy yesterday and was to have colonoscopy today. She has been lethargic with poor PO intake Ammonia level is elevated at 70. Her LFTls are elevated TB 1.9 AST 56.  She has no significant cardiac history On telemetry she has intermittent long RP tachycardia. ECG confirms this with no delta wave or evidence of bypass tract. She has been on oral lopressor  but PO intake is poor. She is hemodynamically stable with HR varying between 110-154 bpm. 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   Anxiety 1967   Have had anxiety started in high school   ANXIETY DEPRESSION 02/06/2008   Cataract Surgery 04/2022   Cough 09/27/2008   DIVERTICULOSIS OF COLON 02/06/2008   HYPERLIPIDEMIA 06/27/2008   HYPERTENSION 02/06/2008   HYPOTHYROIDISM 02/06/2008   Irritable bowel syndrome 02/06/2008   Lump or mass in breast 02/06/2008   Overweight(278.02) 06/27/2008   SINUSITIS- ACUTE-NOS 01/04/2010   URI 09/04/2009    Family History  Problem Relation Age of Onset   Alzheimer's disease Mother    Alcohol   abuse Father    Diabetes Father    Hyperlipidemia Father    Hypertension Father    Coronary artery disease Father    Cancer Sister     Social History   Socioeconomic History   Marital status: Married    Spouse name: Marinell   Number of children: 1   Years of education: 16   Highest education level: Bachelor's degree (e.g., BA, AB, BS)  Occupational History   Occupation: Retired/ Horticulturist, commercial  Tobacco Use   Smoking status: Never   Smokeless tobacco: Never  Vaping Use   Vaping status: Never Used  Substance and Sexual Activity   Alcohol  use: No   Drug use: No   Sexual activity: Yes    Partners: Male  Other Topics Concern   Not on file  Social History Narrative   HSG, Completed [employer] BA -ed. Married 48, 1 adopted daughter 13. 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 Marriage in [place]. 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 Prolonged artificial hydration or nutrition.    Social Drivers of Health   Tobacco Use: Low Risk (10/01/2024)   Patient History    Smoking Tobacco Use: Never    Smokeless Tobacco Use: Never    Passive Exposure: Not on file  Financial Resource Strain: Low Risk (09/13/2024)   Overall Financial Resource Strain (CARDIA)    Difficulty of Paying Living Expenses: Not hard at all  Food Insecurity: No  Food Insecurity (10/01/2024)   Epic    Worried About Programme Researcher, Broadcasting/film/video in the Last Year: Never true    Ran Out of Food in the Last Year: Never true  Transportation Needs: No Transportation Needs (10/01/2024)   Epic    Lack of Transportation (Medical): No    Lack of Transportation (Non-Medical): No  Physical Activity: Insufficiently Active (09/13/2024)   Exercise Vital Sign    Days of Exercise per Week: 2 days    Minutes of Exercise per Session: 20 min  Stress: Stress Concern Present (09/13/2024)   [employer] - Occupational Stress Questionnaire    Feeling of Stress: Very much  Social Connections: Socially Integrated (10/01/2024)   Social Connection and Isolation Panel    Frequency of Communication with Friends and Family: Twice a week    Frequency of Social Gatherings with Friends and Family: Once a week    Attends Religious Services: More than 4 times per year    Active Member of Clubs or Organizations: Yes    Attends Banker Meetings: More than 4 times per year    Marital Status: Married  Catering Manager Violence: Not At Risk (10/01/2024)   Epic    Fear of Current or Ex-Partner: No    Emotionally Abused: No    Physically Abused: No    Sexually Abused: No  Depression (PHQ2-9): Low Risk (08/26/2024)   Depression (PHQ2-9)    PHQ-2 Score: 0  Alcohol  Screen: Low Risk (09/04/2023)   Alcohol  Screen    Last Alcohol  Screening Score (AUDIT): 0  Housing: Low Risk (10/01/2024)   Epic    Unable to Pay for Housing in the Last Year: No    Number of Times Moved in the Last Year: 0    Homeless in the Last Year: No  Utilities: Not At Risk (10/01/2024)   Epic    Threatened with loss of utilities: No  Health Literacy: Adequate Health Literacy (08/26/2024)   B1300 Health Literacy    Frequency of need for help with medical instructions: Never    Past Surgical History:  Procedure Laterality Date   ABDOMINAL HYSTERECTOMY  May 2011   Total hysterectomy   BREAST EXCISIONAL BIOPSY Right 1984   BREAST SURGERY  1984   Right lumpectomy- Benign   CERVICAL CONE BIOPSY  1977   EYE SURGERY  Biopsy lt eye 2020   Cataract surgery on both eyes 04/2022   LAPAROSCOPY  1972   Fertility work-up   SKIN LESION EXCISION  2006   Left breast   TONSILLECTOMY       Current Medications[1]  diltiazem   10 mg Intravenous Once   levothyroxine   88 mcg Oral QAC breakfast   metoprolol  tartrate  25 mg Oral BID   Na Sulfate-K Sulfate-Mg Sulfate concentrate  0.5 kit Oral Once    polyethylene glycol powder  238 g Oral Once    sodium chloride      sodium chloride      sodium chloride  20 mL/hr at 10/05/24 1116   diltiazem  (CARDIZEM ) infusion     lactated ringers  75 mL/hr at 10/05/24 0636    Physical Exam:    Lethargic female No murmur  Clear lungs Mild RLQ/LLQ tenderness No edema Non focal neuro  Labs:   Lab Results  Component Value Date   WBC 22.9 (H) 10/05/2024   HGB 10.2 (L) 10/05/2024   HCT 32.1 (L) 10/05/2024   MCV 72.1 (L) 10/05/2024   PLT 510 (H) 10/05/2024  Recent Labs  Lab 10/05/24 0611  NA 139  K 3.9  CL 103  CO2 21*  BUN 26*  CREATININE 0.83  CALCIUM 8.9  PROT 5.2*  BILITOT 1.9*  ALKPHOS 220*  ALT 12  AST 56*  GLUCOSE 56*   No results found for: CKTOTAL, CKMB, CKMBINDEX, TROPONINI  Lab Results  Component Value Date   CHOL 109 09/14/2024   CHOL 103 09/08/2023   CHOL 111 09/04/2022   Lab Results  Component Value Date   HDL 26.40 (L) 09/14/2024   HDL 34.20 (L) 09/08/2023   HDL 39.40 09/04/2022   Lab Results  Component Value Date   LDLCALC 64 09/14/2024   LDLCALC 54 09/08/2023   LDLCALC 48 09/04/2022   Lab Results  Component Value Date   TRIG 92.0 09/14/2024   TRIG 75.0 09/08/2023   TRIG 121.0 09/04/2022   Lab Results  Component Value Date   CHOLHDL 4 09/14/2024   CHOLHDL 3 09/08/2023   CHOLHDL 3 09/04/2022   No results found for: LDLDIRECT    Radiology: US  BIOPSY (LIVER) Result Date: 10/04/2024 EXAM: Ultrasound guided liver lesion biopsy COMPARISON: CT 10/01/2024 CLINICAL HISTORY: Indication / Diagnosis: Liver lesions. Additional clinical history: 254089. Previous biopsy of same target: No. Complications: No immediate complications. Conscious Sedation: 1 mg Versed  IV, 100 mcg fentanyl  IV. Sedation Technique: Under physician supervision, Versed  and fentanyl  were administered IV for moderate sedation. Pulse oximetry, heart rate, and blood pressure were continuously monitored with an independent  trained observer present. Physician face-to-face sedation time: 10 minutes. Radiation dose reduction techniques were utilized as appropriate. Discussion: Time out performed including verification of patient, date of birth, procedure, and target as appropriate. Informed written consent was obtained. The site was prepared and draped using maximal sterile barrier technique including cutaneous antisepsis. The patient was positioned supine. Initial imaging was performed. Biopsy target: Organ or target location: Liver lesion. Laterality: Right hepatic lobe. Maximal diameter: 3.4 cm. Other findings: Subcapsular, representative lesion. Local anesthesia was administered. Under Ultrasound guidance, the biopsy needle was advanced to the target and biopsy was performed. Biopsy details: Biopsy type: Core Coaxial needle: 17g Core needle device: Biopince Core needle size: 18g Number of core specimens: 3 Fine needle aspiration performed: No On-site assessment of biopsy adequacy: No Preliminary assessment of sample adequacy: Adequate The biopsy needle was removed and a sterile dressing was applied. Tract closure: None Post-procedure imaging: Immediate post-biopsy imaging: US  Post-biopsy imaging findings: No hemorrhage or other immediate complication Contrast: Contrast agent: None. Contrast volume: 0 mL. Estimated blood loss: Less than 10 mL. Specimens sent for evaluation. IMPRESSION: 1. Technically successful biopsy of the 3.4 cm subcapsular right hepatic lobe lesion. 2. No immediate complications. Electronically signed by: Katheleen Faes MD 10/04/2024 04:30 PM EST RP Workstation: [id number]   CT ABDOMEN PELVIS W CONTRAST Addendum Date: 10/01/2024 ADDENDUM REPORT: 10/01/2024 14:17 ADDENDUM: Upon further evaluation, asymmetric mural thickening is seen involving the proximal to mid ascending colon (axial CT images 47 through 56, CT series 2/coronal reformatted images 64 through 95, CT series 7.) These findings, along with the fluid  and inflammatory stranding seen within this region may represent a primary malignancy involving the ascending colon. Electronically Signed   By: Suzen Dials M.D.   On: 10/01/2024 14:17   Result Date: 10/01/2024 CLINICAL DATA:  Centralized abdominal pain that moves across her stomach. Patient stated she has lost 10 pounds in the last 7 weeks and feels extremely weak. EXAM: CT ABDOMEN AND PELVIS WITH CONTRAST TECHNIQUE: Multidetector CT  imaging of the abdomen and pelvis was performed using the standard protocol following bolus administration of intravenous contrast. RADIATION DOSE REDUCTION: This exam was performed according to the departmental dose-optimization program which includes automated exposure control, adjustment of the mA and/or kV according to patient size and/or use of iterative reconstruction technique. CONTRAST:  90mL OMNIPAQUE  IOHEXOL  300 MG/ML  SOLN COMPARISON:  None Available. FINDINGS: Lower chest: No acute abnormality. Hepatobiliary: Numerous heterogeneous low-attenuation liver mass is are seen. A small layer of gallstones versus gallbladder sludge is seen within the dependent portion of the gallbladder lumen. There is no evidence of biliary dilatation. Pancreas: Unremarkable. No pancreatic ductal dilatation or surrounding inflammatory changes. Spleen: Normal in size without focal abnormality. Adrenals/Urinary Tract: Adrenal glands are unremarkable. The left kidney is not clearly identified and appears to be markedly atrophic. There is mild compensatory hypertrophy of the right kidney with a subcentimeter simple renal cyst seen within the anterior aspect of the upper pole. No renal calculi or hydronephrosis is identified. The urinary bladder is markedly distended and is otherwise unremarkable. Stomach/Bowel: There is a small hiatal hernia. The appendix is not clearly identified stool is seen throughout the large bowel. No evidence of bowel dilatation. Numerous diverticula are seen  throughout the large bowel and distal duodenum. A small amount of Peri cecal fluid is seen with a small amount of peri-intestinal fluid noted within the mid to lower right abdomen. A mild amount of inflammatory fat stranding is seen along the mid to lower paracolic gutter on the right. Vascular/Lymphatic: Aortic atherosclerosis. No enlarged abdominal or pelvic lymph nodes. Reproductive: Status post hysterectomy. No adnexal masses. Other: No abdominal wall hernia or abnormality. No abdominopelvic ascites. Musculoskeletal: Multilevel degenerative changes are seen throughout the lumbar spine. IMPRESSION: 1. Numerous heterogeneous low-attenuation liver masses, consistent with metastatic disease. 2. Small amount of peri-intestinal fluid within the mid to lower right abdomen with mild right paracolic gutter inflammatory fat stranding which may represent the presence of an underlying inflammatory or infectious process. 3. Small hiatal hernia. 4. Cholelithiasis versus gallbladder sludge. 5. Markedly distended urinary bladder. 6. Colonic and distal duodenal diverticulosis. 7. Markedly atrophic left kidney. 8. Aortic atherosclerosis. Electronically Signed: By: Suzen Dials M.D. On: 10/01/2024 11:33   CT CHEST W CONTRAST Result Date: 10/01/2024 CLINICAL DATA:  Liver mets looking for primary. * Tracking Code: BO * EXAM: CT CHEST WITH CONTRAST TECHNIQUE: Multidetector CT imaging of the chest was performed during intravenous contrast administration. RADIATION DOSE REDUCTION: This exam was performed according to the departmental dose-optimization program which includes automated exposure control, adjustment of the mA and/or kV according to patient size and/or use of iterative reconstruction technique. CONTRAST:  60mL OMNIPAQUE  IOHEXOL  300 MG/ML  SOLN COMPARISON:  None Available. FINDINGS: Cardiovascular: Normal cardiac size. No pericardial effusion. No aortic aneurysm. There are coronary artery calcifications, in keeping  with coronary artery disease. There are also mild peripheral atherosclerotic vascular calcifications of thoracic aorta and its major branches. Mediastinum/Nodes: Visualized thyroid  gland appears grossly unremarkable. No solid / cystic mediastinal masses. The esophagus is nondistended precluding optimal assessment. No axillary, mediastinal or hilar lymphadenopathy by size criteria. Lungs/Pleura: The central tracheo-bronchial tree is patent. There are patchy areas of linear, plate-like atelectasis and/or scarring throughout bilateral lungs. Single emphysematous bleb noted in the right lung lower lobe, posterior inferiorly. No mass or consolidation. No pleural effusion or pneumothorax. There are multiple sub 4 mm solid noncalcified nodules throughout bilateral lungs (marked with electronic arrow sign on series 2004). Please see follow-up recommendations  below. Upper Abdomen: Please refer to same-day acquired CT scan abdomen pelvis report for additional details. Musculoskeletal: The visualized soft tissues of the chest wall are grossly unremarkable. No suspicious osseous lesions. There are mild to moderate multilevel degenerative changes in the visualized spine. IMPRESSION: 1. No metastatic disease identified within the chest. 2. Multiple, sub 4 mm, solid noncalcified nodules throughout bilateral lungs. Please see follow-up recommendations below. 3. Multiple other nonacute observations, as described above. Aortic Atherosclerosis (ICD10-I70.0). Pulmonary nodule follow-up recommendation: Solid lung nodules measuring up to 6 mm: - LOW RISK: No routine follow-up. - HIGHER RISK: Optional CT at 12 months. Recommendations for evaluation of incidental nodules derived from guidelines developed by the Fleischner Society ( Radiology 2017; [phone]). These guidelines apply to incidental nodules, which can be managed according to the specific recommendations. These guidelines do not apply to patient's younger than 35 years,  immunocompromised patients, or patients with cancer. For lung cancer screening, adherence to the existing [employer] of radiology lung CT Screening Reporting and Data System (lung-RADS) guidelines is recommended. High risk factors include older age, heavy smoking, larger nodule size, irregular or spiculated margins and upper lobe location. Clinical risk factors include smoking, exposure to other carcinogens, emphysema, fibrosis, upper lobe location, family history of lung cancer, age and sex. Electronically Signed   By: Ree Molt M.D.   On: 10/01/2024 13:43   MM 3D SCREENING MAMMOGRAM BILATERAL BREAST Result Date: 09/28/2024 CLINICAL DATA:  Screening. EXAM: DIGITAL SCREENING BILATERAL MAMMOGRAM WITH TOMOSYNTHESIS AND CAD TECHNIQUE: Bilateral screening digital craniocaudal and mediolateral oblique mammograms were obtained. Bilateral screening digital breast tomosynthesis was performed. The images were evaluated with computer-aided detection. COMPARISON:  Previous exam(s). ACR Breast Density Category b: There are scattered areas of fibroglandular density. FINDINGS: There are no findings suspicious for malignancy. IMPRESSION: No mammographic evidence of malignancy. A result letter of this screening mammogram will be mailed directly to the patient. RECOMMENDATION: Screening mammogram in one year. (Code:[id number]) BI-RADS CATEGORY  1: Negative. Electronically Signed   By: Corean Salter M.D.   On: 09/28/2024 08:11    EKG: SVT long RP rate 154   ASSESSMENT AND PLAN:   SVT:  in setting of poor PO intake, electrolyte abnormalities, elevated ammonia and recent propofol for liver biopsy. Continue oral lopressor . Start iv amiodarone  with bolus No need for adenosine as she intermittently breaks on her own  Cancer:  ? New diagnosis of metastatic colon cancer. Colonoscopy will need to be cancelled today. She has change in MS and needs head CT/MRI. Rx elevated ammonia with miralax  Should have tissue  diagnosis with liver biopsy. 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 Strong family history of colon cancer including sister at age 54  Signed: Maude Emmer 10/05/2024, 11:59 AM      [1]  Current Facility-Administered Medications:    0.9 %  sodium chloride  infusion, , Intravenous, Continuous, Saintclair Jasper, MD   0.9 %  sodium chloride  infusion, , Intravenous, Continuous, Karki, Arya, MD   0.9 %  sodium chloride  infusion, , Intravenous, Continuous, Karki, Arya, MD, Last Rate: 20 mL/hr at 10/05/24 1116, New Bag at 10/05/24 1116   acetaminophen  (TYLENOL ) tablet 650 mg, 650 mg, Oral, Q6H PRN **OR** acetaminophen  (TYLENOL ) suppository 650 mg, 650 mg, Rectal, Q6H PRN, Zella, Mir M, MD   albuterol  (PROVENTIL ) (2.5 MG/3ML) 0.083% nebulizer solution 2.5 mg, 2.5  mg, Nebulization, Q2H PRN, Zella, Mir M, MD   ALPRAZolam  (XANAX ) tablet 0.5 mg, 0.5 mg, Oral, TID PRN, Zella, Mir M, MD, 0.5 mg at 10/03/24 2056   diltiazem  (CARDIZEM ) 1 mg/mL load via infusion 10 mg, 10 mg, Intravenous, Once **AND** diltiazem  (CARDIZEM ) 125 mg in dextrose  5% 125 mL (1 mg/mL) infusion, 5-15 mg/hr, Intravenous, Continuous, Tat, David, MD   fentaNYL  (SUBLIMAZE ) injection 12.5-50 mcg, 12.5-50 mcg, Intravenous, Q2H PRN, Zella, Mir M, MD   HYDROcodone -acetaminophen  (NORCO/VICODIN) 5-325 MG per tablet 1-2 tablet, 1-2 tablet, Oral, Q4H PRN, Johann Sieving, MD   lactated ringers  infusion, , Intravenous, Continuous, Barbarann Nest, MD, Last Rate: 75 mL/hr at 10/05/24 0636, New Bag at 10/05/24 0636   levothyroxine  (SYNTHROID ) tablet 88 mcg, 88 mcg, Oral, QAC breakfast, Zella, Mir M, MD, 88 mcg at 10/04/24 0532   metoprolol  tartrate (LOPRESSOR ) injection 5 mg, 5 mg, Intravenous, Q8H PRN, Chavez, Abigail, NP, 5 mg at 10/05/24 0508   metoprolol  tartrate (LOPRESSOR )  tablet 25 mg, 25 mg, Oral, BID, Barbarann Nest, MD, 25 mg at 10/05/24 1019   [COMPLETED] Na Sulfate-K Sulfate-Mg Sulfate concentrate (SUPREP) kit 177 mL, 0.5 kit, Oral, Once, 177 mL at 10/04/24 1830 **FOLLOWED BY** Na Sulfate-K Sulfate-Mg Sulfate concentrate (SUPREP) kit 177 mL, 0.5 kit, Oral, Once, Saintclair Jasper, MD   ondansetron  (ZOFRAN ) tablet 4 mg, 4 mg, Oral, Q6H PRN **OR** ondansetron  (ZOFRAN ) injection 4 mg, 4 mg, Intravenous, Q6H PRN, Zella, Mir M, MD   Oral care mouth rinse, 15 mL, Mouth Rinse, PRN, Zella, Mir M, MD   oxyCODONE  (Oxy IR/ROXICODONE ) immediate release tablet 5 mg, 5 mg, Oral, Q4H PRN, Zella, Mir M, MD, 5 mg at 10/04/24 0932   polyethylene glycol powder (GLYCOLAX /MIRALAX ) container 238 g, 238 g, Oral, Once, Karki, Arya, MD  Facility-Administered Medications Ordered in Other Encounters:    iron  sucrose (VENOFER ) 200 mg in sodium chloride  0.9 % 100 mL IVPB, 200 mg, Intravenous, Once, Rollene Almarie LABOR, MD

## 2024-10-05 NOTE — Progress Notes (Signed)
 Subjective: Patient was not able to tolerate Nulytely , was subsequently given Suprep, however has not finished prep.  Colonoscopy will be rescheduled for tomorrow.  Objective: Vital signs in last 24 hours: Temp:  [97.6 F (36.4 C)-98.3 F (36.8 C)] 98.3 F (36.8 C) (12/23 0746) Pulse Rate:  [88-156] 88 (12/23 0746) Resp:  [14-22] 22 (12/23 0746) BP: (109-143)/(57-90) 128/76 (12/23 0746) SpO2:  [95 %-99 %] 98 % (12/23 0746) Weight change:  Last BM Date : 10/02/24  PE: Appears lethargic GENERAL: Not in distress ABDOMEN: Nondistended EXTREMITIES: No deformity  Lab Results: Results for orders placed or performed during the hospital encounter of 10/01/24 (from the past 48 hours)  CBC with Differential/Platelet     Status: Abnormal   Collection Time: 10/04/24  5:31 AM  Result Value Ref Range   WBC 24.0 (H) 4.0 - 10.5 K/uL   RBC 4.18 3.87 - 5.11 MIL/uL   Hemoglobin 9.6 (L) 12.0 - 15.0 g/dL   HCT 69.6 (L) 63.9 - 53.9 %   MCV 72.5 (L) 80.0 - 100.0 fL   MCH 23.0 (L) 26.0 - 34.0 pg   MCHC 31.7 30.0 - 36.0 g/dL   RDW 74.9 (H) 88.4 - 84.4 %   Platelets 523 (H) 150 - 400 K/uL   nRBC 0.0 0.0 - 0.2 %   Neutrophils Relative % 89 %   Neutro Abs 21.4 (H) 1.7 - 7.7 K/uL   Lymphocytes Relative 5 %   Lymphs Abs 1.3 0.7 - 4.0 K/uL   Monocytes Relative 5 %   Monocytes Absolute 1.1 (H) 0.1 - 1.0 K/uL   Eosinophils Relative 0 %   Eosinophils Absolute 0.1 0.0 - 0.5 K/uL   Basophils Relative 0 %   Basophils Absolute 0.1 0.0 - 0.1 K/uL   WBC Morphology See Note     Comment: Mild Left Shift (1-5% metas, occ myelo)   RBC Morphology See Note     Comment:  POIKILOCYTOSIS   Smear Review Normal platelet morphology    Immature Granulocytes 1 %   Abs Immature Granulocytes 0.13 (H) 0.00 - 0.07 K/uL    Comment: Performed at [facility], [address] [place], [place] [address]  Protime-INR     Status: Abnormal   Collection Time: 10/04/24  5:31 AM  Result Value Ref Range    Prothrombin Time 20.4 (H) 11.4 - 15.2 seconds   INR 1.7 (H) 0.8 - 1.2    Comment: (NOTE) INR goal varies based on device and disease states. Performed at [facility], [address] [place], [place] [address]   Comprehensive metabolic panel     Status: Abnormal   Collection Time: 10/04/24  5:31 AM  Result Value Ref Range   Sodium 136 135 - 145 mmol/L   Potassium 3.7 3.5 - 5.1 mmol/L   Chloride 103 98 - 111 mmol/L   CO2 23 22 - 32 mmol/L   Glucose, Bld 84 70 - 99 mg/dL    Comment: Glucose reference range applies only to samples taken after fasting for at least 8 hours.   BUN 20 8 - 23 mg/dL   Creatinine, Ser 9.27 0.44 - 1.00 mg/dL   Calcium 9.0 8.9 - 89.6 mg/dL   Total Protein 5.3 (L) 6.5 - 8.1 g/dL   Albumin 2.1 (L) 3.5 - 5.0 g/dL   AST 68 (H) 15 - 41 U/L   ALT 15 0 - 44 U/L   Alkaline Phosphatase 285 (H) 38 - 126 U/L  Total Bilirubin 1.4 (H) 0.0 - 1.2 mg/dL   GFR, Estimated >39 >39 mL/min    Comment: (NOTE) Calculated using the CKD-EPI Creatinine Equation (2021)    Anion gap 10 5 - 15    Comment: Performed at [facility], [address] [place], [place] [address]  CBC     Status: Abnormal   Collection Time: 10/05/24  6:11 AM  Result Value Ref Range   WBC 22.9 (H) 4.0 - 10.5 K/uL   RBC 4.45 3.87 - 5.11 MIL/uL   Hemoglobin 10.2 (L) 12.0 - 15.0 g/dL   HCT 67.8 (L) 63.9 - 53.9 %   MCV 72.1 (L) 80.0 - 100.0 fL   MCH 22.9 (L) 26.0 - 34.0 pg   MCHC 31.8 30.0 - 36.0 g/dL   RDW 75.0 (H) 88.4 - 84.4 %   Platelets 510 (H) 150 - 400 K/uL   nRBC 0.0 0.0 - 0.2 %    Comment: Performed at [facility], [address], [place], [place] [address]  Comprehensive metabolic panel with GFR     Status: Abnormal   Collection Time: 10/05/24  6:11 AM  Result Value Ref Range   Sodium 139 135 - 145 mmol/L   Potassium 3.9 3.5 - 5.1 mmol/L   Chloride 103 98 - 111 mmol/L   CO2 21 (L) 22 - 32 mmol/L   Glucose, Bld 56 (L) 70 - 99 mg/dL     Comment: Glucose reference range applies only to samples taken after fasting for at least 8 hours.   BUN 26 (H) 8 - 23 mg/dL   Creatinine, Ser 9.16 0.44 - 1.00 mg/dL   Calcium 8.9 8.9 - 89.6 mg/dL   Total Protein 5.2 (L) 6.5 - 8.1 g/dL   Albumin 2.0 (L) 3.5 - 5.0 g/dL   AST 56 (H) 15 - 41 U/L   ALT 12 0 - 44 U/L   Alkaline Phosphatase 220 (H) 38 - 126 U/L   Total Bilirubin 1.9 (H) 0.0 - 1.2 mg/dL   GFR, Estimated >39 >39 mL/min    Comment: (NOTE) Calculated using the CKD-EPI Creatinine Equation (2021)    Anion gap 15 5 - 15    Comment: Performed at [facility], [address] [place], [place] [address]    Studies/Results: US  BIOPSY (LIVER) Result Date: 10/04/2024 EXAM: Ultrasound guided liver lesion biopsy COMPARISON: CT 10/01/2024 CLINICAL HISTORY: Indication / Diagnosis: Liver lesions. Additional clinical history: 254089. Previous biopsy of same target: No. Complications: No immediate complications. Conscious Sedation: 1 mg Versed  IV, 100 mcg fentanyl  IV. Sedation Technique: Under physician supervision, Versed  and fentanyl  were administered IV for moderate sedation. Pulse oximetry, heart rate, and blood pressure were continuously monitored with an independent trained observer present. Physician face-to-face sedation time: 10 minutes. Radiation dose reduction techniques were utilized as appropriate. Discussion: Time out performed including verification of patient, date of birth, procedure, and target as appropriate. Informed written consent was obtained. The site was prepared and draped using maximal sterile barrier technique including cutaneous antisepsis. The patient was positioned supine. Initial imaging was performed. Biopsy target: Organ or target location: Liver lesion. Laterality: Right hepatic lobe. Maximal diameter: 3.4 cm. Other findings: Subcapsular, representative lesion. Local anesthesia was administered. Under Ultrasound guidance, the biopsy needle was  advanced to the target and biopsy was performed. Biopsy details: Biopsy type: Core Coaxial needle: 17g Core needle device: Biopince Core needle size: 18g Number of core specimens: 3 Fine needle aspiration performed: No On-site assessment  of biopsy adequacy: No Preliminary assessment of sample adequacy: Adequate The biopsy needle was removed and a sterile dressing was applied. Tract closure: None Post-procedure imaging: Immediate post-biopsy imaging: US  Post-biopsy imaging findings: No hemorrhage or other immediate complication Contrast: Contrast agent: None. Contrast volume: 0 mL. Estimated blood loss: Less than 10 mL. Specimens sent for evaluation. IMPRESSION: 1. Technically successful biopsy of the 3.4 cm subcapsular right hepatic lobe lesion. 2. No immediate complications. Electronically signed by: Dayne Hassell MD 10/04/2024 04:30 PM EST RP Workstation: [id number]    Medications: I have reviewed the patient's current medications.  Assessment: Abdominal CAT scan suspicious for ascending colon mass with metastatic disease, status post liver biopsy Elevated CEA and CA 19-9, anemia Strong family history of colon cancer  Mild acidosis, bicarb 21, elevated BUN 26, elevated T. bili 1.9, elevated AST 56, elevated ALP 220, and elevated WBC 22.9, hemoglobin 10.2, MCV 72.1, platelet 510  Plan: Will advised to finish remaining Suprep, with give additional MiraLAX  prep. Colonoscopy rescheduled for tomorrow.  Estelita Manas, MD 10/05/2024, 10:39 AM

## 2024-10-06 ENCOUNTER — Inpatient Hospital Stay (HOSPITAL_COMMUNITY)

## 2024-10-06 DIAGNOSIS — I469 Cardiac arrest, cause unspecified: Secondary | ICD-10-CM

## 2024-10-06 DIAGNOSIS — C785 Secondary malignant neoplasm of large intestine and rectum: Secondary | ICD-10-CM

## 2024-10-06 LAB — DIC (DISSEMINATED INTRAVASCULAR COAGULATION)PANEL
D-Dimer, Quant: >20 ug{FEU}/mL — ABNORMAL HIGH (ref 0.00–0.50)
Fibrinogen: 149 mg/dL — ABNORMAL LOW (ref 210–475)
INR: 6.9 (ref 0.8–1.2)
Platelets: 96 K/uL — ABNORMAL LOW (ref 150–400)
Prothrombin Time: 62.4 s — ABNORMAL HIGH (ref 11.4–15.2)
aPTT: <22 s — ABNORMAL LOW (ref 24–36)

## 2024-10-06 LAB — COMPREHENSIVE METABOLIC PANEL WITH GFR
ALT: 11 U/L (ref 0–44)
ALT: 125 U/L — ABNORMAL HIGH (ref 0–44)
AST: 58 U/L — ABNORMAL HIGH (ref 15–41)
AST: 974 U/L — ABNORMAL HIGH (ref 15–41)
Albumin: 1.8 g/dL — ABNORMAL LOW (ref 3.5–5.0)
Albumin: <1.5 g/dL — ABNORMAL LOW (ref 3.5–5.0)
Alkaline Phosphatase: 106 U/L (ref 38–126)
Alkaline Phosphatase: 104 U/L (ref 38–126)
Anion gap: 26 — ABNORMAL HIGH (ref 5–15)
Anion gap: 36 — ABNORMAL HIGH (ref 5–15)
BUN: 33 mg/dL — ABNORMAL HIGH (ref 8–23)
BUN: 28 mg/dL — ABNORMAL HIGH (ref 8–23)
CO2: 13 mmol/L — ABNORMAL LOW (ref 22–32)
CO2: 19 mmol/L — ABNORMAL LOW (ref 22–32)
Calcium: 9.3 mg/dL (ref 8.9–10.3)
Calcium: 7.6 mg/dL — ABNORMAL LOW (ref 8.9–10.3)
Chloride: 101 mmol/L (ref 98–111)
Chloride: 101 mmol/L (ref 98–111)
Creatinine, Ser: 1.23 mg/dL — ABNORMAL HIGH (ref 0.44–1.00)
Creatinine, Ser: 1.24 mg/dL — ABNORMAL HIGH (ref 0.44–1.00)
GFR, Estimated: 45 mL/min — ABNORMAL LOW
GFR, Estimated: 45 mL/min — ABNORMAL LOW
Glucose, Bld: 56 mg/dL — ABNORMAL LOW (ref 70–99)
Glucose, Bld: 84 mg/dL (ref 70–99)
Potassium: 4.1 mmol/L (ref 3.5–5.1)
Potassium: 6.7 mmol/L (ref 3.5–5.1)
Sodium: 139 mmol/L (ref 135–145)
Sodium: 156 mmol/L — ABNORMAL HIGH (ref 135–145)
Total Bilirubin: 2.1 mg/dL — ABNORMAL HIGH (ref 0.0–1.2)
Total Bilirubin: 1.1 mg/dL (ref 0.0–1.2)
Total Protein: 5 g/dL — ABNORMAL LOW (ref 6.5–8.1)
Total Protein: <3 g/dL — ABNORMAL LOW (ref 6.5–8.1)

## 2024-10-06 LAB — BASIC METABOLIC PANEL WITH GFR
Anion gap: 27 — ABNORMAL HIGH (ref 5–15)
BUN: 33 mg/dL — ABNORMAL HIGH (ref 8–23)
CO2: 15 mmol/L — ABNORMAL LOW (ref 22–32)
Calcium: 8.5 mg/dL — ABNORMAL LOW (ref 8.9–10.3)
Chloride: 104 mmol/L (ref 98–111)
Creatinine, Ser: 1.08 mg/dL — ABNORMAL HIGH (ref 0.44–1.00)
GFR, Estimated: 53 mL/min — ABNORMAL LOW
Glucose, Bld: 226 mg/dL — ABNORMAL HIGH (ref 70–99)
Potassium: 5.1 mmol/L (ref 3.5–5.1)
Sodium: 146 mmol/L — ABNORMAL HIGH (ref 135–145)

## 2024-10-06 LAB — CBC
HCT: 32.8 % — ABNORMAL LOW (ref 36.0–46.0)
HCT: 22.4 % — ABNORMAL LOW (ref 36.0–46.0)
Hemoglobin: 10 g/dL — ABNORMAL LOW (ref 12.0–15.0)
Hemoglobin: 6.8 g/dL — CL (ref 12.0–15.0)
MCH: 22.5 pg — ABNORMAL LOW (ref 26.0–34.0)
MCH: 23.1 pg — ABNORMAL LOW (ref 26.0–34.0)
MCHC: 30.5 g/dL (ref 30.0–36.0)
MCHC: 30.4 g/dL (ref 30.0–36.0)
MCV: 73.9 fL — ABNORMAL LOW (ref 80.0–100.0)
MCV: 75.9 fL — ABNORMAL LOW (ref 80.0–100.0)
Platelets: 562 K/uL — ABNORMAL HIGH (ref 150–400)
Platelets: 294 K/uL (ref 150–400)
RBC: 4.44 MIL/uL (ref 3.87–5.11)
RBC: 2.95 MIL/uL — ABNORMAL LOW (ref 3.87–5.11)
RDW: 25.3 % — ABNORMAL HIGH (ref 11.5–15.5)
RDW: 24.9 % — ABNORMAL HIGH (ref 11.5–15.5)
WBC: 17.3 K/uL — ABNORMAL HIGH (ref 4.0–10.5)
WBC: 22.7 K/uL — ABNORMAL HIGH (ref 4.0–10.5)
nRBC: 2 % — ABNORMAL HIGH (ref 0.0–0.2)
nRBC: 4.1 % — ABNORMAL HIGH (ref 0.0–0.2)

## 2024-10-06 LAB — MAGNESIUM: Magnesium: 2.9 mg/dL — ABNORMAL HIGH (ref 1.7–2.4)

## 2024-10-06 LAB — CBC WITH DIFFERENTIAL/PLATELET
Abs Immature Granulocytes: 1.55 K/uL — ABNORMAL HIGH (ref 0.00–0.07)
Basophils Absolute: 0 K/uL (ref 0.0–0.1)
Basophils Relative: 0 %
Eosinophils Absolute: 0.1 K/uL (ref 0.0–0.5)
Eosinophils Relative: 1 %
HCT: 23.5 % — ABNORMAL LOW (ref 36.0–46.0)
Hemoglobin: 7.4 g/dL — ABNORMAL LOW (ref 12.0–15.0)
Immature Granulocytes: 14 %
Lymphocytes Relative: 23 %
Lymphs Abs: 2.6 K/uL (ref 0.7–4.0)
MCH: 25.7 pg — ABNORMAL LOW (ref 26.0–34.0)
MCHC: 31.5 g/dL (ref 30.0–36.0)
MCV: 81.6 fL (ref 80.0–100.0)
Monocytes Absolute: 0.6 K/uL (ref 0.1–1.0)
Monocytes Relative: 5 %
Neutro Abs: 6.2 K/uL (ref 1.7–7.7)
Neutrophils Relative %: 57 %
Platelets: 97 K/uL — ABNORMAL LOW (ref 150–400)
RBC: 2.88 MIL/uL — ABNORMAL LOW (ref 3.87–5.11)
RDW: 22.6 % — ABNORMAL HIGH (ref 11.5–15.5)
WBC: 11 K/uL — ABNORMAL HIGH (ref 4.0–10.5)
nRBC: 5.5 % — ABNORMAL HIGH (ref 0.0–0.2)

## 2024-10-06 LAB — COOXEMETRY PANEL
Carboxyhemoglobin: 3.8 % — ABNORMAL HIGH (ref 0.5–1.5)
Methemoglobin: 1.5 % (ref 0.0–1.5)
O2 Saturation: 65.1 %
Total hemoglobin: 8 g/dL — ABNORMAL LOW (ref 12.0–16.0)

## 2024-10-06 LAB — AMMONIA: Ammonia: 99 umol/L — ABNORMAL HIGH (ref 9–35)

## 2024-10-06 LAB — PREPARE RBC (CROSSMATCH)

## 2024-10-06 LAB — GLUCOSE, CAPILLARY
Glucose-Capillary: 49 mg/dL — ABNORMAL LOW (ref 70–99)
Glucose-Capillary: 166 mg/dL — ABNORMAL HIGH (ref 70–99)

## 2024-10-06 LAB — LACTIC ACID, PLASMA
Lactic Acid, Venous: >9 mmol/L (ref 0.5–1.9)
Lactic Acid, Venous: >9 mmol/L (ref 0.5–1.9)

## 2024-10-06 LAB — MRSA NEXT GEN BY PCR, NASAL: MRSA by PCR Next Gen: DETECTED — AB

## 2024-10-06 LAB — PATHOLOGIST SMEAR REVIEW

## 2024-10-06 LAB — SURGICAL PATHOLOGY

## 2024-10-06 SURGERY — COLONOSCOPY
Anesthesia: Monitor Anesthesia Care

## 2024-10-06 MED ORDER — VASOPRESSIN 20 UNITS/100 ML INFUSION FOR SHOCK
INTRAVENOUS | Status: AC
  Administered 2024-10-06: 0.03 [IU]/min via INTRAVENOUS
  Filled 2024-10-06: qty 100

## 2024-10-06 MED ORDER — SODIUM CHLORIDE 0.9% IV SOLUTION: Freq: Once | INTRAVENOUS | Status: AC

## 2024-10-06 MED ORDER — SODIUM CHLORIDE 0.9 % IV SOLN
250.0000 mL | INTRAVENOUS | Status: DC
  Administered 2024-10-06: 250 mL via INTRAVENOUS

## 2024-10-06 MED ORDER — SODIUM BICARBONATE 8.4 % IV SOLN: 100.0000 meq | Freq: Once | INTRAVENOUS | Status: AC

## 2024-10-06 MED ORDER — PHENYLEPHRINE HCL-NACL 20-0.9 MG/250ML-% IV SOLN
0.0000 ug/min | INTRAVENOUS | Status: DC
  Administered 2024-10-06: 400 ug/min via INTRAVENOUS
  Administered 2024-10-06: 20 ug/min via INTRAVENOUS
  Filled 2024-10-06 (×4): qty 250

## 2024-10-06 MED ORDER — SODIUM BICARBONATE 8.4 % IV SOLN
INTRAVENOUS | Status: AC
  Administered 2024-10-06: 50 meq via INTRAVENOUS
  Filled 2024-10-06: qty 100

## 2024-10-06 MED ORDER — GLYCOPYRROLATE 1 MG PO TABS: 1.0000 mg | ORAL_TABLET | ORAL | Status: DC | PRN

## 2024-10-06 MED ORDER — SODIUM BICARBONATE 8.4 % IV SOLN
INTRAVENOUS | Status: AC
  Filled 2024-10-06: qty 100

## 2024-10-06 MED ORDER — EPINEPHRINE HCL 5 MG/250ML IV SOLN IN NS
INTRAVENOUS | Status: AC
  Administered 2024-10-06: 20 ug/min via INTRAVENOUS
  Filled 2024-10-06: qty 250

## 2024-10-06 MED ORDER — HYDROCORTISONE SOD SUC (PF) 100 MG IJ SOLR: 100.0000 mg | Freq: Three times a day (TID) | INTRAMUSCULAR | Status: DC

## 2024-10-06 MED ORDER — NOREPINEPHRINE 4 MG/250ML-% IV SOLN
0.0000 ug/min | INTRAVENOUS | Status: DC
  Administered 2024-10-06: 40 ug/min via INTRAVENOUS
  Filled 2024-10-06: qty 250

## 2024-10-06 MED ORDER — FENTANYL BOLUS VIA INFUSION: 100.0000 ug | INTRAVENOUS | Status: DC | PRN

## 2024-10-06 MED ORDER — STERILE WATER FOR INJECTION IV SOLN
INTRAVENOUS | Status: DC
  Filled 2024-10-06 (×2): qty 1000

## 2024-10-06 MED ORDER — VASOPRESSIN 20 UNITS/100 ML INFUSION FOR SHOCK: 0.0000 [IU]/min | INTRAVENOUS | Status: DC

## 2024-10-06 MED ORDER — SODIUM BICARBONATE 8.4 % IV SOLN
100.0000 meq | Freq: Once | INTRAVENOUS | Status: AC
  Administered 2024-10-06: 100 meq via INTRAVENOUS

## 2024-10-06 MED ORDER — DEXTROSE 50 % IV SOLN: 1.0000 | INTRAVENOUS | Status: DC | PRN

## 2024-10-06 MED ORDER — EPINEPHRINE HCL 5 MG/250ML IV SOLN IN NS
0.5000 ug/min | INTRAVENOUS | Status: DC
  Administered 2024-10-06: 20 ug/min via INTRAVENOUS

## 2024-10-06 MED ORDER — VANCOMYCIN HCL 1250 MG/250ML IV SOLN
1250.0000 mg | Freq: Once | INTRAVENOUS | Status: DC
  Filled 2024-10-06: qty 250

## 2024-10-06 MED ORDER — NOREPINEPHRINE 4 MG/250ML-% IV SOLN
0.0000 ug/min | INTRAVENOUS | Status: DC
  Administered 2024-10-06: 0.5333 ug/min via INTRAVENOUS
  Filled 2024-10-06: qty 250

## 2024-10-06 MED ORDER — VANCOMYCIN HCL 750 MG/150ML IV SOLN: 750.0000 mg | INTRAVENOUS | Status: DC

## 2024-10-06 MED ORDER — EPINEPHRINE HCL 5 MG/250ML IV SOLN IN NS
INTRAVENOUS | Status: AC
  Filled 2024-10-06: qty 250

## 2024-10-06 MED ORDER — ACETAMINOPHEN 325 MG PO TABS: 650.0000 mg | ORAL_TABLET | Freq: Four times a day (QID) | ORAL | Status: DC | PRN

## 2024-10-06 MED ORDER — FENTANYL 2500MCG IN NS 250ML (10MCG/ML) PREMIX INFUSION: 0.0000 ug/h | INTRAVENOUS | Status: DC

## 2024-10-06 MED ORDER — GLYCOPYRROLATE 0.2 MG/ML IJ SOLN: 0.2000 mg | INTRAMUSCULAR | Status: DC | PRN

## 2024-10-06 MED ORDER — LACTATED RINGERS IV BOLUS: 1000.0000 mL | Freq: Once | INTRAVENOUS | Status: DC

## 2024-10-06 MED ORDER — ACETAMINOPHEN 650 MG RE SUPP: 650.0000 mg | Freq: Four times a day (QID) | RECTAL | Status: DC | PRN

## 2024-10-06 MED ORDER — POLYVINYL ALCOHOL 1.4 % OP SOLN: 1.0000 [drp] | Freq: Four times a day (QID) | OPHTHALMIC | Status: DC | PRN

## 2024-10-06 MED FILL — Medication: Qty: 1 | Status: AC

## 2024-10-07 LAB — BPAM RBC
Blood Product Expiration Date: 202601232359
Blood Product Expiration Date: 202601232359
ISSUE DATE / TIME: 202512240606
ISSUE DATE / TIME: 202512240718
Unit Type and Rh: 5100
Unit Type and Rh: 5100

## 2024-10-07 LAB — TYPE AND SCREEN
ABO/RH(D): O POS
Antibody Screen: NEGATIVE
Unit division: 0
Unit division: 0

## 2024-10-08 ENCOUNTER — Ambulatory Visit

## 2024-10-09 LAB — BLOOD GAS, ARTERIAL
Acid-base deficit: 22.3 mmol/L — ABNORMAL HIGH (ref 0.0–2.0)
Acid-base deficit: 12 mmol/L — ABNORMAL HIGH (ref 0.0–2.0)
Bicarbonate: 11.4 mmol/L — ABNORMAL LOW (ref 20.0–28.0)
Bicarbonate: 17.3 mmol/L — ABNORMAL LOW (ref 20.0–28.0)
Drawn by: 74501
Drawn by: 75061
FIO2: 100 %
FIO2: 100 %
MECHVT: 400 mL
MECHVT: 400 mL
O2 Saturation: 97.9 %
O2 Saturation: 98.2 %
PEEP: 8 cmH2O
PEEP: 8 cmH2O
Patient temperature: 34.2
Patient temperature: 34.9
RATE: 18 {breaths}/min
RATE: 22 {breaths}/min
pCO2 arterial: 54 mmHg — ABNORMAL HIGH (ref 32–48)
pCO2 arterial: 48 mmHg (ref 32–48)
pH, Arterial: <6.95 — CL (ref 7.35–7.45)
pH, Arterial: 7.16 — CL (ref 7.35–7.45)
pO2, Arterial: 296 mmHg — ABNORMAL HIGH (ref 83–108)
pO2, Arterial: 216 mmHg — ABNORMAL HIGH (ref 83–108)

## 2024-10-10 LAB — CULTURE, BLOOD (ROUTINE X 2)
Culture: NO GROWTH
Culture: NO GROWTH

## 2024-10-11 ENCOUNTER — Encounter: Admitting: Internal Medicine

## 2024-10-14 NOTE — Discharge Summary (Addendum)
 Physician Death Summary  Patient ID: GENESI STEFANKO MRN: [id number] DOB/AGE: January 08, 1946 79 y.o.  Admit date: 10/01/2024 Discharge date: 10/19/2024    Discharge Diagnoses:  PEA Cardiac arrest  Refractory shock with multiple pressors Lactic acidosis and renal failure Multiorgan failure Mechanical ventilation post cardiac arrest Liver metastasis with concerning for colon cancer. Mild malnutrition                                                            DEATH SUMMARY   79 y/o female with PMH for HTN, Anxiety and HLD who presented with Abd pain and found to have liver masses concerning for mets with possible ascending colon mass. She was seen by Cardiology for SVT. She had a liver biopsy 12/22. This morning she has severe dyspnea leading to PEA cardiac arrest for started breathing heavy to respiratory distress to respiratory arrest and then went pulseless with PEA cardiac arrest for 25 minutes. She was intubated. Later blood noted n OG tube. Her ICU course was complicated with severe refractory lactic acidosis, refractory shock with multiple pressors, with MAP 50s. She received multiple pushes of bicarb. Given her poor prognosis, and refractory shock after conversation with husband, he expressed he does not want to see her suffering, and she has been struggling over the last months. Decision with sister and husband to transition to comfort and avoid further interventions. Chaplain was called. Patient expired at 9:22 am with family members at bedside on 19-Oct-2024.            KEY EVENTS -cardiac arrest PEA for 25 minutes    Discharge Exam: General: severely ill, mottle skin  Neuro: off sedation, glasgow 4 CV: RRR PULM: breath sounds ventilator Extremities: no LE edema.  Vitals:   2024/10/19 0723 2024/10/19 0730 2024-10-19 0738 10/19/24 0816  BP: (!) 85/38  (!) 95/43   Pulse: 62 77 73   Resp: 19 (!) 21 (!) 22   Temp: (!) 93.6 F (34.2 C)  (!) 94.5 F (34.7 C)   TempSrc: Axillary   Axillary   SpO2:  100%  100%  Weight:      Height:         Discharge Labs  BMET Recent Labs  Lab 10/01/24 0840 10/02/24 9392 10/03/24 0639 10/04/24 0531 10/05/24 0611 10-19-2024 0316 19-Oct-2024 0421 10-19-24 0803  NA 140   < >  --  136 139 139 146* 156*  K 2.8*   < >  --  3.7 3.9 4.1 5.1 6.7*  CL 104   < >  --  103 103 101 104 101  CO2 20*   < >  --  23 21* 13* 15* 19*  GLUCOSE 104*   < >  --  84 56* 56* 226* 84  BUN 12   < >  --  20 26* 33* 33* 28*  CREATININE 0.89   < >  --  0.72 0.83 1.23* 1.08* 1.24*  CALCIUM 9.2   < >  --  9.0 8.9 9.3 8.5* 7.6*  MG 1.8  --  1.7  --   --   --  2.9*  --    < > = values in this interval not displayed.    CBC Recent Labs  Lab 19-Oct-2024 0316 2024/10/19 0421 10/19/2024 0803  HGB 10.0*  6.8* 7.4*  HCT 32.8* 22.4* 23.5*  WBC 17.3* 22.7* 11.0*  PLT 562* 294 96*  97*    Anti-Coagulation Recent Labs  Lab 10/01/24 1416 10/04/24 0531 10/27/24 0803  INR 1.3* 1.7* 6.9*    Time spent on disposition:  45 Minutes.  Patient expired at 9:22 am.    Signed: Marny Patch, MD [facility] 27-Oct-2024, 10:25 AM   Please see [email or web address] for pager details.

## 2024-10-14 NOTE — Progress Notes (Addendum)
 "                                                                                                                                                               ICU Consult    NAME:  Brandi Ferguson, MRN:  [id number], DOB:  02/19/1946, LOS: 3 ADMISSION DATE:  10/01/2024,  CONSULTATION DATE:  Oct 31, 2024 REFERRING MD:  Lynwood Kipper ACNPC-AG,  CHIEF COMPLAINT:  Consult for Post-Arrest  Brief History   79 year old female medical history significant for anxiety, hypertension, hyperlipidemia, hypothyroidism.  Admitted to the hospital through ER with abdominal pain found to have new liver lesions complicated by episodes of SVT. Patient originally denied any personal history of cancer however she has a sister who died of colon cancer and has reportedly been diligent about having screening including colonoscopies and mammograms.  Approximately 5 to 6 weeks ago patient as well as her husband stated she had a complete lack of appetite with a weight loss of about 10 pounds and intermittent bowel pain.   History of present illness   During the course of nursing care assigned RN, RN/second RN and NP witnessed a decrease in respiratory effort leading to use of BVM, followed by full arrest(PEA) and less than +/-1 minute. 2 rounds of CPR were completed to include epi and Bicarb (see code sheet for specifics) There was a return of pulse, and epi drip was started. Found to be hypoglycemic during code -corrected  CBG  Latest Reference Range & Units 31-Oct-2024 03:58  Glucose-Capillary 70 - 99 mg/dL 49 (L)  (L): Data is abnormally low  Follow up:   Latest Reference Range & Units 10-31-24 05:14  Glucose-Capillary 70 - 99 mg/dL 833 (H)  (H): Data is abnormally high    Past Medical History  Adenomatous colonic polyp Anxiety Depression Cataract (repaired 2023) Diverticulosis of colon Hyperlipidemia Hypertension Hypothyroidism IBS Lump or mass in the breast (2009)   Significant Hospital Events    Respiratory arrest/PEA cardiac arrest  Consults:  GI-Dr. Herschell CCM-Dr. Maree  Procedures:  ETT- Dr Rees/ER Physician NG tube - RN Staff A-Line - Dr Maree [facility] - Dr Maree CCM  Significant Diagnostic Tests:  Portable CXR  -pending BMP-pending CBC-pending Lactic acid -pending Mag-pending Type and screen (with 2 units ordered (if results indicate need) ABG -post-intubation pending  Micro Data:  Blood culture pending  Antimicrobials:  Zosyn  3.375g IV every 8 hours   Objective   Blood pressure 95/60, pulse 95, temperature (!) 97.3 F (36.3 C), temperature source Axillary, resp. rate (!) 41, height 5' 2 (1.575 m), weight 64.6 kg, SpO2 94%.        Intake/Output Summary (Last 24 hours) at October 31, 2024 0424 Last data filed at Oct 31, 2024 0100 Gross per 24 hour  Intake 7286.45 ml  Output 600 ml  Net 6686.45 ml   Filed Weights   10/01/24 0813 10/05/24 1230  Weight: 55.8 kg 64.6 kg    Examination: General: intubated, no sedation HENT: dry  Lungs: Vent assisted-x-ray pending Cardiovascular: Bradycardia to PEA- now NSR,, no JVD  Abdomen: soft, nontender,  Extremities: without edema Neuro: unable to follow commands   Resolved Hospital Problem list   PEA - Return of pulse Hypoglycemia- corrected  Assessment & Plan:  Respiratory to Cardiac Arrest   Upper GI/GI Bleed       Intubating Physician noted blood in upper airway   Shock     2 units of blood ordered   Lactic acidosis   Further: CCM consult completed Maintain pressors as needed  Best practice:  Diet: N.p.o. Pain/Anxiety/Delirium protocol  VAP protocol   Glucose control: Every 4 CBG Code Status: Full Family Communication: Family en-route/ bedside 0630 hrs  Disposition: ICU  Labs   CBC: Recent Labs  Lab 10/01/24 0840 10/02/24 0607 10/04/24 0531 10/05/24 0611  WBC 18.6* 18.9* 24.0* 22.9*  NEUTROABS  --   --  21.4*  --   HGB 10.1* 8.6* 9.6* 10.2*  HCT 32.1* 27.6* 30.3* 32.1*  MCV  72.0* 73.8* 72.5* 72.1*  PLT 631* 509* 523* 510*    Basic Metabolic Panel: Recent Labs  Lab 10/01/24 0840 10/02/24 0607 10/03/24 0639 10/04/24 0531 10/05/24 0611  NA 140 138  --  136 139  K 2.8* 3.6  --  3.7 3.9  CL 104 104  --  103 103  CO2 20* 23  --  23 21*  GLUCOSE 104* 100*  --  84 56*  BUN 12 12  --  20 26*  CREATININE 0.89 1.12*  --  0.72 0.83  CALCIUM 9.2 8.5*  --  9.0 8.9  MG 1.8  --  1.7  --   --    GFR: Estimated Creatinine Clearance: 50.9 mL/min (by C-G formula based on SCr of 0.83 mg/dL). Recent Labs  Lab 10/01/24 0840 10/01/24 1256 10/01/24 1532 10/02/24 0607 10/04/24 0531 10/05/24 0611 10/05/24 1341  PROCALCITON  --   --   --   --   --   --  2.44  WBC 18.6*  --   --  18.9* 24.0* 22.9*  --   LATICACIDVEN  --  2.7* 2.5*  --   --   --   --     Liver Function Tests: Recent Labs  Lab 10/01/24 0840 10/02/24 0607 10/04/24 0531 10/05/24 0611  AST 112* 61* 68* 56*  ALT 30 19 15 12   ALKPHOS 374* 220* 285* 220*  BILITOT 1.3* 1.4* 1.4* 1.9*  PROT 7.1 5.6* 5.3* 5.2*  ALBUMIN 3.3* 2.5* 2.1* 2.0*   Recent Labs  Lab 10/01/24 0840  LIPASE 25   Recent Labs  Lab 10/05/24 1052 11/01/24 0316  AMMONIA 70* 99*    ABG No results found for: PHART, PCO2ART, PO2ART, HCO3, TCO2, ACIDBASEDEF, O2SAT   Coagulation Profile: Recent Labs  Lab 10/01/24 1416 10/04/24 0531  INR 1.3* 1.7*    Cardiac Enzymes: No results for input(s): CKTOTAL, CKMB, CKMBINDEX, TROPONINI in the last 168 hours.  HbA1C: Hgb A1c MFr Bld  Date/Time Value Ref Range Status  07/06/2019 09:22 AM 4.9 4.6 - 6.5 % Final    Comment:    Glycemic Control Guidelines for People with Diabetes:Non Diabetic:  <6%Goal of Therapy: <7%Additional Action Suggested:  >8%   07/02/2018 08:46 AM 4.8 4.6 - 6.5 % Final  Comment:    Glycemic Control Guidelines for People with Diabetes:Non Diabetic:  <6%Goal of Therapy: <7%Additional Action Suggested:  >8%     CBG: Recent Labs   Lab 10/05/24 1207 Oct 12, 2024 0358  GLUCAP 80 49*    Review of Systems:   unable to obtain  Past Medical History  She,  has a past medical history of 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   Surgical History    Past Surgical History:  Procedure Laterality Date   ABDOMINAL HYSTERECTOMY  May 2011   Total hysterectomy   BREAST EXCISIONAL BIOPSY Right 1984   BREAST SURGERY  1984   Right lumpectomy- Benign   CERVICAL CONE BIOPSY  1977   EYE SURGERY  Biopsy lt eye 2020   Cataract surgery on both eyes 04/2022   LAPAROSCOPY  1972   Fertility work-up   SKIN LESION EXCISION  2006   Left breast   TONSILLECTOMY       Social History   reports that she has never smoked. She has never used smokeless tobacco. She reports that she does not drink alcohol  and does not use drugs.   Family History   Her family history includes Alcohol  abuse in her father; Alzheimer's disease in her mother; Cancer in her sister; Coronary artery disease in her father; Diabetes in her father; Hyperlipidemia in her father; Hypertension in her father.   Allergies Allergies[1]   Home Medications  Prior to Admission medications  Medication Sig Start Date End Date Taking? Authorizing Provider  ALPRAZolam  (XANAX ) 0.5 MG tablet Take 1 tablet (0.5 mg total) by mouth 3 (three) times daily as needed. Patient taking differently: Take 0.5 mg by mouth in the morning, at noon, and at bedtime. 04/21/24  Yes Rollene Almarie LABOR, MD  hydrochlorothiazide  (HYDRODIURIL ) 25 MG tablet Take 0.5 tablets (12.5 mg total) by mouth daily. 09/08/23  Yes Rollene Almarie LABOR, MD  levothyroxine  (SYNTHROID ) 88 MCG tablet Take 1 tablet (88 mcg total) by mouth daily before breakfast. 06/03/24  Yes Rollene Almarie LABOR, MD  lovastatin  (MEVACOR ) 40 MG tablet TAKE 1 TABLET BY MOUTH EVERYDAY AT BEDTIME Patient taking differently: Take 40 mg by mouth at bedtime. 03/17/24  Yes Rollene Almarie LABOR, MD  pantoprazole  (PROTONIX ) 40 MG tablet Take 1 tablet (40 mg total) by mouth daily. 09/14/24  Yes Rollene Almarie LABOR, MD  polyethylene glycol powder (GLYCOLAX /MIRALAX ) 17 GM/SCOOP powder Take 17 g by mouth 2 (two) times daily as needed for moderate constipation. 04/21/24  Yes Rollene Almarie LABOR, MD  traZODone  (DESYREL ) 50 MG tablet Take 1 tablet (50 mg total) by mouth at bedtime. 09/14/24  Yes Rollene Almarie LABOR, MD        Update  [id number] Hrs  Latest Reference Range & Units 12-Oct-2024 04:21  Lactic Acid, Venous 0.5 - 1.9 mmol/L >9.0 (HH)  WBC 4.0 - 10.5 K/uL 22.7 (H)  RBC 3.87 - 5.11 MIL/uL 2.95 (L)  Hemoglobin 12.0 - 15.0 g/dL 6.8 (LL)  HCT 63.9 - 53.9 % 22.4 (L)  MCV 80.0 - 100.0 fL 75.9 (L)  MCH 26.0 - 34.0 pg 23.1 (L)  MCHC 30.0 - 36.0 g/dL 69.5  RDW 88.4 - 84.4 % 24.9 (H)  Platelets 150 - 400 K/uL 294  nRBC 0.0 - 0.2 % 4.1 (H)  (HH): Data  is critically high (LL): Data is critically low (H): Data is abnormally high (L): Data is abnormally low  2 units of PRBCs ordered - #1 is infusing at this time    Lynwood Kipper MSNA MSN ACNPC-AG Acute Care Nurse Practitioner [facility]     [1] No Known Allergies  "

## 2024-10-14 NOTE — Procedures (Signed)
 Central Venous Catheter Insertion Procedure Note  SONA NATIONS  [id number]  04/13/1946  Date:24-Oct-2024  Time:6:06 AM   Provider Performing:Sania Noy Maree   Procedure: Insertion of Non-tunneled Central Venous [phone]) with US  guidance (23062)   Indication(s) Medication administration  Consent Emergent, not able to get consent  Anesthesia Topical only with 1% lidocaine    Timeout Verified patient identification, verified procedure, site/side was marked, verified correct patient position, special equipment/implants available, medications/allergies/relevant history reviewed, required imaging and test results available.  Sterile Technique Maximal sterile technique including full sterile barrier drape, hand hygiene, sterile gown, sterile gloves, mask, hair covering, sterile ultrasound probe cover (if used).  Procedure Description Area of catheter insertion was cleaned with chlorhexidine  and draped in sterile fashion.  With real-time ultrasound guidance a central venous catheter was placed into the left subclavian vein. Nonpulsatile blood flow and easy flushing noted in all ports.  The catheter was sutured in place and sterile dressing applied.  Complications/Tolerance Several attempts at Left internal jugular, but not able to get wire through.  Then attempted left subclavian and 2-3 attempts before wire was able to be passed through. Chest X-ray is ordered to verify placement for internal jugular or subclavian cannulation.   Chest x-ray is not ordered for femoral cannulation.  EBL Minimal  Specimen(s) None

## 2024-10-14 NOTE — Progress Notes (Signed)
 Left message for emergency contact to call after code blue.

## 2024-10-14 NOTE — Procedures (Signed)
 Arterial Catheter Insertion Procedure Note  Brandi Ferguson  [id number]  December 28, 1945  Date:Oct 22, 2024  Time:6:09 AM    Provider Performing: Orlin Fairly    Procedure: Insertion of Arterial Line (63379) with US  guidance (23062)   Indication(s) Blood pressure monitoring and/or need for frequent ABGs  Consent Unable to obtain consent due to emergent nature of procedure.  Anesthesia 1% local Lidocaine    Time Out Verified patient identification, verified procedure, site/side was marked, verified correct patient position, special equipment/implants available, medications/allergies/relevant history reviewed, required imaging and test results available.   Sterile Technique Maximal sterile technique including full sterile barrier drape, hand hygiene, sterile gown, sterile gloves, mask, hair covering, sterile ultrasound probe cover (if used).   Procedure Description Area of catheter insertion was cleaned with chlorhexidine  and draped in sterile fashion. With real-time ultrasound guidance an arterial catheter was placed into the right femoral artery.  Appropriate arterial tracings confirmed on monitor.     Complications/Tolerance There were several attempts, very small vessel, difficult to find, poor blood flow   EBL Minimal   Specimen(s) None

## 2024-10-14 NOTE — Code Documentation (Signed)
.  CODE BLUE NOTE  Patient Name: Brandi Ferguson   MRN: [id number]   Date of Birth/ Sex: November 21, 1945 , female      Admission Date: 10/01/2024  Attending Provider: Evonnie Lenis, MD  Primary Diagnosis: Metastatic cancer to liver [facility])    Indication: Pt was in her usual state of health until this PM, when she was noted to be respiratory distress followed by respiratory arrest and developed cardiac arrest.  Initial rhythm was PEA.  The bedside patient found to have coffee-ground emesis.   Code blue was subsequently called. At the time of arrival on scene, ACLS protocol was underway.    Technical Description:  - CPR performance duration:  25 minutes  - Was defibrillation or cardioversion used? No   - Was external pacer placed? No  - Was patient intubated pre/post CPR? Yes    Medications Administered: Y = Yes; Blank = No Amiodarone     Atropine    Calcium    Epinephrine   3 doses followed by epinephrine  drip  Lidocaine     Magnesium     Norepinephrine     Phenylephrine     Sodium bicarbonate   2 doses  Vasopressin         Post CPR evaluation:  - Final Status - Was patient successfully resuscitated ? Yes - What is current rhythm?  Sinus rhythm - What is current hemodynamic status?  Stable  Miscellaneous Information:  - Labs sent, including: CBC, CMP, lactic acid, ABG and ammonia.  Also 1 unit of blood transfusion has been ordered.  - Primary team notified?  No.    - Family Notified? Unable to reach patient's husband which is listed emergency contact.  Left brief voicemail.  - Additional notes/ transfer status: Transferring to ICU.        Reinhold Rickey, MD  10-20-2024, 4:50 AM

## 2024-10-14 NOTE — Progress Notes (Addendum)
 Pharmacy Antibiotic Note  Brandi Ferguson is a 79 y.o. female admitted on 10/01/2024 with abdominal pain and concern for liver carcinoma. Patient developed sepsis and multiorgan failure and is s/p intubation overnight on 12/24, now requiring pressors. Patient currently being managed with zosyn . MRSA PCR resulted as positive; thus, pharmacy has been consulted for vancomycin  dosing. - mild AKI noted: 0.83 >> 1.08; afebrile, WBC elevated, LA >9  Plan: Vancomycin  1250mg  IV x1, followed by 750mg  IV q24h (eAUC 481.5, Scr 1.08, IBW) Monitor renal function, clinical status, fever curve  Follow up cultures, ability to de-escalate as appropriate   Height: 5' 2 (157.5 cm) Weight: 64.6 kg (142 lb 6.7 oz) IBW/kg (Calculated) : 50.1  Temp (24hrs), Avg:96.3 F (35.7 C), Min:93.6 F (34.2 C), Max:99.3 F (37.4 C)  Recent Labs  Lab 10/01/24 1256 10/01/24 1532 10/02/24 0607 10/04/24 0531 10/05/24 0611 11-02-24 0316 2024/11/02 0421  WBC  --   --  18.9* 24.0* 22.9* 17.3* 22.7*  CREATININE  --   --  1.12* 0.72 0.83 1.23* 1.08*  LATICACIDVEN 2.7* 2.5*  --   --   --   --  >9.0*    Estimated Creatinine Clearance: 39.1 mL/min (A) (by C-G formula based on SCr of 1.08 mg/dL (H)).    Allergies[1]  Antimicrobials this admission: Zosyn  12/19-12/20; 12/23 >> Vanc 12/24 >>   Dose adjustments this admission: None  Microbiology results: 12/23 MRSA PCR: pos  12/23 bcx:   Thank you for allowing pharmacy to be a part of this patients care.  Brandi Ferguson 11/02/24 7:56 AM     [1] No Known Allergies

## 2024-10-14 NOTE — ED Provider Notes (Signed)
 Date/Time: Oct 27, 2024 7:53 AM  Performed by: Griselda Norris, MDOxygen Delivery Method: Ambu bag Ventilation: Mask ventilation without difficulty Tube size: 7.5 mm Number of attempts: 1 Airway Equipment and Method: Video-laryngoscopy Placement Confirmation: ETT inserted through vocal cords under direct vision, Positive ETCO2, CO2 detector and Breath sounds checked- equal and bilateral Tube secured with: ETT holder     Called to bedside for CODE BLUE.  Patient with PEA arrest thought to be respiratory driven.  CPR was being managed by hospitalist.  During CPR intubation was performed and a large amount of blood was found in both her airway and posterior oropharynx, a portion of this did appear to be GI in origin.  Post intubation chest x-ray did appear to have ET tube at the level of carina, discussed that respiratory and following team will need to retract and reassess.  Hospitalist took over ongoing resuscitation after return of circulation and pending ICU arrival.   Griselda Norris, MD 2024/10/27 [phone]

## 2024-10-14 NOTE — Progress Notes (Signed)
" °   2024/10/11 0840  Spiritual Encounters  Type of Visit Initial  Care provided to: Pt and family  Conversation partners present during encounter Nurse  Referral source Family;Nurse (RN/NT/LPN)  Reason for visit Patient death  OnCall Visit No  Interventions  Spiritual Care Interventions Made Established relationship of care and support;Compassionate presence;Reflective listening;Normalization of emotions;Narrative/life review;Supported grief process;Prayer;Self-care teaching    Chaplain responded to unit page - EOL. Husband of 42 years, Brandi Ferguson, sister Brandi Ferguson, daughter Brandi Ferguson bedside.  Per their request, I prayed for and provided a blessing over pt Brandi Ferguson according to her [employer] faith. I invited family to share some memories of their time with Brandi Ferguson, and to share with Brandi Ferguson their own love and support for her.   Additional emotional support provided via compassionate presence and grief education.  Family supporting each other well. All are aware that chaplains continue to remain available as needs arise. "

## 2024-10-14 NOTE — Consult Note (Signed)
 "  NAME:  Brandi Ferguson, MRN:  [id number], DOB:  Jan 17, 1946, LOS: 3 ADMISSION DATE:  10/01/2024, CONSULTATION DATE:  10-11-2024 REFERRING MD:  Lynwood Kipper, NP, CHIEF COMPLAINT:  Cardiac Arrest   History of Present Illness:  79 y/o female with PMH for HTN, Anxiety and HLD who presented with Abd pain and found to have liver masses concerning for mets with possible ascending colon mass.  GI following. She was seen by Cardiology for SVT.   She had a liver biopsy 12/22.  This morning she started breathing heavy  to respiratory distress to respiratory arrest and then went pulseless with PEA cardiac arrest.  Code blue called and CPR started right away with ROSC.  She was intubated and during intubation blood noted in the airway.  Later blood noted n OG tube.  Pertinent  Medical History  HTN, Anxiety and HLD  Significant Hospital Events: Including procedures, antibiotic start and stop dates in addition to other pertinent events   2024-10-11: post cardiac arrest  Interim History / Subjective:  N/a  Objective    Blood pressure (!) 104/44, pulse (!) 107, temperature (!) 97.3 F (36.3 C), temperature source Axillary, resp. rate 18, height 5' 2 (1.575 m), weight 64.6 kg, SpO2 100%.    Vent Mode: PRVC FiO2 (%):  [100 %] 100 % Set Rate:  [18 bmp] 18 bmp Vt Set:  [400 mL] 400 mL PEEP:  [8 cmH20] 8 cmH20   Intake/Output Summary (Last 24 hours) at 10-11-2024 0550 Last data filed at 10/11/2024 0100 Gross per 24 hour  Intake 7286.45 ml  Output 600 ml  Net 6686.45 ml   Filed Weights   10/01/24 0813 10/05/24 1230  Weight: 55.8 kg 64.6 kg    Examination: General: NAD, not responisve HENT: pupils are fixed and dilated Lungs: clear but diminished lung sunds Cardiovascular: reg, diminished heart sounds Abdomen: distended but not firm Extremities: no edema, cyanosis Neuro: unresponsive GU: n/a  Resolved problem list   Assessment and Plan  PEA cardiac arrest Intubated On multiple  pressors Likely GI bleed There is some coffee grounds but not enough blood externally to suggest the amount of blood loss CTA GI stat when stabilizes Shock, likely hemorrhagic H/H now 6.8 To get 1 unit PRBC and will transfuse more as available Monitor H/H On 2 vasopressors-Epinephrine  , Levophed  and Phenylephrine  Lactic acidosis Post code Await ABG Gave IVP Bicarb, will add drip Possible metastatic colon cancer Prognosis guarded I spoke to Husband, who is on his way in to the hospital   Labs   CBC: Recent Labs  Lab 10/02/24 0607 10/04/24 0531 10/05/24 0611 2024/10/11 0316 Oct 11, 2024 0421  WBC 18.9* 24.0* 22.9* 17.3* 22.7*  NEUTROABS  --  21.4*  --   --   --   HGB 8.6* 9.6* 10.2* 10.0* 6.8*  HCT 27.6* 30.3* 32.1* 32.8* 22.4*  MCV 73.8* 72.5* 72.1* 73.9* 75.9*  PLT 509* 523* 510* 562* 294    Basic Metabolic Panel: Recent Labs  Lab 10/01/24 0840 10/02/24 0607 10/03/24 0639 10/04/24 0531 10/05/24 0611 10-11-24 0316  NA 140 138  --  136 139 139  K 2.8* 3.6  --  3.7 3.9 4.1  CL 104 104  --  103 103 101  CO2 20* 23  --  23 21* 13*  GLUCOSE 104* 100*  --  84 56* 56*  BUN 12 12  --  20 26* 33*  CREATININE 0.89 1.12*  --  0.72 0.83 1.23*  CALCIUM 9.2  8.5*  --  9.0 8.9 9.3  MG 1.8  --  1.7  --   --   --    GFR: Estimated Creatinine Clearance: 34.3 mL/min (A) (by C-G formula based on SCr of 1.23 mg/dL (H)). Recent Labs  Lab 10/01/24 1256 10/01/24 1532 10/02/24 0607 10/04/24 0531 10/05/24 0611 10/05/24 1341 28-Oct-2024 0316 10/28/2024 0421  PROCALCITON  --   --   --   --   --  2.44  --   --   WBC  --   --    < > 24.0* 22.9*  --  17.3* 22.7*  LATICACIDVEN 2.7* 2.5*  --   --   --   --   --   --    < > = values in this interval not displayed.    Liver Function Tests: Recent Labs  Lab 10/01/24 0840 10/02/24 0607 10/04/24 0531 10/05/24 0611 Oct 28, 2024 0316  AST 112* 61* 68* 56* 58*  ALT 30 19 15 12 11   ALKPHOS 374* 220* 285* 220* 106  BILITOT  1.3* 1.4* 1.4* 1.9* 2.1*  PROT 7.1 5.6* 5.3* 5.2* 5.0*  ALBUMIN 3.3* 2.5* 2.1* 2.0* 1.8*   Recent Labs  Lab 10/01/24 0840  LIPASE 25   Recent Labs  Lab 10/05/24 1052 10-28-2024 0316  AMMONIA 70* 99*    ABG No results found for: PHART, PCO2ART, PO2ART, HCO3, TCO2, ACIDBASEDEF, O2SAT   Coagulation Profile: Recent Labs  Lab 10/01/24 1416 10/04/24 0531  INR 1.3* 1.7*    Cardiac Enzymes: No results for input(s): CKTOTAL, CKMB, CKMBINDEX, TROPONINI in the last 168 hours.  HbA1C: Hgb A1c MFr Bld  Date/Time Value Ref Range Status  07/06/2019 09:22 AM 4.9 4.6 - 6.5 % Final    Comment:    Glycemic Control Guidelines for People with Diabetes:Non Diabetic:  <6%Goal of Therapy: <7%Additional Action Suggested:  >8%   07/02/2018 08:46 AM 4.8 4.6 - 6.5 % Final    Comment:    Glycemic Control Guidelines for People with Diabetes:Non Diabetic:  <6%Goal of Therapy: <7%Additional Action Suggested:  >8%     CBG: Recent Labs  Lab 10/05/24 1207 2024/10/28 0358 10/28/24 0514  GLUCAP 80 49* 166*    Review of Systems:   unresponsive  Past Medical History:  She,  has a past medical history of 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   Surgical History:   Past Surgical History:  Procedure Laterality Date   ABDOMINAL HYSTERECTOMY  May 2011   Total hysterectomy   BREAST EXCISIONAL BIOPSY Right 1984   BREAST SURGERY  1984   Right lumpectomy- Benign   CERVICAL CONE BIOPSY  1977   EYE SURGERY  Biopsy lt eye 2020   Cataract surgery on both eyes 04/2022   LAPAROSCOPY  1972   Fertility  work-up   SKIN LESION EXCISION  2006   Left breast   TONSILLECTOMY       Social History:   reports that she has never smoked. She has never used smokeless tobacco. She reports that she does not drink alcohol  and does not use drugs.   Family History:  Her family history includes Alcohol  abuse in her father; Alzheimer's disease in her mother; Cancer in her sister; Coronary artery disease in her father; Diabetes in her father; Hyperlipidemia in  her father; Hypertension in her father.   Allergies Allergies[1]   Home Medications  Prior to Admission medications  Medication Sig Start Date End Date Taking? Authorizing Provider  ALPRAZolam  (XANAX ) 0.5 MG tablet Take 1 tablet (0.5 mg total) by mouth 3 (three) times daily as needed. Patient taking differently: Take 0.5 mg by mouth in the morning, at noon, and at bedtime. 04/21/24  Yes Rollene Almarie LABOR, MD  hydrochlorothiazide  (HYDRODIURIL ) 25 MG tablet Take 0.5 tablets (12.5 mg total) by mouth daily. 09/08/23  Yes Rollene Almarie LABOR, MD  levothyroxine  (SYNTHROID ) 88 MCG tablet Take 1 tablet (88 mcg total) by mouth daily before breakfast. 06/03/24  Yes Rollene Almarie LABOR, MD  lovastatin  (MEVACOR ) 40 MG tablet TAKE 1 TABLET BY MOUTH EVERYDAY AT BEDTIME Patient taking differently: Take 40 mg by mouth at bedtime. 03/17/24  Yes Rollene Almarie LABOR, MD  pantoprazole  (PROTONIX ) 40 MG tablet Take 1 tablet (40 mg total) by mouth daily. 09/14/24  Yes Rollene Almarie LABOR, MD  polyethylene glycol powder (GLYCOLAX /MIRALAX ) 17 GM/SCOOP powder Take 17 g by mouth 2 (two) times daily as needed for moderate constipation. 04/21/24  Yes Rollene Almarie LABOR, MD  traZODone  (DESYREL ) 50 MG tablet Take 1 tablet (50 mg total) by mouth at bedtime. 09/14/24  Yes Rollene Almarie LABOR, MD     Critical care time: 33   The patient is critically ill with multiple organ system failure and requires high complexity decision making for assessment and support, frequent  evaluation and titration of therapies, advanced monitoring, review of radiographic studies and interpretation of complex data.   Critical Care Time devoted to patient care services, exclusive of separately billable procedures, described in this note is 35 minutes.   Orlin Fairly, MD [facility] See Amion for pager  If no response to pager , please call [phone] until 7pm After 7:00 pm call Elink  [phone] 11-05-24, 5:51 AM            [1] No Known Allergies  "

## 2024-10-14 NOTE — Progress Notes (Signed)
 Responded to code blue announcement. Pt w/ 2 piv access. Resuscitative measures still ongoing.

## 2024-10-14 NOTE — Procedures (Signed)
 Extubation Procedure Note  Patient Details:   Name: Brandi Ferguson DOB: 04/01/1946 MRN: [id number]   Airway Documentation:    Vent end date: 11-04-24 Vent end time: 0920   Evaluation Complications: No apparent complications Patient did tolerate procedure well. Bilateral Breath Sounds: Diminished   No  Burtis GORMAN Hoyer 11-04-2024, 9:42 AM

## 2024-10-14 NOTE — Progress Notes (Signed)
 "  NAME:  Brandi ELIZARDO, MRN:  [id number], DOB:  05-04-1946, LOS: 3 ADMISSION DATE:  10/01/2024, CONSULTATION DATE:  21-Oct-2024 REFERRING MD:  Lynwood Kipper, NP, CHIEF COMPLAINT:  Cardiac Arrest   History of Present Illness:  79 y/o female with PMH for HTN, Anxiety and HLD who presented with Abd pain and found to have liver masses concerning for mets with possible ascending colon mass.  GI following. She was seen by Cardiology for SVT.   She had a liver biopsy 12/22.  This morning she started breathing heavy  to respiratory distress to respiratory arrest and then went pulseless with PEA cardiac arrest.  Code blue called and CPR started right away with ROSC.  She was intubated and during intubation blood noted in the airway.  Later blood noted n OG tube.  Pertinent  Medical History  HTN, Anxiety and HLD  Significant Hospital Events: Including procedures, antibiotic start and stop dates in addition to other pertinent events   12/24: post cardiac arrest > progressive multisystem organ failure maxed on 4 pressors   Interim History / Subjective:  Unresponsive on vent, no continuous sedation  Spouse at bedside and updated   Objective    Blood pressure (!) 87/66, pulse 67, temperature (!) 93.6 F (34.2 C), temperature source Axillary, resp. rate 18, height 5' 2 (1.575 m), weight 64.6 kg, SpO2 100%.    Vent Mode: PRVC FiO2 (%):  [100 %] 100 % Set Rate:  [18 bmp] 18 bmp Vt Set:  [400 mL] 400 mL PEEP:  [8 cmH20] 8 cmH20 Plateau Pressure:  [30 cmH20] 30 cmH20   Intake/Output Summary (Last 24 hours) at 2024/10/21 0708 Last data filed at Oct 21, 2024 0100 Gross per 24 hour  Intake 7286.45 ml  Output --  Net 7286.45 ml   Filed Weights   10/01/24 0813 10/05/24 1230  Weight: 55.8 kg 64.6 kg    Examination: General: Acute on chronically ill appearing elderly female lying in bed on mechanical ventilation, in NAD HEENT: ETT, MM pink/moist, PERRL,  Neuro: Unresponsive on vent, no  continuous sedation  CV: s1s2 regular rate and rhythm, no murmur, rubs, or gallops,  PULM:  Bilateral rhonchi, tolerating vent, coffee ground emesis on face and sheets  GI: soft, bowel sounds active in all 4 quadrants, non-tender, non-distended Extremities: warm/dry, no edema  Skin: no rashes or lesions  Resolved problem list   Assessment and Plan  In hospital PEA cardiac arrest -Per chart it appears this was proceeded by a respiratory arrest with observed coffee ground emesis, high suspicion for aspiration event. Estimated down time 25 mins P: Continuous telemetry  Continue pressors for MAP goal >65 as able  Repeat CMP Optimize electrolytes   Likely GI bleed -Seen with coffee ground emesis but no other outwards signs of bleeding. Hgb dropped from 10.0 to 6.8 P: Aggressive resuscitation to hemodynamic stability Continue pressor support for MAP goal of > 65 Trend CBC  Identify and treat course  Emergent release PRBC transfusing now GI following  NPO  Maintain central access  Consider CTA ABD if stabilizes   Multifactorial shock with progressive multisystem organ failure with lactic acidosis  -Likely a combo of septic and hemorrhagic shock  P: Vent support as belove  Continue Zosyn , add vancomycin   Pressors for MAP< 65 (currently maxed on 4 Trend lactic acid Procalcitonin elevated  Monitor urine output  Acute Kidney Injury  -in the setting of above  Open anion gap metabolic acidosis  P: Follow renal function  Monitor  urine output Trend Bmet Avoid nephrotoxins Ensure adequate renal perfusion  Bicarb drip   Possible metastatic colon cancer -ABD CT suspicion for ascending colon mass with med to the liver with strong family history of colon cancer. Core live biopsy completed 12/22  P: Has been too unstable for colonoscopy  GI following  Ongong GOC   Essential hypertension  Hyperlipidemia P: Hold home medications given shock state as above   Hyperglycemia   P: SSI  CBG checks q4  CBG goal 140-180  Elevated ammonia  P: Trend  Hold starting lactulose at this time given concern for GI bleed  GOC  Given worsening multisystem organ failure GOC spouse was updated at beside. He was encouraged to contact family today given worsening condition    Critical care time  CRITICAL CARE Performed by: Iden Stripling D. Harris   Total critical care time: Additional 30 mins critical care time  minutes  Critical care time was exclusive of separately billable procedures and treating other patients.  Critical care was necessary to treat or prevent imminent or life-threatening deterioration.  Critical care was time spent personally by me on the following activities: development of treatment plan with patient and/or surrogate as well as nursing, discussions with consultants, evaluation of patient's response to treatment, examination of patient, obtaining history from patient or surrogate, ordering and performing treatments and interventions, ordering and review of laboratory studies, ordering and review of radiographic studies, pulse oximetry and re-evaluation of patient's condition.  Roxene Alviar D. Harris, NP-C [facility] Personal contact information can be found on Amion  If no contact or response made please call [phone], 8:03 AM           "

## 2024-10-14 NOTE — Progress Notes (Signed)
 PT Cancellation Note  Patient Details Name: Brandi Ferguson MRN: [id number] DOB: Dec 23, 1945   Cancelled Treatment:    Reason Eval/Treat Not Completed: Medical issues which prohibited therapy Patient now on ventilator.  Will follow. Darice Potters PT Acute Rehabilitation Services Office [phone]   Potters Darice Norris 2024-10-07, 8:29 AM

## 2024-10-14 DEATH — deceased

## 2024-10-18 ENCOUNTER — Telehealth: Payer: Self-pay

## 2024-10-18 NOTE — Telephone Encounter (Signed)
 Copied from CRM [id number]. Topic: General - Other >> Oct 18, 2024  1:41 PM China J wrote: Reason for CRM: The patient's spouse is needed access to her medication records since her MyChart is shut down to her passing away. He is wondering if we would be able to send him the results of her biopsy.   If possible, he would like if an email could be sent to [email or web address]  He is wanting a voicemail left stating that it has been sent.

## 2024-10-21 ENCOUNTER — Inpatient Hospital Stay: Admitting: Hematology

## 2024-10-25 NOTE — Telephone Encounter (Signed)
 He will likely need to go through medical records release process I do not think we can release this to him directly like that. Clotilda can you help?

## 2024-11-03 ENCOUNTER — Encounter: Payer: Self-pay | Admitting: *Deleted

## 2024-11-03 NOTE — Progress Notes (Signed)
 Brandi Ferguson                                          MRN: [id number]   11/03/2024   The [facility] Quality Team Specialist reviewed this patient medical record for the purposes of chart review for care gap closure. The following were reviewed: abstraction for care gap closure-controlling blood pressure.    [facility] Quality Team

## 2025-10-12 ENCOUNTER — Encounter: Admitting: Internal Medicine

## 2025-10-12 ENCOUNTER — Ambulatory Visit
